# Patient Record
Sex: Female | Born: 1965
Health system: Southern US, Community
[De-identification: ages and names within clinical notes are randomized; demographics above are authoritative.]

## PROBLEM LIST (undated history)

## (undated) DIAGNOSIS — E7800 Pure hypercholesterolemia, unspecified: Secondary | ICD-10-CM

## (undated) DIAGNOSIS — I10 Essential (primary) hypertension: Secondary | ICD-10-CM

## (undated) DIAGNOSIS — M1990 Unspecified osteoarthritis, unspecified site: Secondary | ICD-10-CM

## (undated) DIAGNOSIS — M23305 Other meniscus derangements, unspecified medial meniscus, unspecified knee: Secondary | ICD-10-CM

## (undated) DIAGNOSIS — D510 Vitamin B12 deficiency anemia due to intrinsic factor deficiency: Secondary | ICD-10-CM

## (undated) DIAGNOSIS — M069 Rheumatoid arthritis, unspecified: Secondary | ICD-10-CM

## (undated) DIAGNOSIS — G039 Meningitis, unspecified: Secondary | ICD-10-CM

## (undated) DIAGNOSIS — E669 Obesity, unspecified: Secondary | ICD-10-CM

## (undated) DIAGNOSIS — K760 Fatty (change of) liver, not elsewhere classified: Secondary | ICD-10-CM

## (undated) DIAGNOSIS — J45909 Unspecified asthma, uncomplicated: Secondary | ICD-10-CM

## (undated) DIAGNOSIS — D649 Anemia, unspecified: Secondary | ICD-10-CM

## (undated) DIAGNOSIS — N182 Chronic kidney disease, stage 2 (mild): Secondary | ICD-10-CM

## (undated) DIAGNOSIS — M2550 Pain in unspecified joint: Secondary | ICD-10-CM

## (undated) DIAGNOSIS — E538 Deficiency of other specified B group vitamins: Secondary | ICD-10-CM

## (undated) DIAGNOSIS — E559 Vitamin D deficiency, unspecified: Secondary | ICD-10-CM

## (undated) HISTORY — DX: Unspecified osteoarthritis, unspecified site: M19.90

## (undated) HISTORY — DX: Other meniscus derangements, unspecified medial meniscus, unspecified knee: M23.305

## (undated) HISTORY — DX: Unspecified asthma, uncomplicated: J45.909

## (undated) HISTORY — DX: Chronic kidney disease, stage 2 (mild): N18.2

## (undated) HISTORY — DX: Vitamin B12 deficiency anemia due to intrinsic factor deficiency: D51.0

## (undated) HISTORY — DX: Essential (primary) hypertension: I10

## (undated) HISTORY — DX: Pain in unspecified joint: M25.50

## (undated) HISTORY — DX: Obesity, unspecified: E66.9

## (undated) HISTORY — PX: TONSILLECTOMY: SHX5217

## (undated) HISTORY — DX: Pure hypercholesterolemia, unspecified: E78.00

## (undated) HISTORY — DX: Rheumatoid arthritis, unspecified: M06.9

## (undated) HISTORY — DX: Deficiency of other specified B group vitamins: E53.8

## (undated) HISTORY — DX: Anemia, unspecified: D64.9

## (undated) HISTORY — PX: FOOT SURGERY: SHX648

## (undated) HISTORY — DX: Fatty (change of) liver, not elsewhere classified: K76.0

## (undated) HISTORY — DX: Meningitis, unspecified: G03.9

## (undated) HISTORY — PX: BREAST EXCISIONAL BIOPSY: SUR124

---

## 1992-08-14 HISTORY — PX: ABDOMINAL HYSTERECTOMY: SHX81

## 1992-08-14 HISTORY — PX: PARTIAL HYSTERECTOMY: SHX80

## 2000-09-04 ENCOUNTER — Other Ambulatory Visit: Admission: RE | Admit: 2000-09-04 | Discharge: 2000-09-04 | Payer: Self-pay | Admitting: *Deleted

## 2001-08-14 HISTORY — PX: BREAST EXCISIONAL BIOPSY: SUR124

## 2001-08-14 HISTORY — PX: BREAST SURGERY: SHX581

## 2004-05-11 ENCOUNTER — Encounter: Payer: Self-pay | Admitting: Family

## 2004-05-11 LAB — CONVERTED CEMR LAB: Pap Smear: NORMAL

## 2008-08-14 DIAGNOSIS — G039 Meningitis, unspecified: Secondary | ICD-10-CM

## 2008-08-14 HISTORY — DX: Meningitis, unspecified: G03.9

## 2010-07-01 ENCOUNTER — Ambulatory Visit: Payer: Self-pay | Admitting: Family

## 2010-07-01 DIAGNOSIS — E669 Obesity, unspecified: Secondary | ICD-10-CM

## 2010-07-01 DIAGNOSIS — E66813 Obesity, class 3: Secondary | ICD-10-CM | POA: Insufficient documentation

## 2010-07-04 ENCOUNTER — Encounter: Payer: Self-pay | Admitting: Family

## 2010-07-04 LAB — CONVERTED CEMR LAB
Folate: 6.1 ng/mL
Iron: 51 ug/dL (ref 42–145)
Saturation Ratios: 19 % — ABNORMAL LOW (ref 20–55)
Vitamin B-12: 129 pg/mL — ABNORMAL LOW (ref 211–911)

## 2010-07-05 ENCOUNTER — Telehealth: Payer: Self-pay | Admitting: Family

## 2010-07-05 DIAGNOSIS — D510 Vitamin B12 deficiency anemia due to intrinsic factor deficiency: Secondary | ICD-10-CM

## 2010-07-12 ENCOUNTER — Ambulatory Visit: Payer: Self-pay | Admitting: Family

## 2010-07-12 ENCOUNTER — Ambulatory Visit: Payer: Self-pay | Admitting: Diagnostic Radiology

## 2010-07-12 ENCOUNTER — Ambulatory Visit (HOSPITAL_BASED_OUTPATIENT_CLINIC_OR_DEPARTMENT_OTHER)
Admission: RE | Admit: 2010-07-12 | Discharge: 2010-07-12 | Payer: Self-pay | Source: Home / Self Care | Admitting: Internal Medicine

## 2010-07-12 LAB — CONVERTED CEMR LAB
Basophils Relative: 1 % (ref 0–1)
Eosinophils Absolute: 0.1 10*3/uL (ref 0.0–0.7)
Lymphs Abs: 2.5 10*3/uL (ref 0.7–4.0)
MCHC: 32.2 g/dL (ref 30.0–36.0)
MCV: 92.7 fL (ref 78.0–100.0)
Neutro Abs: 4.4 10*3/uL (ref 1.7–7.7)
Neutrophils Relative %: 59 % (ref 43–77)
Platelets: 295 10*3/uL (ref 150–400)
RBC: 3.82 M/uL — ABNORMAL LOW (ref 3.87–5.11)
Vitamin B-12: 152 pg/mL — ABNORMAL LOW (ref 211–911)
WBC: 7.5 10*3/uL (ref 4.0–10.5)

## 2010-07-19 ENCOUNTER — Telehealth: Payer: Self-pay | Admitting: Family

## 2010-07-19 ENCOUNTER — Encounter: Payer: Self-pay | Admitting: Family

## 2010-07-20 ENCOUNTER — Encounter
Admission: RE | Admit: 2010-07-20 | Discharge: 2010-07-20 | Payer: Self-pay | Source: Home / Self Care | Admitting: Internal Medicine

## 2010-07-20 ENCOUNTER — Encounter: Payer: Self-pay | Admitting: Family

## 2010-07-21 ENCOUNTER — Ambulatory Visit: Payer: Self-pay | Admitting: Family

## 2010-07-21 LAB — CONVERTED CEMR LAB: Fecal Occult Bld: NEGATIVE

## 2010-07-22 ENCOUNTER — Encounter: Payer: Self-pay | Admitting: Family

## 2010-08-11 ENCOUNTER — Ambulatory Visit: Payer: Self-pay | Admitting: Family

## 2010-09-09 ENCOUNTER — Ambulatory Visit: Admit: 2010-09-09 | Payer: Self-pay | Admitting: Family

## 2010-09-09 ENCOUNTER — Encounter: Payer: Self-pay | Admitting: Family

## 2010-09-11 LAB — CONVERTED CEMR LAB
ALT: 9 units/L (ref 0–35)
Alkaline Phosphatase: 88 units/L (ref 39–117)
BUN: 11 mg/dL (ref 6–23)
Basophils Relative: 1 % (ref 0–1)
Bilirubin, Direct: 0.1 mg/dL (ref 0.0–0.3)
Calcium: 9.4 mg/dL (ref 8.4–10.5)
Cholesterol: 173 mg/dL (ref 0–200)
Creatinine, Ser: 0.9 mg/dL (ref 0.40–1.20)
Eosinophils Absolute: 0.1 10*3/uL (ref 0.0–0.7)
Eosinophils Relative: 2 % (ref 0–5)
Glucose, Bld: 94 mg/dL (ref 70–99)
HCT: 36.2 % (ref 36.0–46.0)
Indirect Bilirubin: 0.3 mg/dL (ref 0.0–0.9)
Lymphs Abs: 2.9 10*3/uL (ref 0.7–4.0)
MCHC: 31.8 g/dL (ref 30.0–36.0)
MCV: 95 fL (ref 78.0–100.0)
Monocytes Absolute: 0.4 10*3/uL (ref 0.1–1.0)
Monocytes Relative: 6 % (ref 3–12)
Neutrophils Relative %: 52 % (ref 43–77)
RBC: 3.81 M/uL — ABNORMAL LOW (ref 3.87–5.11)
Total Protein: 6.8 g/dL (ref 6.0–8.3)
Triglycerides: 53 mg/dL (ref <150)
VLDL: 11 mg/dL (ref 0–40)
WBC: 7.3 10*3/uL (ref 4.0–10.5)

## 2010-09-13 NOTE — Miscellaneous (Signed)
Summary: pap smear  Clinical Lists Changes  Observations: Added new observation of PAP SMEAR: normal (05/11/2004 11:51)      Preventive Care Screening  Pap Smear:    Date:  05/11/2004    Results:  normal

## 2010-09-13 NOTE — Letter (Signed)
   [place] at [facility] [address] [place], [place]  [address]  [place] Phone: [phone]      July 22, 2010   [facility] [address] [place], [place] [address]  RE:  LAB RESULTS  Dear  Ms. GILMER,  The following is an interpretation of your most recent lab tests.  Please take note of any instructions provided or changes to medications that have resulted from your lab work.  Hemoccult Test for blood in stool:  negative   There was no hidden blood in the stool sample that you provided.   Sincerely Yours,    Lemont Fillers FNP  Appended Document:  Mailed.

## 2010-09-13 NOTE — Miscellaneous (Signed)
Summary: tetanus vaccine  Clinical Lists Changes  Orders: Added new Service order of Tdap => 6yrs IM (52841) - Signed Added new Service order of Admin 1st Vaccine (32440) - Signed Observations: Added new observation of TD BOOST VIS: 07/01/08 version given July 01, 2010. (07/01/2010 13:46) Added new observation of TD BOOSTERLO: [id number] (07/01/2010 13:46) Added new observation of TD BOOST EXP: 06/02/2012 (07/01/2010 13:46) Added new observation of TD BOOSTERBY: Tricia Fergerson CMA (AAMA) (07/01/2010 13:46) Added new observation of TD BOOSTERRT: IM (07/01/2010 13:46) Added new observation of TDBOOSTERDSE: 0.5 ml (07/01/2010 13:46) Added new observation of TD BOOSTERMF: Gl[employer]oSmith[employer] (07/01/2010 13:46) Added new observation of TD BOOST SIT: right deltoid (07/01/2010 13:46) Added new observation of TD BOOSTER: Tdap (07/01/2010 13:46)      Orders Added: 1)  Tdap => 80yrs IM [40347] 2)  Admin 1st Vaccine [42595]    Immunizations Administered:  Tetanus Vaccine:    Vaccine Type: Tdap    Site: right deltoid    Mfr: GlaxoSmithKline    Dose: 0.5 ml    Route: IM    Given by: Mervin Kung CMA (AAMA)    Exp. Date: 06/02/2012    Lot #: [id number]    VIS given: 07/01/08 version given July 01, 2010.

## 2010-09-13 NOTE — Miscellaneous (Signed)
  Clinical Lists Changes  Problems: Added new problem of UNSPECIFIED ANEMIA (ICD-285.9)

## 2010-09-13 NOTE — Progress Notes (Signed)
Summary: lab results, b12 inj.  Phone Note Outgoing Call   Summary of Call: Please call patient and let her know that she is mildly anemic and has a b12 deficiency.  I would like for her to add a MVI with iron and start monthly B12 injections please. Needs f/u B12 level and CBC in 3 months please. (281.0).  Keep f/u apt in May. Initial call taken by: Lemont Fillers FNP,  July 05, 2010 8:33 AM  Follow-up for Phone Call        Pt notified. Lab appt set for the week of 10/03/09. Order faxed to lab.  Monthly B12 injs have been scheduled for the next 3 months and reminders have been mailed to pt. Nicki Guadalajara Fergerson CMA Duncan Dull)  July 05, 2010 9:14 AM   New Problems: ANEMIA, PERNICIOUS (ICD-281.0)   New Problems: ANEMIA, PERNICIOUS (ICD-281.0) New/Updated Medications: WOMENS MULTIVITAMIN PLUS  TABS (MULTIPLE VITAMINS-MINERALS) one tablet by mouth daily CYANOCOBALAMIN 1000 MCG/ML SOLN (CYANOCOBALAMIN) IM once monthly

## 2010-09-13 NOTE — Assessment & Plan Note (Signed)
Summary: Give pt IFOB and b 12 inj / tf,cma  Nurse Visit  CC: Pt here for B12 injection per order of 07/05/10. Has B12 injections until 09/2010. IFOB kit explained and given to pt.   Allergies: No Known Drug Allergies  Medication Administration  Injection # 1:    Medication: Vit B12 1000 mcg    Diagnosis: ANEMIA, PERNICIOUS (ICD-281.0)    Route: IM    Site: L deltoid    Exp Date: 10/12/2011    Lot #: [id number]    Mfr: [employer]    Patient tolerated injection without complications    Given by: Mervin Kung CMA Duncan Dull) (July 12, 2010 9:51 AM)  Orders Added: 1)  Vit B12 1000 mcg [J3420] 2)  Admin of Therapeutic Inj  intramuscular or subcutaneous [57846]

## 2010-09-13 NOTE — Progress Notes (Signed)
Summary: Rec'd records from [facility]  Phone Note Other Incoming   Summary of Call: Records received from [facility]. Nicki Guadalajara Fergerson CMA Duncan Dull)  July 19, 2010 11:51 AM

## 2010-09-13 NOTE — Letter (Signed)
Summary: [facility]: Immunoassay Fecal Occult Blood (iFOB) Order Psychologist, counselling at [facility]  [address]   [place], [place] [address]   Phone: [phone]  Fax: [phone]      [facility]: Immunoassay Fecal Occult Blood (iFOB) Order Form   July 12, 2010 MRN: [id number]   SHAVONTE ZHAO 22-Aug-1965   Phys[place]ican Name:____Melissa Peggyann Juba, NP__  Diagnosis Code:______281.0____________________      Mervin Kung CMA (AAMA)  Appended Document: [facility]: Immunoassay Fecal Occult Blood (iFOB) Order Form Faxed at 9:52.

## 2010-09-13 NOTE — Assessment & Plan Note (Signed)
Summary: new pt est care wants complete cpx/dt--Rm 5   Vital Signs:  Patient profile:   45 year old female Menstrual status:  partial hysterectomy LMP:     08/14/1992 Height:      65.5 inches Weight:      246.50 pounds BMI:     40.54 Temp:     98.4 degrees F oral Pulse rate:   66 / minute Pulse rhythm:   regular Resp:     18 per minute BP sitting:   116 / 82  (right arm) Cuff size:   large  Vitals Entered By: Joy [name] CMA Joy [name]) (July 01, 2010 8:43 AM) CC: New pt to establish care.  Fasting, would like physical today. Is Patient Diabetic? No Pain Assessment Patient in pain? no      LMP (date): 08/14/1992     Menstrual Status partial hysterectomy Enter LMP: 08/14/1992   CC:  New pt to establish care.  Fasting and would like physical today.Marland Kitchen  History of Present Illness: Ms Joy [name] is a 45 year old female who presents today to establish care.  She reports that she has not had a GYN or PCP in 3-4 years.    Preventative-  Declines flu shot.  Tetanus was greater than 10 years ago.  S/p partial hysterectomy.  Last mammogram was 2003.  Does not exercise regularly.  Diet- eats once a day.  Rare snacks.  Lots of regular soda.  3 cans or bottles a day.    Preventive Screening-Counseling & Management  Alcohol-Tobacco     Alcohol drinks/day: 0     Smoking Status: never  Caffeine-Diet-Exercise     Caffeine use/day: 3 sodas daily     Does Patient Exercise: no  Comments: Pt states she walks a lot on her job.  Allergies (verified): No Known Drug Allergies  Past History:  Past Medical History: Meningitis 2010 (hospitalized)  Past Surgical History: Partial Hysterectomy-- 1994 Tonsillectomy Biopsy left breast, benign-- 2003  Family History: Mother--76  living, lung mass?,  blind (stroke related in L eye, Glaucoma caused blindness in the right eye) Father-- deceased, history unknown MGM-- colon cancer in 66's. Maternal Aunt-- ovarian cancer mid 42's 2  Maternal Aunt-- Diabetes 2 children-- A & W Joy [name]- 1987 Joy [name]- 1989  Social History: Occupation: Human resources officer for [employer] Joy [name] Married to [employer]  Never Smoked Alcohol use-no Regular exercise-no Denies drug useSmoking Status:  never Caffeine use/day:  3 sodas daily Does Patient Exercise:  no  Review of Systems       Constitutional: Denies Fever ENT:  Denies nasal congestion or sore throat. Resp: Denies cough CV:  Denies Chest Pain GI:  Denies nausea or vomitting GU: Denies dysuria Lymphatic: Denies lymphadenopathy Musculoskeletal:  occasional stiffness in knees and hands Skin:  Denies Rashes Psychiatric: Denies depression or anxiety Neuro: Denies numbness     Physical Exam  General:  Well-developed,well-nourished,in no acute distress; alert,appropriate and cooperative throughout examination Head:  Normocephalic and atraumatic without obvious abnormalities. No apparent alopecia or balding. Eyes:  PERRLA Ears:  External ear exam shows no significant lesions or deformities.  Otoscopic examination reveals clear canals, tympanic membranes are intact bilaterally without bulging, retraction, inflammation or discharge. Hearing is grossly normal bilaterally. Mouth:  Oral mucosa and oropharynx without lesions or exudates.  Teeth in good repair. Neck:  No deformities, masses, or tenderness noted. Breasts:  + scar noted on left breast a 11 oclock and 5 oclock.  healed hyperpigmented area of former abscess closer to  nipple at 4 oclock.  Otherwise normal breast exam bilaterally Lungs:  Normal respiratory effort, chest expands symmetrically. Lungs are clear to auscultation, no crackles or wheezes. Heart:  Normal rate and regular rhythm. S1 and S2 normal without gallop, murmur, click, rub or other extra sounds. Abdomen:  Bowel sounds positive,abdomen soft and non-tender without masses, organomegaly or hernias noted. Genitalia:  Pelvic Exam:        External: normal  female genitalia without lesions or masses        Vagina: normal without lesions or masses        Cervix: surgically absent        Adnexa: normal bimanual exam without masses or fullness        Uterus: surgically absent        Pap smear: not performed   Impression & Recommendations:  Problem # 1:  PHYSICAL EXAMINATION (ICD-V70.0) Assessment New Patient was counseled on diet, exercise, weight loss.  Tetanus today.  Ordered fasting labs and screening mammogram.  Declines flu shot.  Orders: TLB-BMP (Basic Metabolic Panel-BMET) (80048-METABOL) TLB-CBC Platelet - w/Differential (85025-CBCD) TLB-Hepatic/Liver Function Pnl (80076-HEPATIC) TLB-TSH (Thyroid Stimulating Hormone) (84443-TSH) TLB-Lipid Panel (80061-LIPID) Mammogram (Screening) (Mammo)  Patient Instructions: 1)  It is important that you exercise regularly at least 20 minutes 5 times a week. If you develop chest pain, have severe difficulty breathing, or feel very tired , stop exercising immediately and seek medical attention. 2)  Please follow up in 6 months. 3)  Welcome to [facility]!   Orders Added: 1)  TLB-BMP (Basic Metabolic Panel-BMET) [80048-METABOL] 2)  TLB-CBC Platelet - w/Differential [85025-CBCD] 3)  TLB-Hepatic/Liver Function Pnl [80076-HEPATIC] 4)  TLB-TSH (Thyroid Stimulating Hormone) [84443-TSH] 5)  TLB-Lipid Panel [80061-LIPID] 6)  Mammogram (Screening) [Mammo] 7)  New Patient 40-64 years [99386]    Current Allergies (reviewed today): No known allergies    Preventive Care Screening  Mammogram:    Date:  06/14/2002    Results:  normal      Last tetanus > 10 yrs, Last pap smear in the ?2000; normal.  Joy [name] CMA Joy [name])  July 01, 2010 8:56 AM    Contraindications/Deferment of Procedures/Staging:    Test/Procedure: FLU VAX    Reason for deferment: patient declined       July 01, 2010   Joy [name] [address] [place], [place] [address]  RE:  LAB RESULTS  Dear   [name],  The following is an interpretation of your most recent lab tests.  Please take note of any instructions provided or changes to medications that have resulted from your lab work.

## 2010-09-15 NOTE — Letter (Signed)
Summary: Records Dated 05-11-04 thru 08-12-05/[facility]  Records Dated 05-11-04 thru 08-12-05/[facility]   Imported By: Lanelle Bal 08/01/2010 08:43:19  _____________________________________________________________________  External Attachment:    Type:   Image     Comment:   External Document

## 2010-09-15 NOTE — Assessment & Plan Note (Signed)
Summary: nurse visit for b12 inj  Nurse Visit   Allergies: No Known Drug Allergies  Medication Administration  Injection # 1:    Medication: Vit B12 1000 mcg    Diagnosis: ANEMIA, PERNICIOUS (ICD-281.0)    Route: IM    Site: R deltoid    Exp Date: 12/12/2011    Lot #: [id number]    Mfr: [employer]    Patient tolerated injection without complications    Given by: Mervin Kung CMA (AAMA) (September 09, 2010 11:01 AM)  Orders Added: 1)  Admin of Therapeutic Inj  intramuscular or subcutaneous [96372] 2)  Vit B12 1000 mcg [J3420]

## 2010-10-06 ENCOUNTER — Ambulatory Visit: Payer: Self-pay

## 2010-10-07 ENCOUNTER — Ambulatory Visit: Payer: Self-pay

## 2010-10-24 ENCOUNTER — Emergency Department (HOSPITAL_BASED_OUTPATIENT_CLINIC_OR_DEPARTMENT_OTHER)
Admission: EM | Admit: 2010-10-24 | Discharge: 2010-10-24 | Disposition: A | Discharge status: Home / Self Care | Payer: 59 | Attending: Emergency Medicine | Admitting: Emergency Medicine

## 2010-10-24 DIAGNOSIS — X58XXXA Exposure to other specified factors, initial encounter: Secondary | ICD-10-CM | POA: Insufficient documentation

## 2010-10-24 DIAGNOSIS — IMO0002 Reserved for concepts with insufficient information to code with codable children: Secondary | ICD-10-CM | POA: Insufficient documentation

## 2010-12-22 ENCOUNTER — Encounter: Payer: Self-pay | Admitting: Family

## 2010-12-30 ENCOUNTER — Ambulatory Visit: Payer: Self-pay | Admitting: Family

## 2010-12-30 DIAGNOSIS — Z0289 Encounter for other administrative examinations: Secondary | ICD-10-CM

## 2013-01-13 ENCOUNTER — Encounter: Payer: Self-pay | Admitting: Family

## 2013-04-18 ENCOUNTER — Encounter: Payer: Self-pay | Admitting: Physician Assistant

## 2013-04-18 ENCOUNTER — Ambulatory Visit (INDEPENDENT_AMBULATORY_CARE_PROVIDER_SITE_OTHER): Payer: 59 | Admitting: Physician Assistant

## 2013-04-18 VITALS — BP 122/80 | HR 90 | Temp 98.4°F | Resp 16 | Ht 65.0 in | Wt 254.8 lb

## 2013-04-18 DIAGNOSIS — N390 Urinary tract infection, site not specified: Secondary | ICD-10-CM

## 2013-04-18 DIAGNOSIS — R350 Frequency of micturition: Secondary | ICD-10-CM

## 2013-04-18 DIAGNOSIS — R319 Hematuria, unspecified: Secondary | ICD-10-CM

## 2013-04-18 LAB — POCT URINALYSIS DIPSTICK
Glucose, UA: NEGATIVE
Ketones, UA: NEGATIVE
Spec Grav, UA: >=1.03

## 2013-04-18 MED ORDER — NITROFURANTOIN MONOHYD MACRO 100 MG PO CAPS: 100.0000 mg | ORAL_CAPSULE | Freq: Two times a day (BID) | ORAL | Status: DC

## 2013-04-18 NOTE — Progress Notes (Signed)
Patient ID: Joy [name], female   DOB: 09/18/1965, 47 y.o.   MRN: [id number]  Patient presents to clinic today c/o urinary urgency, frequency, dysuria and suprapubic pressure x 4 days.  Patient denies fever, chills, flank pain, hematuria.  Denies N/V, abdominal pain, C/D.  No hx of kidney stones.  No hx of recurrent UTIs.  Past Medical History  Diagnosis Date  . Meningitis 2010    hospitalized    No current outpatient prescriptions on file prior to visit.   No current facility-administered medications on file prior to visit.    No Known Allergies  Family History  Problem Relation Age of Onset  . Stroke Mother   . Blindness Mother     in left eye due to stroke  . Glaucoma Mother     caused blindness of right eye  . Cancer Maternal Aunt 60    ovarian  . Cancer Maternal Grandmother 60    colon  . Diabetes Maternal Aunt   . Diabetes Maternal Aunt     History   Social History  . Marital Status: Married    Spouse Name: Yolanda Manges    Number of Children: 2  . Years of Education: N/A   Occupational History  . ORDER PROCESSOR    Social History Main Topics  . Smoking status: Never Smoker   . Smokeless tobacco: Never Used  . Alcohol Use: No  . Drug Use: No  . Sexual Activity: None   Other Topics Concern  . None   Social History Narrative   Regular exercise:  No          Review of Systems  Constitutional: Negative for fever, chills and malaise/fatigue.  Gastrointestinal: Negative for nausea, vomiting, abdominal pain, diarrhea, constipation, blood in stool and melena.  Genitourinary: Positive for dysuria, urgency and frequency. Negative for hematuria and flank pain.  Musculoskeletal: Negative for myalgias and back pain.   Filed Vitals:   04/18/13 1349  BP: 122/80  Pulse: 90  Temp: 98.4 F (36.9 C)  Resp: 16    Physical Exam  Constitutional: She is oriented to person, place, and time and well-developed, well-nourished, and in no distress.  HENT:   Head: Normocephalic and atraumatic.  Eyes: Pupils are equal, round, and reactive to light.  Abdominal: Soft. Bowel sounds are normal. She exhibits no distension and no mass. There is no tenderness. There is no rebound and no guarding.  Musculoskeletal: Normal range of motion.  Neurological: She is alert and oriented to person, place, and time.  Skin: Skin is warm and dry. No rash noted.   Diagnostics: Urine Dipstick -- large blood, proteinuria, and moderate leukocytes.   Assessment/Plan: UTI (urinary tract infection) Will send urine for micro and culture.  Rx macrobid BID x 5 days.  Increase fluid intake.  AZO. Cranberry supplement.

## 2013-04-18 NOTE — Assessment & Plan Note (Signed)
Will send urine for micro and culture.  Rx macrobid BID x 5 days.  Increase fluid intake.  AZO. Cranberry supplement.

## 2013-04-18 NOTE — Patient Instructions (Signed)
Take antibiotic as prescribed until all pills are gone.  Please drink plenty of fluids.  I encourage cranberry tablet daily and an AZO tablet to help with urgency and burning with urination.  Please call or return to clinic if symptoms are not better when antibiotic regimen is complete.  Urinary Tract Infection Urinary tract infections (UTIs) can develop anywhere along your urinary tract. Your urinary tract is your body's drainage system for removing wastes and extra water. Your urinary tract includes two kidneys, two ureters, a bladder, and a urethra. Your kidneys are a pair of bean-shaped organs. Each kidney is about the size of your fist. They are located below your ribs, one on each side of your spine. CAUSES Infections are caused by microbes, which are microscopic organisms, including fungi, viruses, and bacteria. These organisms are so small that they can only be seen through a microscope. Bacteria are the microbes that most commonly cause UTIs. SYMPTOMS  Symptoms of UTIs may vary by age and gender of the patient and by the location of the infection. Symptoms in young women typically include a frequent and intense urge to urinate and a painful, burning feeling in the bladder or urethra during urination. Older women and men are more likely to be tired, shaky, and weak and have muscle aches and abdominal pain. A fever may mean the infection is in your kidneys. Other symptoms of a kidney infection include pain in your back or sides below the ribs, nausea, and vomiting. DIAGNOSIS To diagnose a UTI, your caregiver will ask you about your symptoms. Your caregiver also will ask to provide a urine sample. The urine sample will be tested for bacteria and white blood cells. White blood cells are made by your body to help fight infection. TREATMENT  Typically, UTIs can be treated with medication. Because most UTIs are caused by a bacterial infection, they usually can be treated with the use of antibiotics. The  choice of antibiotic and length of treatment depend on your symptoms and the type of bacteria causing your infection. HOME CARE INSTRUCTIONS  If you were prescribed antibiotics, take them exactly as your caregiver instructs you. Finish the medication even if you feel better after you have only taken some of the medication.  Drink enough water and fluids to keep your urine clear or pale yellow.  Avoid caffeine, tea, and carbonated beverages. They tend to irritate your bladder.  Empty your bladder often. Avoid holding urine for long periods of time.  Empty your bladder before and after sexual intercourse.  After a bowel movement, women should cleanse from front to back. Use each tissue only once. SEEK MEDICAL CARE IF:   You have back pain.  You develop a fever.  Your symptoms do not begin to resolve within 3 days. SEEK IMMEDIATE MEDICAL CARE IF:   You have severe back pain or lower abdominal pain.  You develop chills.  You have nausea or vomiting.  You have continued burning or discomfort with urination. MAKE SURE YOU:   Understand these instructions.  Will watch your condition.  Will get help right away if you are not doing well or get worse. Document Released: 05/10/2005 Document Revised: 01/30/2012 Document Reviewed: 09/08/2011 [facility] Patient Information 2014 [place], [place].

## 2013-05-02 ENCOUNTER — Encounter: Payer: 59 | Admitting: Family

## 2013-08-14 DIAGNOSIS — M1990 Unspecified osteoarthritis, unspecified site: Secondary | ICD-10-CM

## 2013-08-14 DIAGNOSIS — M069 Rheumatoid arthritis, unspecified: Secondary | ICD-10-CM

## 2013-08-14 HISTORY — DX: Unspecified osteoarthritis, unspecified site: M19.90

## 2013-08-14 HISTORY — DX: Rheumatoid arthritis, unspecified: M06.9

## 2013-08-29 ENCOUNTER — Emergency Department (HOSPITAL_BASED_OUTPATIENT_CLINIC_OR_DEPARTMENT_OTHER)
Admission: EM | Admit: 2013-08-29 | Discharge: 2013-08-29 | Disposition: A | Discharge status: Home / Self Care | Payer: 59 | Attending: Emergency Medicine | Admitting: Emergency Medicine

## 2013-08-29 ENCOUNTER — Encounter (HOSPITAL_BASED_OUTPATIENT_CLINIC_OR_DEPARTMENT_OTHER): Payer: Self-pay | Admitting: Emergency Medicine

## 2013-08-29 DIAGNOSIS — L03319 Cellulitis of trunk, unspecified: Principal | ICD-10-CM

## 2013-08-29 DIAGNOSIS — L0390 Cellulitis, unspecified: Secondary | ICD-10-CM

## 2013-08-29 DIAGNOSIS — L02219 Cutaneous abscess of trunk, unspecified: Secondary | ICD-10-CM | POA: Insufficient documentation

## 2013-08-29 DIAGNOSIS — Z8669 Personal history of other diseases of the nervous system and sense organs: Secondary | ICD-10-CM | POA: Insufficient documentation

## 2013-08-29 MED ORDER — DOXYCYCLINE HYCLATE 100 MG PO CAPS: 100.0000 mg | ORAL_CAPSULE | Freq: Two times a day (BID) | ORAL | Status: DC

## 2013-08-29 MED ORDER — HYDROCODONE-ACETAMINOPHEN 5-325 MG PO TABS: 1.0000 | ORAL_TABLET | Freq: Four times a day (QID) | ORAL | Status: DC | PRN

## 2013-08-29 MED ORDER — OXYCODONE-ACETAMINOPHEN 5-325 MG PO TABS
1.0000 | ORAL_TABLET | Freq: Once | ORAL | Status: AC
  Administered 2013-08-29: 1 via ORAL
  Filled 2013-08-29: qty 1

## 2013-08-29 MED ORDER — DOXYCYCLINE HYCLATE 100 MG PO TABS
100.0000 mg | ORAL_TABLET | Freq: Once | ORAL | Status: AC
  Administered 2013-08-29: 100 mg via ORAL
  Filled 2013-08-29: qty 1

## 2013-08-29 NOTE — Discharge Instructions (Signed)
Cellulitis °Cellulitis is an infection of the skin and the tissue under the skin. The infected area is usually red and tender. This happens most often in the arms and lower legs. °HOME CARE  °· Take your antibiotic medicine as told. Finish the medicine even if you start to feel better. °· Keep the infected arm or leg raised (elevated). °· Put a warm cloth on the area up to 4 times per day. °· Only take medicines as told by your doctor. °· Keep all doctor visits as told. °GET HELP RIGHT AWAY IF:  °· You have a fever. °· You feel very sleepy. °· You throw up (vomit) or have watery poop (diarrhea). °· You feel sick and have muscle aches and pains. °· You see red streaks on the skin coming from the infected area. °· Your red area gets bigger or turns a dark color. °· Your bone or joint under the infected area is painful after the skin heals. °· Your infection comes back in the same area or different area. °· You have a puffy (swollen) bump in the infected area. °· You have new symptoms. °MAKE SURE YOU:  °· Understand these instructions. °· Will watch your condition. °· Will get help right away if you are not doing well or get worse. °Document Released: 01/17/2008 Document Revised: 01/30/2012 Document Reviewed: 10/16/2011 °ExitCare® Patient Information ©2014 [employer]. ° °

## 2013-08-29 NOTE — ED Provider Notes (Signed)
CSN: [id number]     Arrival date & time 08/29/13  0231 History   First MD Initiated Contact with Patient 08/29/13 0305     Chief Complaint  Patient presents with  . Abscess   (Consider location/radiation/quality/duration/timing/severity/associated sxs/prior Treatment) Patient is a 48 y.o. female presenting with abscess. The history is provided by the patient.  Abscess Location:  Torso Torso abscess location: lower abdomen midline. Abscess quality: draining and redness   Red streaking: no   Duration:  2 days Progression:  Worsening Chronicity:  New Context: not diabetes   Relieved by:  Nothing Worsened by:  Nothing tried Ineffective treatments:  Draining/squeezing Associated symptoms: no anorexia, no fever, no nausea and no vomiting   Risk factors: no family hx of MRSA     Past Medical History  Diagnosis Date  . Meningitis 2010    hospitalized   Past Surgical History  Procedure Laterality Date  . Abdominal hysterectomy  1994    partial  . Tonsillectomy    . Breast surgery  2003    BIOPSY LEFT BREAST--BENIGN   Family History  Problem Relation Age of Onset  . Stroke Mother   . Blindness Mother     in left eye due to stroke  . Glaucoma Mother     caused blindness of right eye  . Cancer Maternal Aunt 60    ovarian  . Cancer Maternal Grandmother 60    colon  . Diabetes Maternal Aunt   . Diabetes Maternal Aunt    History  Substance Use Topics  . Smoking status: Never Smoker   . Smokeless tobacco: Never Used  . Alcohol Use: No   OB History   Grav Para Term Preterm Abortions TAB SAB Ect Mult Living                 Review of Systems  Constitutional: Negative for fever.  Gastrointestinal: Negative for nausea, vomiting and anorexia.  All other systems reviewed and are negative.    Allergies  Review of patient's allergies indicates no known allergies.  Home Medications  No current outpatient prescriptions on file. BP 124/84  Pulse 84  Temp(Src) 98.6 F  (37 C) (Oral)  Resp 16  Ht 5\' 5"  (1.651 m)  Wt 248 lb 3 oz (112.577 kg)  BMI 41.30 kg/m2  SpO2 99% Physical Exam  Constitutional: She is oriented to person, place, and time. She appears well-developed and well-nourished.  HENT:  Head: Normocephalic and atraumatic.  Mouth/Throat: Oropharynx is clear and moist.  Eyes: Conjunctivae are normal. Pupils are equal, round, and reactive to light.  Neck: Normal range of motion. Neck supple.  Cardiovascular: Normal rate, regular rhythm and intact distal pulses.   Pulmonary/Chest: Effort normal and breath sounds normal. She has no wheezes. She has no rales. She exhibits no tenderness.  Abdominal: Soft. Bowel sounds are normal. There is no tenderness. There is no rebound and no guarding.  Musculoskeletal: Normal range of motion.  Neurological: She is alert and oriented to person, place, and time.  Skin: Skin is warm and dry. There is erythema.     Psychiatric: She has a normal mood and affect.    ED Course  Procedures (including critical care time) Labs Review Labs Reviewed - No data to display Imaging Review No results found.  EKG Interpretation   None       MDM  No diagnosis found. Already decompressed, will treat as cellulitis   Tyrene Nader K Kwamaine Cuppett-Rasch, MD 08/29/13 [phone]

## 2013-08-29 NOTE — ED Notes (Signed)
States had a white bump on abd x 2 days ago ,mashed it  And now red and inflammed

## 2013-09-10 ENCOUNTER — Ambulatory Visit: Payer: 59 | Admitting: Family

## 2013-09-10 DIAGNOSIS — Z0289 Encounter for other administrative examinations: Secondary | ICD-10-CM

## 2013-09-26 ENCOUNTER — Other Ambulatory Visit: Payer: Self-pay | Admitting: Family

## 2013-09-26 ENCOUNTER — Ambulatory Visit (INDEPENDENT_AMBULATORY_CARE_PROVIDER_SITE_OTHER): Payer: 59 | Admitting: Family

## 2013-09-26 ENCOUNTER — Encounter: Payer: Self-pay | Admitting: Family

## 2013-09-26 VITALS — BP 130/80 | HR 76 | Temp 97.9°F | Resp 16 | Ht 65.0 in | Wt 247.1 lb

## 2013-09-26 DIAGNOSIS — M2550 Pain in unspecified joint: Secondary | ICD-10-CM

## 2013-09-26 DIAGNOSIS — Z0000 Encounter for general adult medical examination without abnormal findings: Secondary | ICD-10-CM | POA: Insufficient documentation

## 2013-09-26 LAB — BASIC METABOLIC PANEL WITH GFR
BUN: 12 mg/dL (ref 6–23)
CALCIUM: 9 mg/dL (ref 8.4–10.5)
CO2: 28 mEq/L (ref 19–32)
CREATININE: 0.76 mg/dL (ref 0.50–1.10)
Chloride: 106 mEq/L (ref 96–112)
Glucose, Bld: 80 mg/dL (ref 70–99)
Potassium: 4.3 mEq/L (ref 3.5–5.3)
Sodium: 140 mEq/L (ref 135–145)

## 2013-09-26 LAB — HEPATIC FUNCTION PANEL
ALBUMIN: 3.7 g/dL (ref 3.5–5.2)
ALT: 10 U/L (ref 0–35)
AST: 16 U/L (ref 0–37)
Alkaline Phosphatase: 87 U/L (ref 39–117)
BILIRUBIN TOTAL: 0.4 mg/dL (ref 0.2–1.2)
Bilirubin, Direct: 0.1 mg/dL (ref 0.0–0.3)
Indirect Bilirubin: 0.3 mg/dL (ref 0.2–1.2)
TOTAL PROTEIN: 6.8 g/dL (ref 6.0–8.3)

## 2013-09-26 LAB — HIV ANTIBODY (ROUTINE TESTING W REFLEX): HIV: NONREACTIVE

## 2013-09-26 LAB — CBC WITH DIFFERENTIAL/PLATELET
BASOS ABS: 0.1 10*3/uL (ref 0.0–0.1)
Basophils Relative: 1 % (ref 0–1)
EOS PCT: 3 % (ref 0–5)
Eosinophils Absolute: 0.2 10*3/uL (ref 0.0–0.7)
HEMATOCRIT: 32.1 % — AB (ref 36.0–46.0)
HEMOGLOBIN: 11.1 g/dL — AB (ref 12.0–15.0)
LYMPHS PCT: 29 % (ref 12–46)
Lymphs Abs: 1.8 10*3/uL (ref 0.7–4.0)
MCH: 30.3 pg (ref 26.0–34.0)
MCHC: 34.6 g/dL (ref 30.0–36.0)
MCV: 87.7 fL (ref 78.0–100.0)
MONO ABS: 0.4 10*3/uL (ref 0.1–1.0)
MONOS PCT: 6 % (ref 3–12)
NEUTROS ABS: 3.8 10*3/uL (ref 1.7–7.7)
Neutrophils Relative %: 61 % (ref 43–77)
Platelets: 320 10*3/uL (ref 150–400)
RBC: 3.66 MIL/uL — ABNORMAL LOW (ref 3.87–5.11)
RDW: 12.7 % (ref 11.5–15.5)
WBC: 6.3 10*3/uL (ref 4.0–10.5)

## 2013-09-26 LAB — LIPID PANEL
CHOL/HDL RATIO: 4.1 ratio
CHOLESTEROL: 181 mg/dL (ref 0–200)
HDL: 44 mg/dL (ref >39)
LDL Cholesterol: 114 mg/dL — ABNORMAL HIGH (ref 0–99)
Triglycerides: 114 mg/dL (ref <150)
VLDL: 23 mg/dL (ref 0–40)

## 2013-09-26 LAB — SEDIMENTATION RATE: SED RATE: 61 mm/h — AB (ref 0–22)

## 2013-09-26 LAB — TSH: TSH: 1.903 u[IU]/mL (ref 0.350–4.500)

## 2013-09-26 LAB — RHEUMATOID FACTOR

## 2013-09-26 NOTE — Progress Notes (Signed)
Subjective:    Patient ID: Joy [name], female    DOB: [birth year], 48 y.o.   MRN: [id number]  HPI  Joy [name] is a 48 yr old female who presents today for cpx.  Immunizations: tetanus up to date, declines flu shot Diet: Reports that she only eats 1 meal a day.  Exercise: active at work [employer] from Last 3 Encounters:  09/26/13 247 lb 1.3 oz (112.075 kg)  08/29/13 248 lb 3 oz (112.577 kg)  04/18/13 254 lb 12 oz (115.554 kg)  Pap Smear: s/p partial hysterectomy Mammogram: up to date   Review of Systems  Musculoskeletal:       Pain in fingers/knees.  Sometimes fingers swell, sometimes knee swelling       Past Medical History  Diagnosis Date  . Meningitis 2010    hospitalized    History   Social History  . Marital Status: Married    Spouse Name: Joy [name]    Number of Children: 2  . Years of Education: N/A   Occupational History  . ORDER PROCESSOR    Social History Main Topics  . Smoking status: Never Smoker   . Smokeless tobacco: Never Used  . Alcohol Use: Yes     Comment: sometimes  . Drug Use: No  . Sexual Activity: Not on file   Other Topics Concern  . Not on file   Social History Narrative   Regular exercise:  No          Past Surgical History  Procedure Laterality Date  . Abdominal hysterectomy  1994    partial  . Tonsillectomy    . Breast surgery  2003    BIOPSY LEFT BREAST--BENIGN    Family History  Problem Relation Age of Onset  . Stroke Mother   . Blindness Mother     in left eye due to stroke  . Glaucoma Mother     caused blindness of right eye  . Cancer Maternal Aunt 60    ovarian  . Cancer Maternal Grandmother 60    colon  . Diabetes Maternal Aunt   . Diabetes Maternal Aunt     No Known Allergies  No current outpatient prescriptions on file prior to visit.   No current facility-administered medications on file prior to visit.    BP 130/80  Pulse 76  Temp(Src) 97.9 F (36.6 C) (Oral)   Resp 16  Ht 5\' 5"  (1.651 m)  Wt 247 lb 1.3 oz (112.075 kg)  BMI 41.12 kg/m2  SpO2 99%    Objective:   Physical Exam  Physical Exam  Constitutional: She is oriented to person, place, and time. She appears well-developed and well-nourished. No distress.  HENT:  Head: Normocephalic and atraumatic.  Right Ear: Tympanic membrane and ear canal normal.  Left Ear: Tympanic membrane and ear canal normal.  Mouth/Throat: Oropharynx is clear and moist.  Eyes: Pupils are equal, round, and reactive to light. No scleral icterus.  Neck: Normal range of motion. No thyromegaly present.  Cardiovascular: Normal rate and regular rhythm.   No murmur heard. Pulmonary/Chest: Effort normal and breath sounds normal. No respiratory distress. He has no wheezes. She has no rales. She exhibits no tenderness.  Abdominal: Soft. Bowel sounds are normal. He exhibits no distension and no mass. There is no tenderness. There is no rebound and no guarding.  Musculoskeletal: She exhibits no edema.  Lymphadenopathy:    She has no cervical adenopathy.  Neurological: She is alert and  oriented to person, place, and time. She exhibits normal muscle tone. Coordination normal.  Skin: Skin is warm and dry.  Psychiatric: She has a normal mood and affect. Her behavior is normal. Judgment and thought content normal.  Breasts: Examined lying Right: Without masses, retractions, discharge or axillary adenopathy.  Left: Without masses, retractions, discharge or axillary adenopathy.  Inguinal/mons: Normal without inguinal adenopathy  External genitalia: Normal  BUS/Urethra/Skene's glands: Normal  Bladder: Normal  Vagina: Normal  Cervix: surgically absent Uterus: surgically absent Adnexa/parametria:  Rt: Without masses or tenderness.  Lt: Without masses or tenderness.  Anus and perineum: Normal           Assessment & Plan:         Assessment & Plan:

## 2013-09-26 NOTE — Patient Instructions (Addendum)
Please complete lab work prior to leaving. Schedule follow up mammogram in June.  Work hard on [employer], exercise, weight loss. You can keep track of calories and exercise using Myfitnesspal app on your phone or computer. Follow up in 1 year, sooner if problems or concerns.

## 2013-09-26 NOTE — Assessment & Plan Note (Signed)
Family hx of RA.  Will check rheumatoid factor, ANA, ESR.

## 2013-09-26 NOTE — Assessment & Plan Note (Signed)
Discussed healthy diet, exercise, weight loss.  

## 2013-09-27 LAB — URINALYSIS, ROUTINE W REFLEX MICROSCOPIC
BILIRUBIN URINE: NEGATIVE
GLUCOSE, UA: NEGATIVE mg/dL
KETONES UR: NEGATIVE mg/dL
Leukocytes, UA: NEGATIVE
Nitrite: NEGATIVE
Protein, ur: NEGATIVE mg/dL
SPECIFIC GRAVITY, URINE: 1.021 (ref 1.005–1.030)
UROBILINOGEN UA: 1 mg/dL (ref 0.0–1.0)
pH: 7 (ref 5.0–8.0)

## 2013-09-27 LAB — URINALYSIS, MICROSCOPIC ONLY
Bacteria, UA: NONE SEEN
CRYSTALS: NONE SEEN
Casts: NONE SEEN
SQUAMOUS EPITHELIAL / LPF: NONE SEEN

## 2013-09-29 ENCOUNTER — Telehealth: Payer: Self-pay | Admitting: Family

## 2013-09-29 DIAGNOSIS — M2550 Pain in unspecified joint: Secondary | ICD-10-CM

## 2013-09-29 LAB — ANA: ANA: NEGATIVE

## 2013-09-29 NOTE — Telephone Encounter (Signed)
See mychart.  

## 2013-10-02 ENCOUNTER — Encounter: Payer: Self-pay | Admitting: Family

## 2013-10-02 ENCOUNTER — Other Ambulatory Visit: Payer: Self-pay | Admitting: Family

## 2013-10-02 LAB — VITAMIN B12: VITAMIN B 12: 230 pg/mL (ref 211–911)

## 2013-10-02 LAB — FERRITIN: FERRITIN: 388 ng/mL — AB (ref 10–291)

## 2013-10-02 LAB — IRON AND TIBC
%SAT: 33 % (ref 20–55)
Iron: 83 ug/dL (ref 42–145)
TIBC: 252 ug/dL (ref 250–470)
UIBC: 169 ug/dL (ref 125–400)

## 2013-10-02 LAB — FOLATE: FOLATE: 6.9 ng/mL

## 2013-10-02 MED ORDER — VITAMIN B-12 100 MCG PO TABS: 100.0000 ug | ORAL_TABLET | Freq: Every day | ORAL | Status: DC

## 2013-10-06 ENCOUNTER — Telehealth: Payer: Self-pay | Admitting: Family

## 2013-10-12 ENCOUNTER — Emergency Department (HOSPITAL_BASED_OUTPATIENT_CLINIC_OR_DEPARTMENT_OTHER): Payer: 59

## 2013-10-12 ENCOUNTER — Encounter (HOSPITAL_BASED_OUTPATIENT_CLINIC_OR_DEPARTMENT_OTHER): Payer: Self-pay | Admitting: Emergency Medicine

## 2013-10-12 ENCOUNTER — Emergency Department (HOSPITAL_BASED_OUTPATIENT_CLINIC_OR_DEPARTMENT_OTHER)
Admission: EM | Admit: 2013-10-12 | Discharge: 2013-10-12 | Disposition: A | Discharge status: Home / Self Care | Payer: 59 | Attending: Emergency Medicine | Admitting: Emergency Medicine

## 2013-10-12 DIAGNOSIS — Z8661 Personal history of infections of the central nervous system: Secondary | ICD-10-CM | POA: Insufficient documentation

## 2013-10-12 DIAGNOSIS — J189 Pneumonia, unspecified organism: Secondary | ICD-10-CM

## 2013-10-12 DIAGNOSIS — H9209 Otalgia, unspecified ear: Secondary | ICD-10-CM | POA: Insufficient documentation

## 2013-10-12 DIAGNOSIS — R000 Tachycardia, unspecified: Secondary | ICD-10-CM | POA: Insufficient documentation

## 2013-10-12 DIAGNOSIS — J181 Lobar pneumonia, unspecified organism: Secondary | ICD-10-CM

## 2013-10-12 MED ORDER — GUAIFENESIN-CODEINE 100-10 MG/5ML PO SYRP: 5.0000 mL | ORAL_SOLUTION | Freq: Three times a day (TID) | ORAL | Status: DC | PRN

## 2013-10-12 MED ORDER — ALBUTEROL SULFATE HFA 108 (90 BASE) MCG/ACT IN AERS
2.0000 | INHALATION_SPRAY | RESPIRATORY_TRACT | Status: DC | PRN
  Administered 2013-10-12: 2 via RESPIRATORY_TRACT
  Filled 2013-10-12: qty 6.7

## 2013-10-12 MED ORDER — LEVOFLOXACIN IN D5W 750 MG/150ML IV SOLN: 750.0000 mg | Freq: Once | INTRAVENOUS | Status: DC

## 2013-10-12 MED ORDER — LEVOFLOXACIN 750 MG PO TABS
ORAL_TABLET | ORAL | Status: AC
  Filled 2013-10-12: qty 1

## 2013-10-12 MED ORDER — IPRATROPIUM-ALBUTEROL 0.5-2.5 (3) MG/3ML IN SOLN
3.0000 mL | RESPIRATORY_TRACT | Status: DC
Start: 2013-10-12 — End: 2013-10-12

## 2013-10-12 MED ORDER — LEVOFLOXACIN 750 MG PO TABS
750.0000 mg | ORAL_TABLET | Freq: Once | ORAL | Status: AC
  Administered 2013-10-12: 750 mg via ORAL

## 2013-10-12 MED ORDER — LEVOFLOXACIN 750 MG PO TABS: 750.0000 mg | ORAL_TABLET | Freq: Every day | ORAL | Status: DC

## 2013-10-12 NOTE — Discharge Instructions (Signed)
Take the medications as directed. Do not take the cough medication if you are driving as it will make you sleepy. Follow up with your doctor. If your symptoms worsen, return here.

## 2013-10-12 NOTE — ED Notes (Signed)
Patient has strong congested cough, aches, chills, SOB

## 2013-10-12 NOTE — ED Provider Notes (Signed)
CSN: [id number]     Arrival date & time 10/12/13  1514 History   First MD Initiated Contact with Patient 10/12/13 1642     Chief Complaint  Patient presents with  . Cough     (Consider location/radiation/quality/duration/timing/severity/associated sxs/prior Treatment) Patient is a 48 y.o. female presenting with cough. The history is provided by the patient.  Cough Cough characteristics:  Productive Sputum characteristics:  Yellow Onset quality:  Gradual Duration:  5 days Timing:  Constant Progression:  Worsening Chronicity:  New Smoker: no   Relieved by:  Nothing Worsened by:  Lying down and activity Ineffective treatments:  Decongestant and cough suppressants Associated symptoms: chills, ear pain, myalgias, shortness of breath, sore throat and wheezing   Associated symptoms: no eye discharge, no headaches and no rash    LACREASHA HINDS is a 48 y.o. female who presents to the ED with cough and congestion, chills, wheezing and just feeling bad for the past 5 days. She has been taking OTC medications without relief. She denies nausea, vomiting or diarrhea.   Past Medical History  Diagnosis Date  . Meningitis 2010    hospitalized   Past Surgical History  Procedure Laterality Date  . Abdominal hysterectomy  1994    partial  . Tonsillectomy    . Breast surgery  2003    BIOPSY LEFT BREAST--BENIGN   Family History  Problem Relation Age of Onset  . Stroke Mother   . Blindness Mother     in left eye due to stroke  . Glaucoma Mother     caused blindness of right eye  . Cancer Maternal Aunt 60    ovarian  . Cancer Maternal Grandmother 60    colon  . Diabetes Maternal Aunt   . Diabetes Maternal Aunt    History  Substance Use Topics  . Smoking status: Never Smoker   . Smokeless tobacco: Never Used  . Alcohol Use: Yes     Comment: sometimes   OB History   Grav Para Term Preterm Abortions TAB SAB Ect Mult Living                 Review of Systems  Constitutional:  Positive for chills.  HENT: Positive for ear pain and sore throat.   Eyes: Negative for discharge, redness and visual disturbance.  Respiratory: Positive for cough, shortness of breath and wheezing.   Gastrointestinal: Negative for nausea, vomiting, abdominal pain, diarrhea and constipation.  Genitourinary: Negative for dysuria, urgency, frequency, vaginal bleeding and vaginal discharge.  Musculoskeletal: Positive for myalgias.  Skin: Negative for rash.  Neurological: Negative for light-headedness and headaches.  Psychiatric/Behavioral: Negative for confusion. The patient is not nervous/anxious.       Allergies  Review of patient's allergies indicates no known allergies.  Home Medications   Current Outpatient Rx  Name  Route  Sig  Dispense  Refill  . vitamin B-12 (CYANOCOBALAMIN) 100 MCG tablet   Oral   Take 1 tablet (100 mcg total) by mouth daily.          BP 108/70  Pulse 106  Temp(Src) 98.8 F (37.1 C) (Oral)  Resp 18  SpO2 96% Physical Exam  Nursing note and vitals reviewed. Constitutional: She is oriented to person, place, and time. She appears well-developed and well-nourished. No distress.  HENT:  Head: Normocephalic and atraumatic.  Right Ear: Tympanic membrane normal.  Left Ear: Tympanic membrane normal.  Nose: Mucosal edema and rhinorrhea present.  Mouth/Throat: Uvula is midline, oropharynx is clear and  moist and mucous membranes are normal.  Eyes: EOM are normal.  Neck: Neck supple.  Cardiovascular: Tachycardia present.   Pulmonary/Chest: Effort normal. She has decreased breath sounds. She has wheezes in the right lower field and the left lower field. She has rales in the right middle field.  Abdominal: Soft. Bowel sounds are normal. There is no tenderness.  Musculoskeletal: Normal range of motion.  Neurological: She is alert and oriented to person, place, and time. No cranial nerve deficit.  Skin: Skin is warm and dry.  Psychiatric: She has a normal mood  and affect. Her behavior is normal.    ED Course: I discussed this case with Dr. Darl Householder  Procedures (including critical care time) Labs Review Labs Reviewed - No data to display Imaging Review Dg Chest 2 View  10/12/2013   CLINICAL DATA:  Cough, congestion  EXAM: CHEST  2 VIEW  COMPARISON:  None.  FINDINGS: Cardiomediastinal silhouette is unremarkable. There is right middle lobe streaky atelectasis or infiltrate. No pulmonary edema.  IMPRESSION: Right middle lobe streaky atelectasis or infiltrate. No pulmonary edema.   Electronically Signed   By: Lahoma Crocker M.D.   On: 10/12/2013 16:11    MDM  48 y.o. female with cough, congestion, chills, shortness of breath and body aches x one week. Stable for discharge with no difficulty breathing. Will treat pneumonia with Levaquin and give her cough medication. She will follow up with her PCP or return here for worsening symptoms. BP 108/70  Pulse 106  Temp(Src) 98.8 F (37.1 C) (Oral)  Resp 18  SpO2 98%    Medication List    TAKE these medications       guaiFENesin-codeine 100-10 MG/5ML syrup  Commonly known as:  GUAIFENESIN AC  Take 5 mLs by mouth 3 (three) times daily as needed for cough.     levofloxacin 750 MG tablet  Commonly known as:  LEVAQUIN  Take 1 tablet (750 mg total) by mouth daily.      ASK your doctor about these medications       vitamin B-12 100 MCG tablet  Commonly known as:  CYANOCOBALAMIN  Take 1 tablet (100 mcg total) by mouth daily.           [name] [place], [place] 10/12/13 2352

## 2013-10-13 ENCOUNTER — Telehealth: Payer: Self-pay | Admitting: Family

## 2013-10-13 NOTE — Telephone Encounter (Signed)
No answer, no voicemail.  Will try again later.

## 2013-10-13 NOTE — Telephone Encounter (Signed)
Please contact pt and arrange a 1 week ED follow up. 

## 2013-10-14 ENCOUNTER — Telehealth: Payer: Self-pay | Admitting: Family

## 2013-10-14 NOTE — Telephone Encounter (Signed)
Needs work not to be extended until she is seen on Friday, er put her out till tomorrow. She does have appt with Elyn Aquas on Friday.

## 2013-10-14 NOTE — Telephone Encounter (Signed)
Appointment scheduled for 10/17/13 °

## 2013-10-14 NOTE — Telephone Encounter (Signed)
Spoke with pt.  She states she will pick up work note at appt on Friday.  I reminded pt that Einar Pheasant will be at the [facility] office and she can get letter at that visit and pt voices understanding.

## 2013-10-14 NOTE — Telephone Encounter (Signed)
OK to extend work note until Friday.

## 2013-10-15 NOTE — ED Provider Notes (Signed)
Medical screening examination/treatment/procedure(s) were performed by non-physician practitioner and as supervising physician I was immediately available for consultation/collaboration.   EKG Interpretation None        Wandra Arthurs, MD 10/15/13 [phone]

## 2013-10-17 ENCOUNTER — Telehealth: Payer: Self-pay | Admitting: Family

## 2013-10-17 ENCOUNTER — Encounter: Payer: Self-pay | Admitting: Physician Assistant

## 2013-10-17 ENCOUNTER — Ambulatory Visit (INDEPENDENT_AMBULATORY_CARE_PROVIDER_SITE_OTHER): Payer: 59 | Admitting: Physician Assistant

## 2013-10-17 VITALS — BP 126/76 | HR 77 | Temp 98.3°F | Wt 249.0 lb

## 2013-10-17 DIAGNOSIS — E538 Deficiency of other specified B group vitamins: Secondary | ICD-10-CM

## 2013-10-17 DIAGNOSIS — J189 Pneumonia, unspecified organism: Secondary | ICD-10-CM

## 2013-10-17 MED ORDER — VITAMIN B-12 100 MCG PO TABS: 100.0000 ug | ORAL_TABLET | Freq: Every day | ORAL | Status: DC

## 2013-10-17 MED ORDER — BENZONATATE 200 MG PO CAPS: 200.0000 mg | ORAL_CAPSULE | Freq: Two times a day (BID) | ORAL | Status: DC | PRN

## 2013-10-17 NOTE — Telephone Encounter (Signed)
Wants to verify some information on her disability claim

## 2013-10-17 NOTE — Progress Notes (Signed)
Patient presents to clinic today for ED follow-up.  Patient was seen on 10/13/13 and diagnosed with CAP.  Patient was given RX for Guaifenesin-AC and 20-day course of Levaquin.  Since ER discharge, patient states her symptoms have been improving.  Still having a cough but is becoming less productive.  Denies shortness of breath except with exertion.  Denies pleuritic chest pain, fever, chills or aches.  Patient still feeling tired but states it is getting better daily.  Has taken her Levaquin as directed.  Is taking cough syrup but states she can only take it at night because it is making her excessively sleepy.  Needs a note to excuse her from work due to her illness.  Past Medical History  Diagnosis Date  . Meningitis 2010    hospitalized    Current Outpatient Prescriptions on File Prior to Visit  Medication Sig Dispense Refill  . guaiFENesin-codeine (GUAIFENESIN AC) 100-10 MG/5ML syrup Take 5 mLs by mouth 3 (three) times daily as needed for cough.  120 mL  0  . levofloxacin (LEVAQUIN) 750 MG tablet Take 1 tablet (750 mg total) by mouth daily.  20 tablet  0   No current facility-administered medications on file prior to visit.    No Known Allergies  Family History  Problem Relation Age of Onset  . Stroke Mother   . Blindness Mother     in left eye due to stroke  . Glaucoma Mother     caused blindness of right eye  . Cancer Maternal Aunt 60    ovarian  . Cancer Maternal Grandmother 60    colon  . Diabetes Maternal Aunt   . Diabetes Maternal Aunt     History   Social History  . Marital Status: Married    Spouse Name: [name]    Number of Children: 2  . Years of Education: N/A   Occupational History  . ORDER PROCESSOR    Social History Main Topics  . Smoking status: Never Smoker   . Smokeless tobacco: Never Used  . Alcohol Use: Yes     Comment: sometimes  . Drug Use: No  . Sexual Activity: Not on file   Other Topics Concern  . Not on file   Social History  Narrative   Regular exercise:  No          Review of Systems - See HPI.  All other ROS are negative.  BP 126/76  Pulse 77  Temp(Src) 98.3 F (36.8 C) (Oral)  Wt 249 lb (112.946 kg)  SpO2 99%  Physical Exam  Vitals reviewed. Constitutional: She is oriented to person, place, and time and well-developed, well-nourished, and in no distress.  HENT:  Head: Normocephalic and atraumatic.  Right Ear: Tympanic membrane, external ear and ear canal normal.  Left Ear: Tympanic membrane, external ear and ear canal normal.  Nose: Nose normal.  Mouth/Throat: Uvula is midline, oropharynx is clear and moist and mucous membranes are normal. No oropharyngeal exudate, posterior oropharyngeal edema, posterior oropharyngeal erythema or tonsillar abscesses.  Eyes: Conjunctivae are normal. Pupils are equal, round, and reactive to light.  Neck: Neck supple.  Cardiovascular: Normal rate, regular rhythm, normal heart sounds and intact distal pulses.   Pulmonary/Chest: Effort normal and breath sounds normal. No accessory muscle usage. Not tachypneic. No respiratory distress. She has no decreased breath sounds. She has no wheezes. She has no rales. She exhibits no tenderness.  Faint crackling present in RML/RLL, that clears with coughing.  Lymphadenopathy:  She has no cervical adenopathy.  Neurological: She is alert and oriented to person, place, and time.  Skin: Skin is warm and dry. No rash noted.  Psychiatric: Affect normal.    Recent Results (from the past 2160 hour(s))  BASIC METABOLIC PANEL WITH GFR     Status: None   Collection Time    09/26/13 12:10 PM      Result Value Ref Range   Sodium 140  135 - 145 mEq/L   Potassium 4.3  3.5 - 5.3 mEq/L   Chloride 106  96 - 112 mEq/L   CO2 28  19 - 32 mEq/L   Glucose, Bld 80  70 - 99 mg/dL   BUN 12  6 - 23 mg/dL   Creat 0.76  0.50 - 1.10 mg/dL   Calcium 9.0  8.4 - 10.5 mg/dL   GFR, Est African American >89     GFR, Est Non African American >89      Comment:       The estimated GFR is a calculation valid for adults (>=23 years old)     that uses the CKD-EPI algorithm to adjust for age and sex. It is       not to be used for children, pregnant women, hospitalized patients,        patients on dialysis, or with rapidly changing kidney function.     According to the NKDEP, eGFR >89 is normal, 60-89 shows mild     impairment, 30-59 shows moderate impairment, 15-29 shows severe     impairment and <15 is ESRD.        HEPATIC FUNCTION PANEL     Status: None   Collection Time    09/26/13 12:10 PM      Result Value Ref Range   Total Bilirubin 0.4  0.2 - 1.2 mg/dL   Comment: ** Please note change in reference range(s). **   Bilirubin, Direct 0.1  0.0 - 0.3 mg/dL   Indirect Bilirubin 0.3  0.2 - 1.2 mg/dL   Comment: ** Please note change in reference range(s). **   Alkaline Phosphatase 87  39 - 117 U/L   AST 16  0 - 37 U/L   ALT 10  0 - 35 U/L   Total Protein 6.8  6.0 - 8.3 g/dL   Albumin 3.7  3.5 - 5.2 g/dL  LIPID PANEL     Status: Abnormal   Collection Time    09/26/13 12:10 PM      Result Value Ref Range   Cholesterol 181  0 - 200 mg/dL   Comment: ATP III Classification:           < 200        mg/dL        Desirable          200 - 239     mg/dL        Borderline High          >= 240        mg/dL        High         Triglycerides 114  <150 mg/dL   HDL 44  >39 mg/dL   Total CHOL/HDL Ratio 4.1     VLDL 23  0 - 40 mg/dL   LDL Cholesterol 114 (*) 0 - 99 mg/dL   Comment:       Total Cholesterol/HDL Ratio:CHD Risk  Coronary Heart Disease Risk Table                                            Men       Women              1/2 Average Risk              3.4        3.3                  Average Risk              5.0        4.4               2X Average Risk              9.6        7.1               3X Average Risk             23.4       11.0     Use the calculated Patient Ratio above and the CHD Risk table      to  determine the patient's CHD Risk.     ATP III Classification (LDL):           < 100        mg/dL         Optimal          100 - 129     mg/dL         Near or Above Optimal          130 - 159     mg/dL         Borderline High          160 - 189     mg/dL         High           > 190        mg/dL         Very High        TSH     Status: None   Collection Time    09/26/13 12:10 PM      Result Value Ref Range   TSH 1.903  0.350 - 4.500 uIU/mL  CBC WITH DIFFERENTIAL     Status: Abnormal   Collection Time    09/26/13 12:10 PM      Result Value Ref Range   WBC 6.3  4.0 - 10.5 K/uL   RBC 3.66 (*) 3.87 - 5.11 MIL/uL   Hemoglobin 11.1 (*) 12.0 - 15.0 g/dL   HCT 32.1 (*) 36.0 - 46.0 %   MCV 87.7  78.0 - 100.0 fL   MCH 30.3  26.0 - 34.0 pg   MCHC 34.6  30.0 - 36.0 g/dL   RDW 12.7  11.5 - 15.5 %   Platelets 320  150 - 400 K/uL   Neutrophils Relative % 61  43 - 77 %   Neutro Abs 3.8  1.7 - 7.7 K/uL   Lymphocytes Relative 29  12 - 46 %   Lymphs Abs 1.8  0.7 - 4.0 K/uL   Monocytes Relative 6  3 - 12 %   Monocytes Absolute 0.4  0.1 - 1.0 K/uL  Eosinophils Relative 3  0 - 5 %   Eosinophils Absolute 0.2  0.0 - 0.7 K/uL   Basophils Relative 1  0 - 1 %   Basophils Absolute 0.1  0.0 - 0.1 K/uL   Smear Review Criteria for review not met    URINALYSIS, ROUTINE W REFLEX MICROSCOPIC     Status: Abnormal   Collection Time    09/26/13 12:10 PM      Result Value Ref Range   Color, Urine YELLOW  YELLOW   APPearance CLEAR  CLEAR   Specific Gravity, Urine 1.021  1.005 - 1.030   pH 7.0  5.0 - 8.0   Glucose, UA NEG  NEG mg/dL   Bilirubin Urine NEG  NEG   Ketones, ur NEG  NEG mg/dL   Hgb urine dipstick MOD (*) NEG   Protein, ur NEG  NEG mg/dL   Urobilinogen, UA 1  0.0 - 1.0 mg/dL   Nitrite NEG  NEG   Leukocytes, UA NEG  NEG  HIV ANTIBODY (ROUTINE TESTING)     Status: None   Collection Time    09/26/13 12:10 PM      Result Value Ref Range   HIV NON REACTIVE  NON REACTIVE  RHEUMATOID FACTOR      Status: None   Collection Time    09/26/13 12:10 PM      Result Value Ref Range   Rheumatoid Factor <10  <=14 IU/mL   Comment:                                Interpretive Table                         Low Positive: 15 - 41 IU/mL                         High Positive:  >= 42 IU/mL            In addition to the RF result, and clinical symptoms including joint      involvement, the 2010 ACR Classification Criteria for      scoring/diagnosing Rheumatoid Arthritis include the results of the      following tests:  CRP (12458), ESR (15010), and CCP (APCA) (09983).      www.rheumatology.org/practice/clinical/classification/ra/ra_2010.asp  ANA     Status: None   Collection Time    09/26/13 12:10 PM      Result Value Ref Range   ANA NEG  NEGATIVE  SEDIMENTATION RATE     Status: Abnormal   Collection Time    09/26/13 12:10 PM      Result Value Ref Range   Sed Rate 61 (*) 0 - 22 mm/hr  URINALYSIS, MICROSCOPIC ONLY     Status: Abnormal   Collection Time    09/26/13 12:10 PM      Result Value Ref Range   Squamous Epithelial / LPF NONE SEEN  RARE   Crystals NONE SEEN  NONE SEEN   Casts NONE SEEN  NONE SEEN   WBC, UA 0-2  <3 WBC/hpf   RBC / HPF 3-6 (*) <3 RBC/hpf   Bacteria, UA NONE SEEN  RARE  IRON AND TIBC     Status: None   Collection Time    09/26/13 12:10 PM      Result Value Ref Range   Iron 83  42 -  145 ug/dL   UIBC 169  125 - 400 ug/dL   TIBC 252  250 - 470 ug/dL   %SAT 33  20 - 55 %  VITAMIN B12     Status: None   Collection Time    09/26/13 12:10 PM      Result Value Ref Range   Vitamin B-12 230  211 - 911 pg/mL  FOLATE     Status: None   Collection Time    09/26/13 12:10 PM      Result Value Ref Range   Folate 6.9     Comment:       Reference Ranges             Deficient:       0.4 - 3.3 ng/mL             Indeterminate:   3.4 - 5.4 ng/mL             Normal:              > 5.4 ng/mL        FERRITIN     Status: Abnormal   Collection Time    09/26/13 12:10 PM       Result Value Ref Range   Ferritin 388 (*) 10 - 291 ng/mL    Assessment/Plan: CAP (community acquired pneumonia) Vitals signs look good. Improving.  Continue Levaquin, although I feel 14-day course is sufficient.  Continue Guaifenesin AC at bedtime.  Rx Tessalon Perles to help with daytime cough.  Increase fluids.  Rest.  Saline nasal spray.  Mucinex. Humidifier in bedroom.  Note given for work.  Follow-up in 1 week to ensure resolution of symptoms.

## 2013-10-17 NOTE — Assessment & Plan Note (Signed)
Vitals signs look good. Improving.  Continue Levaquin, although I feel 14-day course is sufficient.  Continue Guaifenesin AC at bedtime.  Rx Tessalon Perles to help with daytime cough.  Increase fluids.  Rest.  Saline nasal spray.  Mucinex. Humidifier in bedroom.  Note given for work.  Follow-up in 1 week to ensure resolution of symptoms.

## 2013-10-17 NOTE — Patient Instructions (Signed)
Please increase fluid intake.  Rest.  Use saline nasal spray.  Continue Levaquin.  I do feel that a 14 day course is sufficient.  Use inhaler as needed.  Take Tessalon Perles for cough during they.  Use prescription cough syrup at bedtime.  Place a humidifier in the bedroom.  Use plain Mucinex as needed for chest congestion.  Follow-up with Melissa in 1 week.  Pneumonia, Adult Pneumonia is an infection of the lungs.  CAUSES Pneumonia may be caused by bacteria or a virus. Usually, these infections are caused by breathing infectious particles into the lungs (respiratory tract). SYMPTOMS   Cough.  Fever.  Chest pain.  Increased rate of breathing.  Wheezing.  Mucus production. DIAGNOSIS  If you have the common symptoms of pneumonia, your caregiver will typically confirm the diagnosis with a chest X-ray. The X-ray will show an abnormality in the lung (pulmonary infiltrate) if you have pneumonia. Other tests of your blood, urine, or sputum may be done to find the specific cause of your pneumonia. Your caregiver may also do tests (blood gases or pulse oximetry) to see how well your lungs are working. TREATMENT  Some forms of pneumonia may be spread to other people when you cough or sneeze. You may be asked to wear a mask before and during your exam. Pneumonia that is caused by bacteria is treated with antibiotic medicine. Pneumonia that is caused by the influenza virus may be treated with an antiviral medicine. Most other viral infections must run their course. These infections will not respond to antibiotics.  PREVENTION A pneumococcal shot (vaccine) is available to prevent a common bacterial cause of pneumonia. This is usually suggested for:  People over 55 years old.  Patients on chemotherapy.  People with chronic lung problems, such as bronchitis or emphysema.  People with immune system problems. If you are over 65 or have a high risk condition, you may receive the pneumococcal vaccine  if you have not received it before. In some countries, a routine influenza vaccine is also recommended. This vaccine can help prevent some cases of pneumonia.You may be offered the influenza vaccine as part of your care. If you smoke, it is time to quit. You may receive instructions on how to stop smoking. Your caregiver can provide medicines and counseling to help you quit. HOME CARE INSTRUCTIONS   Cough suppressants may be used if you are losing too much rest. However, coughing protects you by clearing your lungs. You should avoid using cough suppressants if you can.  Your caregiver may have prescribed medicine if he or she thinks your pneumonia is caused by a bacteria or influenza. Finish your medicine even if you start to feel better.  Your caregiver may also prescribe an expectorant. This loosens the mucus to be coughed up.  Only take over-the-counter or prescription medicines for pain, discomfort, or fever as directed by your caregiver.  Do not smoke. Smoking is a common cause of bronchitis and can contribute to pneumonia. If you are a smoker and continue to smoke, your cough may last several weeks after your pneumonia has cleared.  A cold steam vaporizer or humidifier in your room or home may help loosen mucus.  Coughing is often worse at night. Sleeping in a semi-upright position in a recliner or using a couple pillows under your head will help with this.  Get rest as you feel it is needed. Your body will usually let you know when you need to rest. [place]  IF:   Your illness becomes worse. This is especially true if you are elderly or weakened from any other disease.  You cannot control your cough with suppressants and are losing sleep.  You begin coughing up blood.  You develop pain which is getting worse or is uncontrolled with medicines.  You have a fever.  Any of the symptoms which initially brought you in for treatment are getting worse rather than  better.  You develop shortness of breath or chest pain. MAKE SURE YOU:   Understand these instructions.  Will watch your condition.  Will get help right away if you are not doing well or get worse. Document Released: 07/31/2005 Document Revised: 10/23/2011 Document Reviewed: 10/20/2010 [facility] Patient Information 2014 [place], [place].

## 2013-10-21 NOTE — Telephone Encounter (Signed)
This is an [place] pt

## 2013-10-21 NOTE — Telephone Encounter (Signed)
Left a message for Joy [name] to return my call.

## 2013-10-24 ENCOUNTER — Telehealth: Payer: Self-pay | Admitting: Family

## 2013-10-24 ENCOUNTER — Encounter: Payer: Self-pay | Admitting: Family

## 2013-10-24 ENCOUNTER — Ambulatory Visit (HOSPITAL_BASED_OUTPATIENT_CLINIC_OR_DEPARTMENT_OTHER)
Admission: RE | Admit: 2013-10-24 | Discharge: 2013-10-24 | Disposition: A | Discharge status: Home / Self Care | Payer: 59 | Source: Ambulatory Visit | Attending: Family | Admitting: Family

## 2013-10-24 ENCOUNTER — Ambulatory Visit (INDEPENDENT_AMBULATORY_CARE_PROVIDER_SITE_OTHER): Payer: 59 | Admitting: Family

## 2013-10-24 VITALS — BP 140/90 | HR 72 | Temp 97.8°F | Resp 16 | Ht 65.0 in | Wt 253.0 lb

## 2013-10-24 DIAGNOSIS — J189 Pneumonia, unspecified organism: Secondary | ICD-10-CM | POA: Insufficient documentation

## 2013-10-24 NOTE — Patient Instructions (Signed)
Please complete your chest x ray on the first floor.  Call if cough does not continue to improve.

## 2013-10-24 NOTE — Assessment & Plan Note (Signed)
Clinically improving.  CXR resolved.  She continues to cough and feel some sob with exertion since her pneumonia.  Will give her a few more days to recuperate. Recommend that she return to work on Wednesday of next weel.

## 2013-10-24 NOTE — Telephone Encounter (Signed)
Attempted to reach pt, unable to leave message. Letter has been placed at the front desk for pick up.

## 2013-10-24 NOTE — Telephone Encounter (Signed)
Spoke with Maudie Mercury and gave onset of illness and office visit dates and expected return to work date per 10/24/13 phone note.

## 2013-10-24 NOTE — Telephone Encounter (Signed)
[employer] calling again, must receive call today [phone] ext [phone]) or her case will be closed today

## 2013-10-24 NOTE — Telephone Encounter (Signed)
Please let pt know that her chest x ray shows resolution of pneumonia. I recommend that she return to work on Wednesday.  See letter.

## 2013-10-24 NOTE — Progress Notes (Signed)
Pre visit review using our clinic review tool, if applicable. No additional management support is needed unless otherwise documented below in the visit note. 

## 2013-10-24 NOTE — Progress Notes (Signed)
Subjective:    Patient ID: Joy [name], female    DOB: [birth year], 48 y.o.   MRN: [id number]  HPI  Joy [name] is a 48 yr old female who presents today for follow up of pneumonia. Initially seen on 10/13/13 in the ED and diagnosed with CAP. Was treated with levaquin. Saw Elyn Aquas PA on 3/6 in follow up.  Symptoms at that time seemed to be improving.   She is using tessalon during the day.  Tessalon helps but still has some coughing. Using codeine cough syrup at night. Reports Wednesday she needed to use her inhaler.  Feels some shortness of breath with walking.  She is on day 14/14 of antibiotics.    She is requesting that we fill short term disability form to cover her absence. Her employer has told her "not to come back until you are 100%." She has a very active job. Reports that she walks 7 hours a day with an RF scanner, climbs stairs "constantly" at her job.  Review of Systems See HPI  Past Medical History  Diagnosis Date  . Meningitis 2010    hospitalized    History   Social History  . Marital Status: Married    Spouse Name: [name]    Number of Children: 2  . Years of Education: N/A   Occupational History  . ORDER PROCESSOR    Social History Main Topics  . Smoking status: Never Smoker   . Smokeless tobacco: Never Used  . Alcohol Use: Yes     Comment: sometimes  . Drug Use: No  . Sexual Activity: Not on file   Other Topics Concern  . Not on file   Social History Narrative   Regular exercise:  No          Past Surgical History  Procedure Laterality Date  . Abdominal hysterectomy  1994    partial  . Tonsillectomy    . Breast surgery  2003    BIOPSY LEFT BREAST--BENIGN    Family History  Problem Relation Age of Onset  . Stroke Mother   . Blindness Mother     in left eye due to stroke  . Glaucoma Mother     caused blindness of right eye  . Cancer Maternal Aunt 60    ovarian  . Cancer Maternal Grandmother 60    colon  . Diabetes  Maternal Aunt   . Diabetes Maternal Aunt     No Known Allergies  Current Outpatient Prescriptions on File Prior to Visit  Medication Sig Dispense Refill  . benzonatate (TESSALON) 200 MG capsule Take 1 capsule (200 mg total) by mouth 2 (two) times daily as needed for cough.  20 capsule  0  . guaiFENesin-codeine (GUAIFENESIN AC) 100-10 MG/5ML syrup Take 5 mLs by mouth 3 (three) times daily as needed for cough.  120 mL  0  . levofloxacin (LEVAQUIN) 750 MG tablet Take 1 tablet (750 mg total) by mouth daily.  20 tablet  0  . vitamin B-12 (CYANOCOBALAMIN) 100 MCG tablet Take 1 tablet (100 mcg total) by mouth daily.  30 tablet  3   No current facility-administered medications on file prior to visit.    BP 140/90  Pulse 72  Temp(Src) 97.8 F (36.6 C) (Oral)  Resp 16  Ht 5\' 5"  (1.651 m)  Wt 253 lb (114.76 kg)  BMI 42.10 kg/m2  SpO2 98%       Objective:   Physical Exam  Constitutional: She is  oriented to person, place, and time. She appears well-developed and well-nourished. No distress.  HENT:  Head: Normocephalic and atraumatic.  Right Ear: Tympanic membrane and ear canal normal.  Left Ear: Tympanic membrane and ear canal normal.  Mouth/Throat: No oropharyngeal exudate, posterior oropharyngeal edema or posterior oropharyngeal erythema.  Cardiovascular: Normal rate and regular rhythm.   No murmur heard. Pulmonary/Chest: Effort normal and breath sounds normal. No respiratory distress. She has no wheezes. She has no rales. She exhibits no tenderness.  Musculoskeletal: She exhibits no edema.  Neurological: She is alert and oriented to person, place, and time.  Psychiatric: She has a normal mood and affect. Her behavior is normal. Judgment and thought content normal.          Assessment & Plan:

## 2013-10-24 NOTE — Telephone Encounter (Signed)
Notified pt and she voices understanding. 

## 2013-10-29 ENCOUNTER — Telehealth: Payer: Self-pay | Admitting: *Deleted

## 2013-10-29 NOTE — Telephone Encounter (Signed)
FMLA and short term disability forms completed. Both forms and most recent office visit faxed to [employer] at [phone].  Forms forwarded to front office to attach billing sheet to FMLA. Notified pt that forms were completed and faxed, she does not want to get a copy. Advised pt that there would be a charge for completing FMLA form that she may be billed for and she voices understanding.

## 2013-11-26 NOTE — Telephone Encounter (Signed)
Opened in error

## 2013-12-02 ENCOUNTER — Telehealth: Payer: Self-pay | Admitting: Family

## 2013-12-02 NOTE — Telephone Encounter (Signed)
Message copied by Debbrah Alar on Tue Dec 02, 2013 10:17 AM ------      Message from: Irven [place]      Created: Tue Dec 02, 2013  9:18 AM       Dr. Kekoa Fyock Noon office called saying that patient "no showed" for her appointment this morning ------

## 2014-01-12 ENCOUNTER — Telehealth: Payer: Self-pay | Admitting: Family

## 2014-01-12 DIAGNOSIS — M2550 Pain in unspecified joint: Secondary | ICD-10-CM

## 2014-01-12 NOTE — Telephone Encounter (Signed)
Patient states that Melissa had referred her to Dr. Amil Amen. She says that she had to cancel the appointment with him and now needs to reschedule but that office will not reschedule with her. She would like to be referred to another Rheumatologist in [facility].

## 2014-05-29 ENCOUNTER — Other Ambulatory Visit: Payer: Self-pay

## 2014-06-05 ENCOUNTER — Encounter: Payer: Self-pay | Admitting: Physician Assistant

## 2014-06-05 ENCOUNTER — Ambulatory Visit (INDEPENDENT_AMBULATORY_CARE_PROVIDER_SITE_OTHER): Payer: 59 | Admitting: Physician Assistant

## 2014-06-05 VITALS — BP 111/65 | HR 77 | Temp 98.4°F | Resp 18 | Ht 65.0 in | Wt 253.1 lb

## 2014-06-05 DIAGNOSIS — J069 Acute upper respiratory infection, unspecified: Secondary | ICD-10-CM

## 2014-06-05 DIAGNOSIS — B9789 Other viral agents as the cause of diseases classified elsewhere: Principal | ICD-10-CM

## 2014-06-05 MED ORDER — HYDROCOD POLST-CHLORPHEN POLST 10-8 MG/5ML PO LQCR: 5.0000 mL | Freq: Two times a day (BID) | ORAL | Status: DC | PRN

## 2014-06-05 NOTE — Assessment & Plan Note (Signed)
Increase fluids.  Rest. Mucinex.  Saline nasal spray.  Humidifier in bedroom. Rx Tussionex for cough.  Return precautions discussed with patient.

## 2014-06-05 NOTE — Patient Instructions (Signed)
Your symptoms seem viral.  Increase your fluid intake.  Rest.  Use Saline nasal spray to clear out sinuses.  Take a daily claritin.  Mucinex for congestion.  Place a humidifier in the bedroom.  Use Tussionex as directed for cough.  Call or return to clinic if symptoms are not improving.

## 2014-06-05 NOTE — Progress Notes (Signed)
Pre visit review using our clinic review tool, if applicable. No additional management support is needed unless otherwise documented below in the visit note/SLS  

## 2014-06-05 NOTE — Progress Notes (Signed)
   History of Present Illness: Joy [name] is a 48 y.o. female who present to the clinic today complaining of sinus pressure and nasal congestion x 2 days. Patient endorses mild cough that is occasionally productive of clear-yellow sputum.  Patient denies fever, chills, recent travel of sick contact.  Has taken Robitussin with little relief in cough.  History: Past Medical History  Diagnosis Date  . Meningitis 2010    hospitalized   Current outpatient prescriptions:vitamin B-12 (CYANOCOBALAMIN) 100 MCG tablet, Take 1 tablet (100 mcg total) by mouth daily., Disp: 30 tablet, Rfl: 3;  chlorpheniramine-HYDROcodone (TUSSIONEX PENNKINETIC ER) 10-8 MG/5ML LQCR, Take 5 mLs by mouth every 12 (twelve) hours as needed., Disp: 115 mL, Rfl: 0 No Known Allergies Family History  Problem Relation Age of Onset  . Stroke Mother   . Blindness Mother     in left eye due to stroke  . Glaucoma Mother     caused blindness of right eye  . Cancer Maternal Aunt 60    ovarian  . Cancer Maternal Grandmother 60    colon  . Diabetes Maternal Aunt   . Diabetes Maternal Aunt    History   Social History  . Marital Status: Married    Spouse Name: [name]    Number of Children: 2  . Years of Education: N/A   Occupational History  . ORDER PROCESSOR    Social History Main Topics  . Smoking status: Never Smoker   . Smokeless tobacco: Never Used  . Alcohol Use: Yes     Comment: sometimes  . Drug Use: No  . Sexual Activity: None   Other Topics Concern  . None   Social History Narrative   Regular exercise:  No         Review of Systems: See HPI.  All other ROS are negative.  Physical Examination: BP 111/65  Pulse 77  Temp(Src) 98.4 F (36.9 C) (Oral)  Resp 18  Ht 5\' 5"  (1.651 m)  Wt 253 lb 2 oz (114.817 kg)  BMI 42.12 kg/m2  SpO2 99%  General appearance: alert, cooperative and appears stated age Head: Normocephalic, without obvious abnormality, atraumatic, Eyes:  conjunctivae/corneas clear. PERRL, EOM's intact. Fundi benign. Ears: normal TM's and external ear canals both ears Nose: mild congestion without significant rhinorrhea.  Sinuses non-tender to percussion. Throat: lips, mucosa, and tongue normal; teeth and gums normal Neck: no adenopathy, no carotid bruit, no JVD, supple, symmetrical, trachea midline and thyroid not enlarged, symmetric, no tenderness/mass/nodules Lungs: clear to auscultation bilaterally Chest wall: no tenderness  Assessment/Plan: Viral URI with cough Increase fluids.  Rest. Mucinex.  Saline nasal spray.  Humidifier in bedroom. Rx Tussionex for cough.  Return precautions discussed with patient.

## 2014-06-12 ENCOUNTER — Telehealth: Payer: Self-pay | Admitting: Family

## 2014-06-12 DIAGNOSIS — J069 Acute upper respiratory infection, unspecified: Secondary | ICD-10-CM

## 2014-06-12 DIAGNOSIS — B9789 Other viral agents as the cause of diseases classified elsewhere: Principal | ICD-10-CM

## 2014-06-12 NOTE — Telephone Encounter (Signed)
Caller name: Tressa  Relation to pt: self  Call back number: [phone] [facility]: [facility] [phone]  Reason for call:   pt states she is feeling a little better but requesting a refill of chlorpheniramine-HYDROcodone (TUSSIONEX PENNKINETIC ER) 10-8 MG/5ML

## 2014-06-15 MED ORDER — HYDROCOD POLST-CHLORPHEN POLST 10-8 MG/5ML PO LQCR: 5.0000 mL | Freq: Two times a day (BID) | ORAL | Status: DC | PRN

## 2014-06-15 NOTE — Telephone Encounter (Signed)
Please inform patient of provider instructions/sls Thanks.

## 2014-06-15 NOTE — Telephone Encounter (Signed)
Pt advised and states understanding

## 2014-06-15 NOTE — Telephone Encounter (Signed)
Will give one refill.  Rx printed.  Will be at front desk.  Patient to schedule follow-up if symptoms persist after this week or if new symptoms develop.

## 2014-06-15 NOTE — Telephone Encounter (Signed)
Medication Detail      Disp Refills Start End     chlorpheniramine-HYDROcodone (TUSSIONEX PENNKINETIC ER) 10-8 MG/5ML LQCR 115 mL 0 06/05/2014     Sig - Route: Take 5 mLs by mouth every 12 (twelve) hours as needed. - Oral    Class: Print     Associated Diagnoses    Viral URI with cough - Primary        PLEASE ADVISE ON PT REQUEST/SLS

## 2014-10-29 ENCOUNTER — Telehealth: Payer: Self-pay | Admitting: Family

## 2014-10-29 NOTE — Telephone Encounter (Signed)
Pre visit CPE letter sent

## 2014-11-17 ENCOUNTER — Telehealth: Payer: Self-pay | Admitting: Family

## 2014-11-17 ENCOUNTER — Other Ambulatory Visit: Payer: Self-pay | Admitting: Family

## 2014-11-17 ENCOUNTER — Ambulatory Visit (INDEPENDENT_AMBULATORY_CARE_PROVIDER_SITE_OTHER): Payer: 59 | Admitting: Family

## 2014-11-17 ENCOUNTER — Encounter: Payer: Self-pay | Admitting: Family

## 2014-11-17 VITALS — BP 128/84 | HR 84 | Temp 98.0°F | Resp 18 | Ht 65.0 in | Wt 266.4 lb

## 2014-11-17 DIAGNOSIS — Z0000 Encounter for general adult medical examination without abnormal findings: Secondary | ICD-10-CM | POA: Diagnosis not present

## 2014-11-17 DIAGNOSIS — R7989 Other specified abnormal findings of blood chemistry: Secondary | ICD-10-CM

## 2014-11-17 DIAGNOSIS — R945 Abnormal results of liver function studies: Secondary | ICD-10-CM

## 2014-11-17 DIAGNOSIS — R319 Hematuria, unspecified: Secondary | ICD-10-CM

## 2014-11-17 LAB — HEPATIC FUNCTION PANEL
ALT: 76 U/L — AB (ref 0–35)
AST: 62 U/L — AB (ref 0–37)
Albumin: 3.9 g/dL (ref 3.5–5.2)
Alkaline Phosphatase: 106 U/L (ref 39–117)
BILIRUBIN TOTAL: 0.4 mg/dL (ref 0.2–1.2)
Bilirubin, Direct: 0 mg/dL (ref 0.0–0.3)
Total Protein: 7.1 g/dL (ref 6.0–8.3)

## 2014-11-17 LAB — CBC WITH DIFFERENTIAL/PLATELET
BASOS PCT: 0.4 % (ref 0.0–3.0)
Basophils Absolute: 0 10*3/uL (ref 0.0–0.1)
EOS PCT: 3.1 % (ref 0.0–5.0)
Eosinophils Absolute: 0.2 10*3/uL (ref 0.0–0.7)
HEMATOCRIT: 35.1 % — AB (ref 36.0–46.0)
HEMOGLOBIN: 11.8 g/dL — AB (ref 12.0–15.0)
LYMPHS PCT: 31.8 % (ref 12.0–46.0)
Lymphs Abs: 2 10*3/uL (ref 0.7–4.0)
MCHC: 33.6 g/dL (ref 30.0–36.0)
MCV: 90.7 fl (ref 78.0–100.0)
Monocytes Absolute: 0.4 10*3/uL (ref 0.1–1.0)
Monocytes Relative: 6.6 % (ref 3.0–12.0)
Neutro Abs: 3.6 10*3/uL (ref 1.4–7.7)
Neutrophils Relative %: 58.1 % (ref 43.0–77.0)
Platelets: 311 10*3/uL (ref 150.0–400.0)
RBC: 3.87 Mil/uL (ref 3.87–5.11)
RDW: 13 % (ref 11.5–15.5)
WBC: 6.3 10*3/uL (ref 4.0–10.5)

## 2014-11-17 LAB — URINALYSIS, ROUTINE W REFLEX MICROSCOPIC
BILIRUBIN URINE: NEGATIVE
Ketones, ur: NEGATIVE
Leukocytes, UA: NEGATIVE
Nitrite: NEGATIVE
PH: 6.5 (ref 5.0–8.0)
Specific Gravity, Urine: 1.02 (ref 1.000–1.030)
Total Protein, Urine: NEGATIVE
URINE GLUCOSE: NEGATIVE
Urobilinogen, UA: 0.2 (ref 0.0–1.0)

## 2014-11-17 LAB — LIPID PANEL
Cholesterol: 240 mg/dL — ABNORMAL HIGH (ref 0–200)
HDL: 47.5 mg/dL (ref >39.00)
LDL CALC: 167 mg/dL — AB (ref 0–99)
NONHDL: 192.5
Total CHOL/HDL Ratio: 5
Triglycerides: 129 mg/dL (ref 0.0–149.0)
VLDL: 25.8 mg/dL (ref 0.0–40.0)

## 2014-11-17 LAB — BASIC METABOLIC PANEL
BUN: 9 mg/dL (ref 6–23)
CALCIUM: 9.5 mg/dL (ref 8.4–10.5)
CHLORIDE: 104 meq/L (ref 96–112)
CO2: 29 mEq/L (ref 19–32)
Creatinine, Ser: 0.95 mg/dL (ref 0.40–1.20)
GFR: 80.48 mL/min (ref >60.00)
Glucose, Bld: 82 mg/dL (ref 70–99)
POTASSIUM: 3.9 meq/L (ref 3.5–5.1)
Sodium: 139 mEq/L (ref 135–145)

## 2014-11-17 LAB — TSH: TSH: 2.61 u[IU]/mL (ref 0.35–4.50)

## 2014-11-17 NOTE — Assessment & Plan Note (Signed)
Noted on today's labs, will check hepatitis panel and obtain abdominal US.

## 2014-11-17 NOTE — Telephone Encounter (Signed)
Urinalysis shows blood in urine.  I would recommend that she be evaluated by urology.  She has had this in the pat.

## 2014-11-17 NOTE — Assessment & Plan Note (Signed)
Immunizations reviewed and up to date.  Advised on healthy diet, exercise, weight loss.  Obtain routine labs. Refer for mammogram.

## 2014-11-17 NOTE — Patient Instructions (Addendum)
Please complete lab work prior to leaving. You will be contacted about your mammogram Try setting up weight loss plan using myfitness pal app on your phone with goal weight loss of 1-2 pounds per week. Try to exercise 30 min 5 days a week- bike/pool etc.   Follow up in 1 year, sooner if problems/concerns.

## 2014-11-17 NOTE — Progress Notes (Signed)
Pre visit review using our clinic review tool, if applicable. No additional management support is needed unless otherwise documented below in the visit note. 

## 2014-11-17 NOTE — Telephone Encounter (Signed)
See mychart.   Shanon Brow could you also ask lab to add on acute hepatitis panel. Dx elevated lft?

## 2014-11-17 NOTE — Progress Notes (Signed)
Subjective:    Patient ID: Joy [name], female    DOB: September 16, 1965, 49 y.o.   MRN: [id number]  HPI  [name] is a 49 yr old female who presents today for cpx.  Immunizations: up to date Diet:  Not working on diet Exercise: can't walk due to pain, ortho rec biking, water walking Colonoscopy: due at 86 Mammogram: due   Review of Systems  Constitutional: Positive for unexpected weight change.  HENT: Positive for rhinorrhea. Negative for hearing loss.   Eyes: Negative for visual disturbance.       Has eye appointment to discuss vision/bured vision in right eye  Respiratory: Negative for cough.   Cardiovascular: Negative for leg swelling.  Gastrointestinal: Negative for nausea, diarrhea and constipation.  Genitourinary: Negative for dysuria and frequency.  Musculoskeletal: Positive for arthralgias. Negative for myalgias.       Occasional knee swelling  Skin: Negative for rash.  Neurological: Negative for headaches.  Hematological: Negative for adenopathy.  Psychiatric/Behavioral: Negative for dysphoric mood and agitation.       Past Medical History  Diagnosis Date  . Meningitis 2010    hospitalized  . Rheumatoid arthritis 2015  . Osteoarthritis 2015    History   Social History  . Marital Status: Married    Spouse Name: Joy [name]  . Number of Children: 2  . Years of Education: N/A   Occupational History  . ORDER PROCESSOR    Social History Main Topics  . Smoking status: Never Smoker   . Smokeless tobacco: Never Used  . Alcohol Use: Yes     Comment: sometimes  . Drug Use: No  . Sexual Activity: Not on file   Other Topics Concern  . Not on file   Social History Narrative   Regular exercise:  No          Past Surgical History  Procedure Laterality Date  . Abdominal hysterectomy  1994    partial  . Tonsillectomy    . Breast surgery  2003    BIOPSY LEFT BREAST--BENIGN    Family History  Problem Relation Age of Onset  . Stroke  Mother   . Blindness Mother     in left eye due to stroke  . Glaucoma Mother     caused blindness of right eye  . Cancer Maternal Aunt 60    ovarian  . Cancer Maternal Grandmother 60    colon  . Diabetes Maternal Aunt   . Diabetes Maternal Aunt     No Known Allergies  Current Outpatient Prescriptions on File Prior to Visit  Medication Sig Dispense Refill  . vitamin B-12 (CYANOCOBALAMIN) 100 MCG tablet Take 1 tablet (100 mcg total) by mouth daily. 30 tablet 3   No current facility-administered medications on file prior to visit.    BP 128/84 mmHg  Pulse 84  Temp(Src) 98 F (36.7 C) (Oral)  Resp 18  Ht 5\' 5"  (1.651 m)  Wt 266 lb 6.4 oz (120.838 kg)  BMI 44.33 kg/m2  SpO2 99%    Objective:   Physical Exam  Physical Exam  Constitutional: She is oriented to person, place, and time. She appears well-developed and well-nourished. No distress.  HENT:  Head: Normocephalic and atraumatic.  Right Ear: Tympanic membrane and ear canal normal.  Left Ear: Tympanic membrane and ear canal normal.  Mouth/Throat: Oropharynx is clear and moist.  Eyes: Pupils are equal, round, and reactive to light. No scleral icterus.  Neck: Normal range of motion. No  thyromegaly present.  Cardiovascular: Normal rate and regular rhythm.   No murmur heard. Pulmonary/Chest: Effort normal and breath sounds normal. No respiratory distress. He has no wheezes. She has no rales. She exhibits no tenderness.  Abdominal: Soft. Bowel sounds are normal. He exhibits no distension and no mass. There is no tenderness. There is no rebound and no guarding.  Musculoskeletal: She exhibits no edema.  Lymphadenopathy:    She has no cervical adenopathy.  Neurological: She is alert and oriented to person, place, and time. She has normal patellar reflexes. She exhibits normal muscle tone. Coordination normal.  Skin: Skin is warm and dry.  Psychiatric: She has a normal mood and affect. Her behavior is normal. Judgment and  thought content normal.  Breasts: Examined lying Right: Without masses, retractions, discharge or axillary adenopathy.  Left: Without masses, retractions, discharge or axillary adenopathy.           Assessment & Plan:          Assessment & Plan:

## 2014-11-18 ENCOUNTER — Telehealth: Payer: Self-pay | Admitting: *Deleted

## 2014-11-18 ENCOUNTER — Encounter: Payer: Self-pay | Admitting: Family

## 2014-11-18 ENCOUNTER — Other Ambulatory Visit (INDEPENDENT_AMBULATORY_CARE_PROVIDER_SITE_OTHER): Payer: 59

## 2014-11-18 DIAGNOSIS — D649 Anemia, unspecified: Secondary | ICD-10-CM | POA: Diagnosis not present

## 2014-11-18 DIAGNOSIS — Z0000 Encounter for general adult medical examination without abnormal findings: Secondary | ICD-10-CM

## 2014-11-18 LAB — HEPATITIS PANEL, ACUTE
HCV Ab: NEGATIVE
HEP A IGM: NONREACTIVE
HEP B C IGM: NONREACTIVE
Hepatitis B Surface Ag: NEGATIVE

## 2014-11-18 LAB — HIV ANTIBODY (ROUTINE TESTING W REFLEX): HIV 1&2 Ab, 4th Generation: NONREACTIVE

## 2014-11-18 LAB — IRON: IRON: 87 ug/dL (ref 42–145)

## 2014-11-18 NOTE — Telephone Encounter (Signed)
Called and added acute hepatitis panel.//AB/CMA

## 2014-11-18 NOTE — Telephone Encounter (Signed)
Received call from [facility] wanting to verify mammogram order. Currently ordered as 3D diagnostic but reason says screening mammogram. Advised her pt 11/17/14 office note should be for screening mammogram. Was pt requesting 3D? If so I can change order, please advise?

## 2014-11-19 ENCOUNTER — Encounter: Payer: Self-pay | Admitting: Family

## 2014-11-19 LAB — VITAMIN B12: Vitamin B-12: 140 pg/mL — ABNORMAL LOW (ref 211–911)

## 2014-11-19 NOTE — Telephone Encounter (Signed)
See attached my chart message.  Please also let pt know that I check b12 level and it is low.  I would recommend that she start b12 injection 1053mcg once weekly x 4 weeks then once monthly.

## 2014-11-20 ENCOUNTER — Telehealth: Payer: Self-pay | Admitting: Family

## 2014-11-20 ENCOUNTER — Encounter: Payer: Self-pay | Admitting: Family

## 2014-11-20 ENCOUNTER — Ambulatory Visit (HOSPITAL_BASED_OUTPATIENT_CLINIC_OR_DEPARTMENT_OTHER)
Admission: RE | Admit: 2014-11-20 | Discharge: 2014-11-20 | Disposition: A | Discharge status: Home / Self Care | Payer: 59 | Source: Ambulatory Visit | Attending: Family | Admitting: Family

## 2014-11-20 ENCOUNTER — Other Ambulatory Visit (INDEPENDENT_AMBULATORY_CARE_PROVIDER_SITE_OTHER): Payer: 59

## 2014-11-20 DIAGNOSIS — R7989 Other specified abnormal findings of blood chemistry: Secondary | ICD-10-CM | POA: Insufficient documentation

## 2014-11-20 DIAGNOSIS — R945 Abnormal results of liver function studies: Secondary | ICD-10-CM

## 2014-11-20 DIAGNOSIS — D649 Anemia, unspecified: Secondary | ICD-10-CM | POA: Diagnosis not present

## 2014-11-20 DIAGNOSIS — E538 Deficiency of other specified B group vitamins: Secondary | ICD-10-CM | POA: Insufficient documentation

## 2014-11-20 DIAGNOSIS — K760 Fatty (change of) liver, not elsewhere classified: Secondary | ICD-10-CM | POA: Insufficient documentation

## 2014-11-20 LAB — FOLATE: FOLATE: 5.2 ng/mL — AB (ref >5.9)

## 2014-11-20 NOTE — Telephone Encounter (Signed)
Notified pt and she voices understanding. Scheduled pt weekly b12 injections x 4 weeks. Advised pt she will need to schedule monthly b12 injection when she completes 4th injection on 12/15/14.

## 2014-11-20 NOTE — Telephone Encounter (Signed)
Folic acid is low.  Has she been taking folic acid supplement?  If not restart please.

## 2014-11-23 NOTE — Telephone Encounter (Signed)
Spoke with pt and she states she had not taken Folic Acid in 30 days prior to having blood work drawn but she has now restarted medication.

## 2014-11-23 NOTE — Telephone Encounter (Signed)
Left message for pt to return my call or check mychart message.

## 2014-11-23 NOTE — Telephone Encounter (Addendum)
Pt returning your call please call [phone].

## 2014-11-24 ENCOUNTER — Ambulatory Visit (INDEPENDENT_AMBULATORY_CARE_PROVIDER_SITE_OTHER): Payer: 59 | Admitting: *Deleted

## 2014-11-24 DIAGNOSIS — E538 Deficiency of other specified B group vitamins: Secondary | ICD-10-CM

## 2014-11-24 MED ORDER — CYANOCOBALAMIN 1000 MCG/ML IJ SOLN
1000.0000 ug | Freq: Once | INTRAMUSCULAR | Status: AC
  Administered 2014-11-24: 1000 ug via INTRAMUSCULAR

## 2014-11-24 NOTE — Progress Notes (Signed)
Pre visit review using our clinic review tool, if applicable. No additional management support is needed unless otherwise documented below in the visit note.  Patient tolerated injection well.  Next appointment previously scheduled.

## 2014-11-25 NOTE — Telephone Encounter (Signed)
Notified [facility] and they are able to see corrected order.

## 2014-11-25 NOTE — Addendum Note (Signed)
Addended by: Debbrah Alar on: 11/25/2014 12:17 PM   Modules accepted: Orders, SmartSet

## 2014-11-25 NOTE — Telephone Encounter (Signed)
Melissa-- please advise? 

## 2014-11-25 NOTE — Telephone Encounter (Signed)
Should be screening, order has been changed.

## 2014-12-01 ENCOUNTER — Other Ambulatory Visit: Payer: 59 | Admitting: Family

## 2014-12-01 ENCOUNTER — Other Ambulatory Visit (INDEPENDENT_AMBULATORY_CARE_PROVIDER_SITE_OTHER): Payer: 59 | Admitting: *Deleted

## 2014-12-01 DIAGNOSIS — E538 Deficiency of other specified B group vitamins: Secondary | ICD-10-CM | POA: Diagnosis not present

## 2014-12-01 MED ORDER — CYANOCOBALAMIN 1000 MCG/ML IJ SOLN
1000.0000 ug | Freq: Once | INTRAMUSCULAR | Status: AC
  Administered 2014-12-01: 1000 ug via INTRAMUSCULAR

## 2014-12-01 MED ORDER — CYANOCOBALAMIN 1000 MCG/ML IJ SOLN: 1000.0000 ug | Freq: Once | INTRAMUSCULAR | Status: DC

## 2014-12-01 NOTE — Progress Notes (Signed)
Pre visit review using our clinic review tool, if applicable. No additional management support is needed unless otherwise documented below in the visit note.  Patient tolerated injection well.  Next injection previously scheduled.

## 2014-12-08 ENCOUNTER — Ambulatory Visit: Payer: 59

## 2014-12-09 ENCOUNTER — Ambulatory Visit (INDEPENDENT_AMBULATORY_CARE_PROVIDER_SITE_OTHER): Payer: 59 | Admitting: *Deleted

## 2014-12-09 DIAGNOSIS — E538 Deficiency of other specified B group vitamins: Secondary | ICD-10-CM

## 2014-12-09 MED ORDER — CYANOCOBALAMIN 1000 MCG/ML IJ SOLN
1000.0000 ug | Freq: Once | INTRAMUSCULAR | Status: AC
  Administered 2014-12-09: 1000 ug via INTRAMUSCULAR

## 2014-12-09 NOTE — Progress Notes (Signed)
Pre visit review using our clinic review tool, if applicable. No additional management support is needed unless otherwise documented below in the visit note.  Patient tolerated injection well.  Next injection previously scheduled.

## 2014-12-15 ENCOUNTER — Ambulatory Visit (INDEPENDENT_AMBULATORY_CARE_PROVIDER_SITE_OTHER): Payer: 59 | Admitting: *Deleted

## 2014-12-15 DIAGNOSIS — E538 Deficiency of other specified B group vitamins: Secondary | ICD-10-CM

## 2014-12-15 MED ORDER — CYANOCOBALAMIN 1000 MCG/ML IJ SOLN
1000.0000 ug | Freq: Once | INTRAMUSCULAR | Status: AC
  Administered 2014-12-15: 1000 ug via INTRAMUSCULAR

## 2014-12-15 NOTE — Progress Notes (Signed)
Pre visit review using our clinic review tool, if applicable. No additional management support is needed unless otherwise documented below in the visit note.  Patient tolerated injection well.  Next injection scheduled for 01/15/15.

## 2014-12-16 ENCOUNTER — Encounter: Payer: Self-pay | Admitting: Family

## 2015-01-15 ENCOUNTER — Ambulatory Visit: Payer: 59

## 2015-01-21 ENCOUNTER — Encounter (HOSPITAL_BASED_OUTPATIENT_CLINIC_OR_DEPARTMENT_OTHER): Payer: Self-pay

## 2015-01-21 ENCOUNTER — Emergency Department (HOSPITAL_BASED_OUTPATIENT_CLINIC_OR_DEPARTMENT_OTHER)
Admission: EM | Admit: 2015-01-21 | Discharge: 2015-01-21 | Disposition: A | Discharge status: Home Health | Payer: 59 | Attending: Emergency Medicine | Admitting: Emergency Medicine

## 2015-01-21 DIAGNOSIS — M069 Rheumatoid arthritis, unspecified: Secondary | ICD-10-CM | POA: Insufficient documentation

## 2015-01-21 DIAGNOSIS — Z79899 Other long term (current) drug therapy: Secondary | ICD-10-CM | POA: Diagnosis not present

## 2015-01-21 DIAGNOSIS — M1990 Unspecified osteoarthritis, unspecified site: Secondary | ICD-10-CM | POA: Insufficient documentation

## 2015-01-21 DIAGNOSIS — Z8661 Personal history of infections of the central nervous system: Secondary | ICD-10-CM | POA: Diagnosis not present

## 2015-01-21 DIAGNOSIS — Z791 Long term (current) use of non-steroidal anti-inflammatories (NSAID): Secondary | ICD-10-CM | POA: Insufficient documentation

## 2015-01-21 DIAGNOSIS — L293 Anogenital pruritus, unspecified: Secondary | ICD-10-CM | POA: Diagnosis present

## 2015-01-21 DIAGNOSIS — N898 Other specified noninflammatory disorders of vagina: Secondary | ICD-10-CM | POA: Diagnosis not present

## 2015-01-21 LAB — URINALYSIS, ROUTINE W REFLEX MICROSCOPIC
Bilirubin Urine: NEGATIVE
Glucose, UA: NEGATIVE mg/dL
KETONES UR: NEGATIVE mg/dL
Leukocytes, UA: NEGATIVE
Nitrite: NEGATIVE
PH: 6 (ref 5.0–8.0)
Protein, ur: NEGATIVE mg/dL
Specific Gravity, Urine: 1.03 (ref 1.005–1.030)
UROBILINOGEN UA: 0.2 mg/dL (ref 0.0–1.0)

## 2015-01-21 LAB — URINE MICROSCOPIC-ADD ON

## 2015-01-21 MED ORDER — HYDROXYZINE HCL 25 MG PO TABS: 25.0000 mg | ORAL_TABLET | Freq: Four times a day (QID) | ORAL | Status: DC

## 2015-01-21 MED ORDER — HYDROCORTISONE 1 % EX CREA: TOPICAL_CREAM | CUTANEOUS | Status: DC

## 2015-01-21 MED ORDER — DIPHENHYDRAMINE HCL 25 MG PO CAPS
25.0000 mg | ORAL_CAPSULE | Freq: Once | ORAL | Status: AC
  Administered 2015-01-21: 25 mg via ORAL
  Filled 2015-01-21: qty 1

## 2015-01-21 NOTE — ED Provider Notes (Signed)
CSN: [id number]     Arrival date & time 01/21/15  0111 History   First MD Initiated Contact with Patient 01/21/15 0128     Chief Complaint  Patient presents with  . Vaginal Itching     (Consider location/radiation/quality/duration/timing/severity/associated sxs/prior Treatment) HPI  Patient presents with vaginal irritation following a urologic procedure one day ago. Patient reports that she had a cystoscopy in the office. She thinks she may have reacted to the cleaning solution that they use prior to instrumentation. She reports itching and swelling of the labia. Denies any vaginal discharge. Patient was unsure if it was procedure related and did start taking Monistat in case it was an yeast infection. Denies any burning or pain with urination. Patient is urinating without difficulty. Denies any fevers.  Past Medical History  Diagnosis Date  . Meningitis 2010    hospitalized  . Rheumatoid arthritis 2015  . Osteoarthritis 2015   Past Surgical History  Procedure Laterality Date  . Abdominal hysterectomy  1994    partial  . Tonsillectomy    . Breast surgery  2003    BIOPSY LEFT BREAST--BENIGN   Family History  Problem Relation Age of Onset  . Stroke Mother   . Blindness Mother     in left eye due to stroke  . Glaucoma Mother     caused blindness of right eye  . Cancer Maternal Aunt 60    ovarian  . Cancer Maternal Grandmother 60    colon  . Diabetes Maternal Aunt   . Diabetes Maternal Aunt    History  Substance Use Topics  . Smoking status: Never Smoker   . Smokeless tobacco: Never Used  . Alcohol Use: Yes     Comment: sometimes   OB History    No data available     Review of Systems  Constitutional: Negative for fever.  Cardiovascular: Negative for chest pain.  Gastrointestinal: Negative for abdominal pain.  Genitourinary: Negative for dysuria, vaginal bleeding, vaginal discharge and difficulty urinating.       Vaginal irritation and itching  Musculoskeletal:  Negative for back pain.  Skin: Positive for rash.  Psychiatric/Behavioral: Negative for confusion.  All other systems reviewed and are negative.     Allergies  Review of patient's allergies indicates no known allergies.  Home Medications   Prior to Admission medications   Medication Sig Start Date End Date Taking? Authorizing Provider  FOLIC ACID PO Take 1 mg by mouth daily.     Historical Provider, MD  hydrocortisone cream 1 % Apply to affected area 2 times daily 01/21/15   Merryl Hacker, MD  hydrOXYzine (ATARAX/VISTARIL) 25 MG tablet Take 1 tablet (25 mg total) by mouth every 6 (six) hours. 01/21/15   Merryl Hacker, MD  meloxicam (MOBIC) 7.5 MG tablet Take 7.5 mg by mouth daily.    Historical Provider, MD  methotrexate (RHEUMATREX) 2.5 MG tablet Take 15 mg by mouth once a week. Caution:Chemotherapy. Protect from light.    Historical Provider, MD  vitamin B-12 (CYANOCOBALAMIN) 100 MCG tablet Take 1 tablet (100 mcg total) by mouth daily. 10/17/13   Debbrah Alar, NP   BP 110/79 mmHg  Pulse 88  Temp(Src) 98.5 F (36.9 C) (Oral)  Resp 22  Ht 5\' 5"  (1.651 m)  Wt 264 lb 8 oz (119.976 kg)  BMI 44.01 kg/m2  SpO2 98% Physical Exam  Constitutional: She is oriented to person, place, and time. She appears well-developed and well-nourished. No distress.  HENT:  Head:  Normocephalic and atraumatic.  Cardiovascular: Normal rate and regular rhythm.   Pulmonary/Chest: Effort normal. No respiratory distress.  Genitourinary:  External exam performed, patient has mild hypertrophy and lichenification over the labia majora, extending into the mons pubis, no significant erythema, scattered fine papules noted, Monistat cream noted at the vaginal introitus  Neurological: She is alert and oriented to person, place, and time.  Skin: Skin is warm and dry.  Psychiatric: She has a normal mood and affect.  Nursing note and vitals reviewed.   ED Course  Procedures (including critical care  time) Labs Review Labs Reviewed  URINALYSIS, ROUTINE W REFLEX MICROSCOPIC (NOT AT [facility]) - Abnormal; Notable for the following:    APPearance TURBID (*)    Hgb urine dipstick MODERATE (*)    All other components within normal limits  URINE MICROSCOPIC-ADD ON - Abnormal; Notable for the following:    Squamous Epithelial / LPF FEW (*)    Bacteria, UA FEW (*)    All other components within normal limits    Imaging Review No results found.   EKG Interpretation None      MDM   Final diagnoses:  Vaginal irritation    Patient presents with vaginal irritation which she relates to recent surgical instrumentation. Nontoxic on exam. No vaginal discharge. Most of her symptoms are external and related to the labia majora and mons pubis. She has some mild skin changes there suggestive of a possible contact dermatitis. Urinalysis without evidence of infection. Patient given Benadryl. She will be discharged with Atarax for itching and can apply topical hydrocortisone cream externally. Patient was encouraged to follow-up with her urologist later today.  After history, exam, and medical workup I feel the patient has been appropriately medically screened and is safe for discharge home. Pertinent diagnoses were discussed with the patient. Patient was given return precautions.     Merryl Hacker, MD 01/21/15 [phone]

## 2015-01-21 NOTE — ED Notes (Signed)
Pt states had a procedure yesterday where they check her bladder, and thinks she is having an allergic reaction to what she was cleaned with; c/o vaginal swelling and itching

## 2015-01-21 NOTE — Discharge Instructions (Signed)
You were seen today for irritation, itching, and swelling in your vaginal area after a procedure. This may be related to dermatitis. You will be given Atarax for itching.  You can also use hydrocortisone cream externally. You should call urologist for further instructions.  Contact Dermatitis Contact dermatitis is a reaction to certain substances that touch the skin. Contact dermatitis can be either irritant contact dermatitis or allergic contact dermatitis. Irritant contact dermatitis does not require previous exposure to the substance for a reaction to occur.Allergic contact dermatitis only occurs if you have been exposed to the substance before. Upon a repeat exposure, your body reacts to the substance.  CAUSES  Many substances can cause contact dermatitis. Irritant dermatitis is most commonly caused by repeated exposure to mildly irritating substances, such as:  Makeup.  Soaps.  Detergents.  Bleaches.  Acids.  Metal salts, such as nickel. Allergic contact dermatitis is most commonly caused by exposure to:  Poisonous plants.  Chemicals (deodorants, shampoos).  Jewelry.  Latex.  Neomycin in triple antibiotic cream.  Preservatives in products, including clothing. SYMPTOMS  The area of skin that is exposed may develop:  Dryness or flaking.  Redness.  Cracks.  Itching.  Pain or a burning sensation.  Blisters. With allergic contact dermatitis, there may also be swelling in areas such as the eyelids, mouth, or genitals.  DIAGNOSIS  Your caregiver can usually tell what the problem is by doing a physical exam. In cases where the cause is uncertain and an allergic contact dermatitis is suspected, a patch skin test may be performed to help determine the cause of your dermatitis. TREATMENT Treatment includes protecting the skin from further contact with the irritating substance by avoiding that substance if possible. Barrier creams, powders, and gloves may be helpful. Your  caregiver may also recommend:  Steroid creams or ointments applied 2 times daily. For best results, soak the rash area in cool water for 20 minutes. Then apply the medicine. Cover the area with a plastic wrap. You can store the steroid cream in the refrigerator for a "chilly" effect on your rash. That may decrease itching. Oral steroid medicines may be needed in more severe cases.  Antibiotics or antibacterial ointments if a skin infection is present.  Antihistamine lotion or an antihistamine taken by mouth to ease itching.  Lubricants to keep moisture in your skin.  Burow's solution to reduce redness and soreness or to dry a weeping rash. Mix one packet or tablet of solution in 2 cups cool water. Dip a clean washcloth in the mixture, wring it out a bit, and put it on the affected area. Leave the cloth in place for 30 minutes. Do this as often as possible throughout the day.  Taking several cornstarch or baking soda baths daily if the area is too large to cover with a washcloth. Harsh chemicals, such as alkalis or acids, can cause skin damage that is like a burn. You should flush your skin for 15 to 20 minutes with cold water after such an exposure. You should also seek immediate medical care after exposure. Bandages (dressings), antibiotics, and pain medicine may be needed for severely irritated skin.  HOME CARE INSTRUCTIONS  Avoid the substance that caused your reaction.  Keep the area of skin that is affected away from hot water, soap, sunlight, chemicals, acidic substances, or anything else that would irritate your skin.  Do not scratch the rash. Scratching may cause the rash to become infected.  You may take cool baths to help  stop the itching.  Only take over-the-counter or prescription medicines as directed by your caregiver.  See your caregiver for follow-up care as directed to make sure your skin is healing properly. SEEK MEDICAL CARE IF:   Your condition is not better after 3  days of treatment.  You seem to be getting worse.  You see signs of infection such as swelling, tenderness, redness, soreness, or warmth in the affected area.  You have any problems related to your medicines. Document Released: 07/28/2000 Document Revised: 10/23/2011 Document Reviewed: 01/03/2011 [facility] Patient Information 2015 [place], [place]. This information is not intended to replace advice given to you by your health care provider. Make sure you discuss any questions you have with your health care provider.

## 2015-05-14 ENCOUNTER — Encounter: Payer: Self-pay | Admitting: Family

## 2015-05-14 ENCOUNTER — Ambulatory Visit (INDEPENDENT_AMBULATORY_CARE_PROVIDER_SITE_OTHER): Payer: 59 | Admitting: Family

## 2015-05-14 VITALS — BP 126/77 | HR 64 | Temp 98.3°F | Resp 16 | Ht 65.0 in | Wt 270.0 lb

## 2015-05-14 DIAGNOSIS — R350 Frequency of micturition: Secondary | ICD-10-CM | POA: Diagnosis not present

## 2015-05-14 DIAGNOSIS — N390 Urinary tract infection, site not specified: Secondary | ICD-10-CM | POA: Diagnosis not present

## 2015-05-14 DIAGNOSIS — R319 Hematuria, unspecified: Secondary | ICD-10-CM

## 2015-05-14 LAB — POCT URINALYSIS DIPSTICK
Bilirubin, UA: NEGATIVE
Glucose, UA: NEGATIVE
KETONES UA: NEGATIVE
Nitrite, UA: NEGATIVE
PH UA: 6
Spec Grav, UA: 1.025
Urobilinogen, UA: NEGATIVE

## 2015-05-14 MED ORDER — NITROFURANTOIN MONOHYD MACRO 100 MG PO CAPS: 100.0000 mg | ORAL_CAPSULE | Freq: Two times a day (BID) | ORAL | Status: DC

## 2015-05-14 NOTE — Progress Notes (Signed)
Pre visit review using our clinic review tool, if applicable. No additional management support is needed unless otherwise documented below in the visit note. 

## 2015-05-14 NOTE — Progress Notes (Signed)
Subjective:    Patient ID: Joy [name], female    DOB: 1966/04/03, 49 y.o.   MRN: [id number]  HPI  Joy [name] is a 49 yr old female who presents today with chief complaint of urinary frequency.  She reports that symptoms began this AM.  Reports associated lower back pain and abdominal. Reports slight spotting of blood on tissue this AM. Denies associated fever. Energy level is OK.    Review of Systems See HPI  Past Medical History  Diagnosis Date  . Meningitis 2010    hospitalized  . Rheumatoid arthritis 2015  . Osteoarthritis 2015    Social History   Social History  . Marital Status: Divorced    Spouse Name: [name]  . Number of Children: 2  . Years of Education: N/A   Occupational History  . ORDER PROCESSOR    Social History Main Topics  . Smoking status: Never Smoker   . Smokeless tobacco: Never Used  . Alcohol Use: Yes     Comment: sometimes  . Drug Use: No  . Sexual Activity: Not on file   Other Topics Concern  . Not on file   Social History Narrative   Regular exercise:  No          Past Surgical History  Procedure Laterality Date  . Abdominal hysterectomy  1994    partial  . Tonsillectomy    . Breast surgery  2003    BIOPSY LEFT BREAST--BENIGN    Family History  Problem Relation Age of Onset  . Stroke Mother   . Blindness Mother     in left eye due to stroke  . Glaucoma Mother     caused blindness of right eye  . Cancer Maternal Aunt 60    ovarian  . Cancer Maternal Grandmother 60    colon  . Diabetes Maternal Aunt   . Diabetes Maternal Aunt     No Known Allergies  Current Outpatient Prescriptions on File Prior to Visit  Medication Sig Dispense Refill  . FOLIC ACID PO Take 1 mg by mouth daily.     . hydrocortisone cream 1 % Apply to affected area 2 times daily 15 g 0  . hydrOXYzine (ATARAX/VISTARIL) 25 MG tablet Take 1 tablet (25 mg total) by mouth every 6 (six) hours. 12 tablet 0  . meloxicam (MOBIC) 7.5 MG  tablet Take 7.5 mg by mouth daily.    . methotrexate (RHEUMATREX) 2.5 MG tablet Take 15 mg by mouth once a week. Caution:Chemotherapy. Protect from light.    . vitamin B-12 (CYANOCOBALAMIN) 100 MCG tablet Take 1 tablet (100 mcg total) by mouth daily. 30 tablet 3   No current facility-administered medications on file prior to visit.    BP 126/77 mmHg  Pulse 64  Temp(Src) 98.3 F (36.8 C) (Oral)  Resp 16  Ht 5\' 5"  (1.651 m)  Wt 270 lb (122.471 kg)  BMI 44.93 kg/m2  SpO2 100%       Objective:   Physical Exam  Constitutional: She appears well-developed and well-nourished.  Cardiovascular: Normal rate, regular rhythm and normal heart sounds.   No murmur heard. Pulmonary/Chest: Effort normal and breath sounds normal. No respiratory distress. She has no wheezes.  Abdominal:  Neg suprapubic tenderness to palpation  Genitourinary:  Neg CVAT  Psychiatric: She has a normal mood and affect. Her behavior is normal. Judgment and thought content normal.          Assessment & Plan:  UTI- Urinalysis  shows 3+ leuks, 3+ blood 2+ protein.  Will rx with macrodantin. Pt advised to call if symptoms worsen or if not improved in 2-3 days.   Pt declines flu shot.

## 2015-05-14 NOTE — Patient Instructions (Signed)
Start macrodantin (antibiotic) for urinary tract infection. Call if symptoms worsen, if you develop fever >101, or if not improved in 2-3 days.

## 2015-05-17 ENCOUNTER — Encounter: Payer: Self-pay | Admitting: Family

## 2015-05-17 LAB — URINE CULTURE: Colony Count: >=100000

## 2015-05-23 ENCOUNTER — Encounter (HOSPITAL_BASED_OUTPATIENT_CLINIC_OR_DEPARTMENT_OTHER): Payer: Self-pay | Admitting: Emergency Medicine

## 2015-05-23 ENCOUNTER — Emergency Department (HOSPITAL_BASED_OUTPATIENT_CLINIC_OR_DEPARTMENT_OTHER)
Admission: EM | Admit: 2015-05-23 | Discharge: 2015-05-23 | Disposition: A | Discharge status: Home / Self Care | Payer: 59 | Attending: Emergency Medicine | Admitting: Emergency Medicine

## 2015-05-23 DIAGNOSIS — Z791 Long term (current) use of non-steroidal anti-inflammatories (NSAID): Secondary | ICD-10-CM | POA: Insufficient documentation

## 2015-05-23 DIAGNOSIS — Z79899 Other long term (current) drug therapy: Secondary | ICD-10-CM | POA: Insufficient documentation

## 2015-05-23 DIAGNOSIS — Z7952 Long term (current) use of systemic steroids: Secondary | ICD-10-CM | POA: Insufficient documentation

## 2015-05-23 DIAGNOSIS — L02211 Cutaneous abscess of abdominal wall: Secondary | ICD-10-CM | POA: Diagnosis not present

## 2015-05-23 DIAGNOSIS — L0390 Cellulitis, unspecified: Secondary | ICD-10-CM

## 2015-05-23 DIAGNOSIS — M1990 Unspecified osteoarthritis, unspecified site: Secondary | ICD-10-CM | POA: Diagnosis not present

## 2015-05-23 DIAGNOSIS — L03311 Cellulitis of abdominal wall: Secondary | ICD-10-CM | POA: Diagnosis not present

## 2015-05-23 DIAGNOSIS — R21 Rash and other nonspecific skin eruption: Secondary | ICD-10-CM | POA: Diagnosis present

## 2015-05-23 DIAGNOSIS — Z8669 Personal history of other diseases of the nervous system and sense organs: Secondary | ICD-10-CM | POA: Insufficient documentation

## 2015-05-23 DIAGNOSIS — L0291 Cutaneous abscess, unspecified: Secondary | ICD-10-CM

## 2015-05-23 MED ORDER — CLINDAMYCIN HCL 150 MG PO CAPS: 300.0000 mg | ORAL_CAPSULE | Freq: Three times a day (TID) | ORAL | Status: DC

## 2015-05-23 MED ORDER — LIDOCAINE-EPINEPHRINE 2 %-1:100000 IJ SOLN
1.7000 mL | Freq: Once | INTRAMUSCULAR | Status: AC
  Administered 2015-05-23: 1.7 mL via INTRADERMAL
  Filled 2015-05-23: qty 1

## 2015-05-23 NOTE — Discharge Instructions (Signed)
Abscess °An abscess is an infected area that contains a collection of pus and debris. It can occur in almost any part of the body. An abscess is also known as a furuncle or boil. °CAUSES  °An abscess occurs when tissue gets infected. This can occur from blockage of oil or sweat glands, infection of hair follicles, or a minor injury to the skin. As the body tries to fight the infection, pus collects in the area and creates pressure under the skin. This pressure causes pain. People with weakened immune systems have difficulty fighting infections and get certain abscesses more often.  °SYMPTOMS °Usually an abscess develops on the skin and becomes a painful mass that is red, warm, and tender. If the abscess forms under the skin, you may feel a moveable soft area under the skin. Some abscesses break open (rupture) on their own, but most will continue to get worse without care. The infection can spread deeper into the body and eventually into the bloodstream, causing you to feel ill.  °DIAGNOSIS  °Your caregiver will take your medical history and perform a physical exam. A sample of fluid may also be taken from the abscess to determine what is causing your infection. °TREATMENT  °Your caregiver may prescribe antibiotic medicines to fight the infection. However, taking antibiotics alone usually does not cure an abscess. Your caregiver may need to make a small cut (incision) in the abscess to drain the pus. In some cases, gauze is packed into the abscess to reduce pain and to continue draining the area. °HOME CARE INSTRUCTIONS  °· Only take over-the-counter or prescription medicines for pain, discomfort, or fever as directed by your caregiver. °· If you were prescribed antibiotics, take them as directed. Finish them even if you start to feel better. °· If gauze is used, follow your caregiver's directions for changing the gauze. °· To avoid spreading the infection: °· Keep your draining abscess covered with a  bandage. °· Wash your hands well. °· Do not share personal care items, towels, or whirlpools with others. °· Avoid skin contact with others. °· Keep your skin and clothes clean around the abscess. °· Keep all follow-up appointments as directed by your caregiver. °SEEK MEDICAL CARE IF:  °· You have increased pain, swelling, redness, fluid drainage, or bleeding. °· You have muscle aches, chills, or a general ill feeling. °· You have a fever. °MAKE SURE YOU:  °· Understand these instructions. °· Will watch your condition. °· Will get help right away if you are not doing well or get worse. °  °This information is not intended to replace advice given to you by your health care provider. Make sure you discuss any questions you have with your health care provider. °  °Document Released: 05/10/2005 Document Revised: 01/30/2012 Document Reviewed: 10/13/2011 °Elsevier Interactive Patient Education ©2016 Elsevier Inc. ° °Cellulitis °Cellulitis is an infection of the skin and the tissue beneath it. The infected area is usually red and tender. Cellulitis occurs most often in the arms and lower legs.  °CAUSES  °Cellulitis is caused by bacteria that enter the skin through cracks or cuts in the skin. The most common types of bacteria that cause cellulitis are staphylococci and streptococci. °SIGNS AND SYMPTOMS  °· Redness and warmth. °· Swelling. °· Tenderness or pain. °· Fever. °DIAGNOSIS  °Your health care provider can usually determine what is wrong based on a physical exam. Blood tests may also be done. °TREATMENT  °Treatment usually involves taking an antibiotic medicine. °HOME CARE INSTRUCTIONS  °·   Take your antibiotic medicine as directed by your health care provider. Finish the antibiotic even if you start to feel better.  Keep the infected arm or leg elevated to reduce swelling.  Apply a warm cloth to the affected area up to 4 times per day to relieve pain.  Take medicines only as directed by your health care  provider.  Keep all follow-up visits as directed by your health care provider. SEEK MEDICAL CARE IF:   You notice red streaks coming from the infected area.  Your red area gets larger or turns dark in color.  Your bone or joint underneath the infected area becomes painful after the skin has healed.  Your infection returns in the same area or another area.  You notice a swollen bump in the infected area.  You develop new symptoms.  You have a fever. SEEK IMMEDIATE MEDICAL CARE IF:   You feel very sleepy.  You develop vomiting or diarrhea.  You have a general ill feeling (malaise) with muscle aches and pains.   This information is not intended to replace advice given to you by your health care provider. Make sure you discuss any questions you have with your health care provider.   Document Released: 05/10/2005 Document Revised: 04/21/2015 Document Reviewed: 10/16/2011 Elsevier Interactive Patient Education [employer].

## 2015-05-23 NOTE — ED Notes (Signed)
Pt c/o "boil" and reddened rash on lower mid abdomen. Painful to touch, non-itching, no drainage noted.

## 2015-05-23 NOTE — ED Notes (Addendum)
Pt not discharged at 1109, departure condition of 1109 entered in error.

## 2015-05-23 NOTE — ED Notes (Signed)
Dr Laneta Simmers performed I&D at bedside.

## 2015-05-24 ENCOUNTER — Telehealth: Payer: Self-pay | Admitting: Family

## 2015-05-24 NOTE — ED Provider Notes (Addendum)
CSN: [id number]     Arrival date & time 05/23/15  8366 History   First MD Initiated Contact with Patient 05/23/15 1008     Chief Complaint  Patient presents with  . Rash     (Consider location/radiation/quality/duration/timing/severity/associated sxs/prior Treatment) Patient is a 49 y.o. female presenting with abscess. The history is provided by the patient.  Abscess Location:  Torso Torso abscess location:  Abd LLQ Size:  2 cm Abscess quality: induration, itching and redness   Red streaking: no   Progression:  Worsening Chronicity:  New Context: not diabetes and not injected drug use   Relieved by:  Nothing Worsened by:  Nothing tried Ineffective treatments:  None tried Associated symptoms: no fever, no nausea and no vomiting   Risk factors: prior abscess     Past Medical History  Diagnosis Date  . Meningitis 2010    hospitalized  . Rheumatoid arthritis ([place]) 2015  . Osteoarthritis 2015   Past Surgical History  Procedure Laterality Date  . Abdominal hysterectomy  1994    partial  . Tonsillectomy    . Breast surgery  2003    BIOPSY LEFT BREAST--BENIGN  . Foot surgery Right    Family History  Problem Relation Age of Onset  . Stroke Mother   . Blindness Mother     in left eye due to stroke  . Glaucoma Mother     caused blindness of right eye  . Cancer Maternal Aunt 60    ovarian  . Cancer Maternal Grandmother 60    colon  . Diabetes Maternal Aunt   . Diabetes Maternal Aunt    Social History  Substance Use Topics  . Smoking status: Never Smoker   . Smokeless tobacco: Never Used  . Alcohol Use: Yes     Comment: sometimes   OB History    No data available     Review of Systems  Constitutional: Negative for fever.  Gastrointestinal: Negative for nausea and vomiting.  All other systems reviewed and are negative.     Allergies  Review of patient's allergies indicates no known allergies.  Home Medications   Prior to Admission medications    Medication Sig Start Date End Date Taking? Authorizing Provider  clindamycin (CLEOCIN) 150 MG capsule Take 2 capsules (300 mg total) by mouth 3 (three) times daily. 05/23/15   Leo Grosser, MD  FOLIC ACID PO Take 1 mg by mouth daily.     Historical Provider, MD  hydrocortisone cream 1 % Apply to affected area 2 times daily 01/21/15   Merryl Hacker, MD  hydrOXYzine (ATARAX/VISTARIL) 25 MG tablet Take 1 tablet (25 mg total) by mouth every 6 (six) hours. 01/21/15   Merryl Hacker, MD  meloxicam (MOBIC) 7.5 MG tablet Take 7.5 mg by mouth daily.    Historical Provider, MD  methotrexate (RHEUMATREX) 2.5 MG tablet Take 15 mg by mouth once a week. Caution:Chemotherapy. Protect from light.    Historical Provider, MD  vitamin B-12 (CYANOCOBALAMIN) 100 MCG tablet Take 1 tablet (100 mcg total) by mouth daily. 10/17/13   Debbrah Alar, NP   BP 104/64 mmHg  Pulse 84  Temp(Src) 98.2 F (36.8 C) (Oral)  Resp 18  Ht 5\' 5"  (1.651 m)  Wt 258 lb (117.028 kg)  BMI 42.93 kg/m2  SpO2 97% Physical Exam  Constitutional: She is oriented to person, place, and time. She appears well-developed and well-nourished. No distress.  HENT:  Head: Normocephalic.  Eyes: Conjunctivae are normal.  Neck: Neck  supple. No tracheal deviation present.  Cardiovascular: Normal rate and regular rhythm.   Pulmonary/Chest: Effort normal. No respiratory distress.  Abdominal: Soft. She exhibits no distension.  Neurological: She is alert and oriented to person, place, and time.  Skin: Skin is warm and dry. Rash (abscess over lower midabdomen with induration and extending erythema) noted.     Psychiatric: She has a normal mood and affect.    ED Course  Procedures (including critical care time) INCISION AND DRAINAGE Performed by: Leo Grosser Consent: Verbal consent obtained. Risks and benefits: risks, benefits and alternatives were discussed Type: abscess  Body area: anterior abdomen  Anesthesia: local  infiltration  Incision was made with a scalpel.  Local anesthetic: lidocaine 2% w epinephrine  Anesthetic total: 5 ml  Complexity: complex Blunt dissection to break up loculations  Drainage: purulent  Drainage amount: minimal  Packing material: none  Patient tolerance: Patient tolerated the procedure well with no immediate complications.   Labs Review Labs Reviewed - No data to display  Imaging Review No results found. I have personally reviewed and evaluated these images and lab results as part of my medical decision-making.   EKG Interpretation None      MDM   Final diagnoses:  Abscess and cellulitis    Patient presents with abscess. Located over lower central abdomen. Has history of same. Drained as above with good relief of pressure after local anesthesia. Overlying cellulitis appreciated on exam and antibiotics were prescribed. Wound left open to drain and return precautions discussed for wound recheck.    Leo Grosser, MD 05/24/15 2135

## 2015-05-24 NOTE — Telephone Encounter (Signed)
Please contact pt to arrange ED follow up with me. OK to put in a 15 min slot.  

## 2015-05-25 NOTE — Telephone Encounter (Signed)
Scheduled for 05/26/15

## 2015-05-26 ENCOUNTER — Encounter: Payer: Self-pay | Admitting: Family

## 2015-05-26 ENCOUNTER — Ambulatory Visit (INDEPENDENT_AMBULATORY_CARE_PROVIDER_SITE_OTHER): Payer: 59 | Admitting: Family

## 2015-05-26 VITALS — BP 101/61 | HR 83 | Temp 98.1°F | Ht 65.0 in | Wt 271.6 lb

## 2015-05-26 DIAGNOSIS — L03311 Cellulitis of abdominal wall: Secondary | ICD-10-CM

## 2015-05-26 NOTE — Patient Instructions (Signed)
Continue clindamycin. Watch the area carefully- call me if you develop increased redness/pain swelling, or fever >101. Follow up on Friday.

## 2015-05-26 NOTE — Progress Notes (Signed)
Pre visit review using our clinic review tool, if applicable. No additional management support is needed unless otherwise documented below in the visit note. 

## 2015-05-26 NOTE — Progress Notes (Signed)
Subjective:    Patient ID: Joy [name], female    DOB: 1966/05/16, 49 y.o.   MRN: [id number]  HPI   Joy [name] is a 49 yr old female who presents today for ED follow up.  The patient was evaluated in the ED on 10/9 for abscess on her lower abdomen. ED record is reviewed.  She underwent I and D and was sent home with clindamycin.  She just started antibiotics yesterday due to finances.  She reports that the area initially seemed to worsen but is now a bit better.      Review of Systems Reports + lower abdominal tenderness which is worse with certain positions while sleeping.   Past Medical History  Diagnosis Date  . Meningitis 2010    hospitalized  . Rheumatoid arthritis ([place]) 2015  . Osteoarthritis 2015    Social History   Social History  . Marital Status: Divorced    Spouse Name: Joy [name]  . Number of Children: 2  . Years of Education: N/A   Occupational History  . ORDER PROCESSOR    Social History Main Topics  . Smoking status: Never Smoker   . Smokeless tobacco: Never Used  . Alcohol Use: Yes     Comment: sometimes  . Drug Use: No  . Sexual Activity: Not on file   Other Topics Concern  . Not on file   Social History Narrative   Regular exercise:  No          Past Surgical History  Procedure Laterality Date  . Abdominal hysterectomy  1994    partial  . Tonsillectomy    . Breast surgery  2003    BIOPSY LEFT BREAST--BENIGN  . Foot surgery Right     Family History  Problem Relation Age of Onset  . Stroke Mother   . Blindness Mother     in left eye due to stroke  . Glaucoma Mother     caused blindness of right eye  . Cancer Maternal Aunt 60    ovarian  . Cancer Maternal Grandmother 60    colon  . Diabetes Maternal Aunt   . Diabetes Maternal Aunt     No Known Allergies  Current Outpatient Prescriptions on File Prior to Visit  Medication Sig Dispense Refill  . clindamycin (CLEOCIN) 150 MG capsule Take 2 capsules (300 mg total)  by mouth 3 (three) times daily. 42 capsule 0  . FOLIC ACID PO Take 1 mg by mouth daily.     . hydrocortisone cream 1 % Apply to affected area 2 times daily 15 g 0  . meloxicam (MOBIC) 7.5 MG tablet Take 7.5 mg by mouth daily.    . methotrexate (RHEUMATREX) 2.5 MG tablet Take 15 mg by mouth once a week. Caution:Chemotherapy. Protect from light.    . vitamin B-12 (CYANOCOBALAMIN) 100 MCG tablet Take 1 tablet (100 mcg total) by mouth daily. 30 tablet 3   No current facility-administered medications on file prior to visit.    BP 101/61 mmHg  Pulse 83  Temp(Src) 98.1 F (36.7 C) (Oral)  Ht 5\' 5"  (1.651 m)  Wt 271 lb 9.6 oz (123.197 kg)  BMI 45.20 kg/m2  SpO2 99%       Objective:   Physical Exam  Constitutional: She is oriented to person, place, and time. She appears well-developed and well-nourished. No distress.  Neurological: She is alert and oriented to person, place, and time.  Skin: Skin is warm and dry.  +  induration below umbilicus in abdominal skin fold. Small opening with scant serous drainage at I and D site. No fluctuance  Psychiatric: She has a normal mood and affect. Her behavior is normal. Judgment and thought content normal.          Assessment & Plan:  Cellulitis- she is s/p I and D and has only been on abx x 24 hours.  I think at this point she needs more time on the abx.  Pt will return on Friday for follow up. She verbalizes understanding.

## 2015-05-28 ENCOUNTER — Ambulatory Visit (INDEPENDENT_AMBULATORY_CARE_PROVIDER_SITE_OTHER): Payer: 59 | Admitting: Family

## 2015-05-28 ENCOUNTER — Encounter: Payer: Self-pay | Admitting: Family

## 2015-05-28 ENCOUNTER — Telehealth: Payer: Self-pay | Admitting: Family

## 2015-05-28 VITALS — BP 106/65 | HR 85 | Temp 98.4°F | Resp 16 | Ht 65.0 in | Wt 270.2 lb

## 2015-05-28 DIAGNOSIS — L03311 Cellulitis of abdominal wall: Secondary | ICD-10-CM | POA: Diagnosis not present

## 2015-05-28 NOTE — Progress Notes (Signed)
Pre visit review using our clinic review tool, if applicable. No additional management support is needed unless otherwise documented below in the visit note. 

## 2015-05-28 NOTE — Telephone Encounter (Signed)
Spoke to pt. She tells me that she saw surgeon and that surgeon did not feel that she needed any I and D at this time but to continue abx and follow up with her on Monday.  Advised pt to keep surgical apt on Monday. She verbalizes understanding.

## 2015-05-28 NOTE — Progress Notes (Signed)
Subjective:    Patient ID: Joy [name], female    DOB: September 10, 1965, 49 y.o.   MRN: [id number]  HPI  Joy [name] is a 49 yr old female who presents today for follow up of her abdominal wall cellulitis. She was seen on 10/12 in ED follow up but had only been on abx for 24 hours at that time.     Review of Systems    see HPI  Past Medical History  Diagnosis Date  . Meningitis 2010    hospitalized  . Rheumatoid arthritis ([place]) 2015  . Osteoarthritis 2015    Social History   Social History  . Marital Status: Divorced    Spouse Name: [name]  . Number of Children: 2  . Years of Education: N/A   Occupational History  . ORDER PROCESSOR    Social History Main Topics  . Smoking status: Never Smoker   . Smokeless tobacco: Never Used  . Alcohol Use: Yes     Comment: sometimes  . Drug Use: No  . Sexual Activity: Not on file   Other Topics Concern  . Not on file   Social History Narrative   Regular exercise:  No          Past Surgical History  Procedure Laterality Date  . Abdominal hysterectomy  1994    partial  . Tonsillectomy    . Breast surgery  2003    BIOPSY LEFT BREAST--BENIGN  . Foot surgery Right     Family History  Problem Relation Age of Onset  . Stroke Mother   . Blindness Mother     in left eye due to stroke  . Glaucoma Mother     caused blindness of right eye  . Cancer Maternal Aunt 60    ovarian  . Cancer Maternal Grandmother 60    colon  . Diabetes Maternal Aunt   . Diabetes Maternal Aunt     No Known Allergies  Current Outpatient Prescriptions on File Prior to Visit  Medication Sig Dispense Refill  . clindamycin (CLEOCIN) 150 MG capsule Take 2 capsules (300 mg total) by mouth 3 (three) times daily. 42 capsule 0  . FOLIC ACID PO Take 1 mg by mouth daily.     . hydrocortisone cream 1 % Apply to affected area 2 times daily 15 g 0  . meloxicam (MOBIC) 7.5 MG tablet Take 7.5 mg by mouth daily.    . methotrexate  (RHEUMATREX) 2.5 MG tablet Take 15 mg by mouth once a week. Caution:Chemotherapy. Protect from light.    . vitamin B-12 (CYANOCOBALAMIN) 100 MCG tablet Take 1 tablet (100 mcg total) by mouth daily. 30 tablet 3   No current facility-administered medications on file prior to visit.    BP 106/65 mmHg  Pulse 85  Temp(Src) 98.4 F (36.9 C) (Oral)  Resp 16  Ht 5\' 5"  (1.651 m)  Wt 270 lb 3.2 oz (122.562 kg)  BMI 44.96 kg/m2  SpO2 99%    Objective:   Physical Exam  Constitutional: She appears well-developed and well-nourished.  Abdominal: Soft.  Musculoskeletal: She exhibits no edema.  Skin:  Lower abdominal cellulitis- less erythema today, very indurated and tender, no fluctuance.  Psychiatric: She has a normal mood and affect. Her behavior is normal. Judgment and thought content normal.          Assessment & Plan:  Cellullitis- minimal improvement, refer to surgeon today for further evaluation and possible repeat I and D.  Continue clindamycin.

## 2015-08-15 HISTORY — PX: KNEE ARTHROSCOPY: SUR90

## 2015-08-15 HISTORY — PX: KNEE SURGERY: SHX244

## 2015-09-29 ENCOUNTER — Other Ambulatory Visit (HOSPITAL_COMMUNITY)
Admission: RE | Admit: 2015-09-29 | Discharge: 2015-09-29 | Disposition: A | Discharge status: Home / Self Care | Payer: 59 | Source: Ambulatory Visit | Attending: Physician Assistant | Admitting: Physician Assistant

## 2015-09-29 ENCOUNTER — Encounter: Payer: Self-pay | Admitting: Physician Assistant

## 2015-09-29 ENCOUNTER — Ambulatory Visit (INDEPENDENT_AMBULATORY_CARE_PROVIDER_SITE_OTHER): Payer: 59 | Admitting: Physician Assistant

## 2015-09-29 VITALS — BP 124/76 | HR 87 | Temp 98.3°F | Ht 65.0 in | Wt 270.8 lb

## 2015-09-29 DIAGNOSIS — N898 Other specified noninflammatory disorders of vagina: Secondary | ICD-10-CM | POA: Insufficient documentation

## 2015-09-29 DIAGNOSIS — N760 Acute vaginitis: Secondary | ICD-10-CM | POA: Insufficient documentation

## 2015-09-29 LAB — POC URINALSYSI DIPSTICK (AUTOMATED)
Bilirubin, UA: NEGATIVE
GLUCOSE UA: NEGATIVE
Ketones, UA: NEGATIVE
Nitrite, UA: NEGATIVE
Spec Grav, UA: >=1.03
UROBILINOGEN UA: 0.2
pH, UA: 6

## 2015-09-29 LAB — URINALYSIS, MICROSCOPIC ONLY

## 2015-09-29 MED ORDER — FLUCONAZOLE 150 MG PO TABS: 150.0000 mg | ORAL_TABLET | Freq: Once | ORAL | Status: DC

## 2015-09-29 NOTE — Progress Notes (Signed)
Pre visit review using our clinic review tool, if applicable. No additional management support is needed unless otherwise documented below in the visit note. 

## 2015-09-29 NOTE — Assessment & Plan Note (Signed)
Thick white discharge on exam. Giving history of yeast, will treat with Diflucan. Urine sent for micro and culture. Wet prep sent. Start a probiotic. Will alter regimen based on results.

## 2015-09-29 NOTE — Progress Notes (Signed)
Patient presents to clinic today c/o brown discharge from the vagina x 1 week. Denies pain, pruritus or odor. Is s/p hysterectomy. Endorses some urinary frequency but denies urgency, hematuria. Denies fever, chills, fatigue. Patient is not currently sexually active and states her last interaction was in December.   Past Medical History  Diagnosis Date  . Meningitis 2010    hospitalized  . Rheumatoid arthritis ([place]) 2015  . Osteoarthritis 2015    Current Outpatient Prescriptions on File Prior to Visit  Medication Sig Dispense Refill  . hydrocortisone cream 1 % Apply to affected area 2 times daily 15 g 0  . meloxicam (MOBIC) 7.5 MG tablet Take 7.5 mg by mouth daily.    . methotrexate (RHEUMATREX) 2.5 MG tablet Take 15 mg by mouth once a week. Caution:Chemotherapy. Protect from light.    . vitamin B-12 (CYANOCOBALAMIN) 100 MCG tablet Take 1 tablet (100 mcg total) by mouth daily. 30 tablet 3   No current facility-administered medications on file prior to visit.    No Known Allergies  Family History  Problem Relation Age of Onset  . Stroke Mother   . Blindness Mother     in left eye due to stroke  . Glaucoma Mother     caused blindness of right eye  . Cancer Maternal Aunt 60    ovarian  . Cancer Maternal Grandmother 60    colon  . Diabetes Maternal Aunt   . Diabetes Maternal Aunt     Social History   Social History  . Marital Status: Divorced    Spouse Name: [name]  . Number of Children: 2  . Years of Education: N/A   Occupational History  . ORDER PROCESSOR    Social History Main Topics  . Smoking status: Never Smoker   . Smokeless tobacco: Never Used  . Alcohol Use: Yes     Comment: sometimes  . Drug Use: No  . Sexual Activity: Not Asked   Other Topics Concern  . None   Social History Narrative   Regular exercise:  No         Review of Systems - See HPI.  All other ROS are negative.  BP 124/76 mmHg  Pulse 87  Temp(Src) 98.3 F (36.8 C) (Oral)   Ht 5\' 5"  (1.651 m)  Wt 270 lb 12.8 oz (122.834 kg)  BMI 45.06 kg/m2  SpO2 98%  Physical Exam  Constitutional: She is oriented to person, place, and time and well-developed, well-nourished, and in no distress.  HENT:  Head: Normocephalic and atraumatic.  Cardiovascular: Normal rate, regular rhythm, normal heart sounds and intact distal pulses.   Pulmonary/Chest: Effort normal and breath sounds normal. No respiratory distress. She has no wheezes. She has no rales. She exhibits no tenderness.  Abdominal: Soft. Bowel sounds are normal. She exhibits no distension. There is no tenderness.  Genitourinary: Right adnexa normal, left adnexa normal and vulva normal. Thick  odorless  white and vaginal discharge found.  Uterus and cervix surgically absent. No brown discharge noted on examination. Only thick white discharge. No pelvic pain or tenderness on examination.  Neurological: She is alert and oriented to person, place, and time.  Skin: Skin is warm and dry. No rash noted.  Psychiatric: Affect normal.  Vitals reviewed.   No results found for this or any previous visit (from the past 2160 hour(s)).  Assessment/Plan: Vaginal discharge Thick white discharge on exam. Giving history of yeast, will treat with Diflucan. Urine sent for micro and  culture. Wet prep sent. Start a probiotic. Will alter regimen based on results.

## 2015-09-29 NOTE — Patient Instructions (Signed)
Your exam is good. There is some mild thick white discharge, concerning for yeast but no brown discharge is visualized. Increase fluids and start a daily probiotic. Take the Diflucan as directed.  I am sending your urine for culture and the discharge for testing. I will call you with your results and we will change treatment.

## 2015-09-30 ENCOUNTER — Telehealth: Payer: Self-pay | Admitting: Physician Assistant

## 2015-09-30 LAB — CULTURE, URINE COMPREHENSIVE
COLONY COUNT: NO GROWTH
Organism ID, Bacteria: NO GROWTH

## 2015-09-30 LAB — CERVICOVAGINAL ANCILLARY ONLY: Wet Prep (BD Affirm): POSITIVE — AB

## 2015-09-30 MED ORDER — TINIDAZOLE 500 MG PO TABS: 2.0000 g | ORAL_TABLET | Freq: Once | ORAL | Status: DC

## 2015-09-30 NOTE — Telephone Encounter (Signed)
Pt notified of results and verbalized understanding  

## 2015-09-30 NOTE — Telephone Encounter (Signed)
Have gotten preliminary results back from her vaginal wet prep. Still waiting on yeast and BV results but wet prep came back positive for trichomoniasis which is considered an STD. We need to treat this to resolve her discharge and to prevent worsening infection. I have sent in a medication called Tinidazole for her to take as directed to cure infection. Will call once final results are in.

## 2015-10-25 ENCOUNTER — Telehealth: Payer: Self-pay | Admitting: Physician Assistant

## 2015-10-25 DIAGNOSIS — N898 Other specified noninflammatory disorders of vagina: Secondary | ICD-10-CM

## 2015-10-25 NOTE — Telephone Encounter (Signed)
Pt called to f/u on med request. States she has continued symptoms from her visit in February.

## 2015-10-25 NOTE — Telephone Encounter (Signed)
She would need to come in for reassessment either with myself of her PCP if treatment did not resolve symptoms. Do not refill antibiotics.

## 2015-10-25 NOTE — Telephone Encounter (Signed)
Notified pt and scheduled appt for 3/14

## 2015-10-26 ENCOUNTER — Other Ambulatory Visit (HOSPITAL_COMMUNITY)
Admission: RE | Admit: 2015-10-26 | Discharge: 2015-10-26 | Disposition: A | Discharge status: Home / Self Care | Payer: 59 | Source: Ambulatory Visit | Attending: Family | Admitting: Family

## 2015-10-26 ENCOUNTER — Ambulatory Visit (INDEPENDENT_AMBULATORY_CARE_PROVIDER_SITE_OTHER): Payer: 59 | Admitting: Family

## 2015-10-26 ENCOUNTER — Encounter: Payer: Self-pay | Admitting: Family

## 2015-10-26 VITALS — BP 132/80 | HR 71 | Temp 98.3°F | Resp 16 | Ht 65.0 in | Wt 270.8 lb

## 2015-10-26 DIAGNOSIS — N898 Other specified noninflammatory disorders of vagina: Secondary | ICD-10-CM | POA: Diagnosis not present

## 2015-10-26 DIAGNOSIS — N760 Acute vaginitis: Secondary | ICD-10-CM | POA: Insufficient documentation

## 2015-10-26 DIAGNOSIS — Z113 Encounter for screening for infections with a predominantly sexual mode of transmission: Secondary | ICD-10-CM | POA: Diagnosis not present

## 2015-10-26 MED ORDER — FLUCONAZOLE 150 MG PO TABS: ORAL_TABLET | ORAL | Status: DC

## 2015-10-26 NOTE — Patient Instructions (Signed)
Please start diflucan. We will contact you with the results of your testing.

## 2015-10-26 NOTE — Progress Notes (Signed)
Pre visit review using our clinic review tool, if applicable. No additional management support is needed unless otherwise documented below in the visit note. 

## 2015-10-26 NOTE — Progress Notes (Signed)
   Subjective:    Patient ID: Joy [name], female    DOB: [birth year], 50 y.o.   MRN: [id number]  HPI  Joy [name] is a 50 yr old female who presents today with chief complaint of vaginal discharge. No itching, burning, odor, fever or pelvic pain. + clear vaginal discharge.  Was treated last month for trichomonas. Symptoms initially cleared. She has had no further contact with that partner.    Review of Systems See HPi  Past Medical History  Diagnosis Date  . Meningitis 2010    hospitalized  . Rheumatoid arthritis ([place]) 2015  . Osteoarthritis 2015    Social History   Social History  . Marital Status: Divorced    Spouse Name: Joy [name]  . Number of Children: 2  . Years of Education: N/A   Occupational History  . ORDER PROCESSOR    Social History Main Topics  . Smoking status: Never Smoker   . Smokeless tobacco: Never Used  . Alcohol Use: Yes     Comment: sometimes  . Drug Use: No  . Sexual Activity: Not on file   Other Topics Concern  . Not on file   Social History Narrative   Regular exercise:  No          Past Surgical History  Procedure Laterality Date  . Abdominal hysterectomy  1994    partial  . Tonsillectomy    . Breast surgery  2003    BIOPSY LEFT BREAST--BENIGN  . Foot surgery Right     Family History  Problem Relation Age of Onset  . Stroke Mother   . Blindness Mother     in left eye due to stroke  . Glaucoma Mother     caused blindness of right eye  . Cancer Maternal Aunt 60    ovarian  . Cancer Maternal Grandmother 60    colon  . Diabetes Maternal Aunt   . Diabetes Maternal Aunt     No Known Allergies  Current Outpatient Prescriptions on File Prior to Visit  Medication Sig Dispense Refill  . hydrocortisone cream 1 % Apply to affected area 2 times daily 15 g 0  . methotrexate (RHEUMATREX) 2.5 MG tablet Take 15 mg by mouth once a week. Caution:Chemotherapy. Protect from light.     No current facility-administered  medications on file prior to visit.    BP 132/80 mmHg  Pulse 71  Temp(Src) 98.3 F (36.8 C) (Oral)  Resp 16  Ht 5\' 5"  (1.651 m)  Wt 270 lb 12.8 oz (122.834 kg)  BMI 45.06 kg/m2  SpO2 98%       Objective:   Physical Exam  Constitutional: She is oriented to person, place, and time. She appears well-developed and well-nourished.  Genitourinary:  Thick white vaginal discharge.   Neurological: She is alert and oriented to person, place, and time.  Psychiatric: She has a normal mood and affect. Her behavior is normal. Judgment and thought content normal.          Assessment & Plan:  Vaginitis- suspect yeast. Will rx with diflucan. Wet Prep and GC/Chlamydia probe samples obtained.

## 2015-10-27 ENCOUNTER — Encounter: Payer: Self-pay | Admitting: Family

## 2015-10-27 LAB — CERVICOVAGINAL ANCILLARY ONLY
Chlamydia: NEGATIVE
NEISSERIA GONORRHEA: NEGATIVE
Wet Prep (BD Affirm): POSITIVE — AB

## 2015-11-29 LAB — HM MAMMOGRAPHY

## 2015-12-07 ENCOUNTER — Ambulatory Visit (INDEPENDENT_AMBULATORY_CARE_PROVIDER_SITE_OTHER): Payer: 59 | Admitting: Pediatrics

## 2015-12-07 ENCOUNTER — Encounter: Payer: Self-pay | Admitting: Pediatrics

## 2015-12-07 VITALS — BP 110/76 | HR 94 | Temp 98.4°F | Resp 16 | Ht 64.96 in | Wt 269.0 lb

## 2015-12-07 DIAGNOSIS — M069 Rheumatoid arthritis, unspecified: Secondary | ICD-10-CM | POA: Diagnosis not present

## 2015-12-07 DIAGNOSIS — J301 Allergic rhinitis due to pollen: Secondary | ICD-10-CM

## 2015-12-07 DIAGNOSIS — L259 Unspecified contact dermatitis, unspecified cause: Secondary | ICD-10-CM

## 2015-12-07 DIAGNOSIS — J4540 Moderate persistent asthma, uncomplicated: Secondary | ICD-10-CM | POA: Diagnosis not present

## 2015-12-07 MED ORDER — BECLOMETHASONE DIPROPIONATE 40 MCG/ACT IN AERS: INHALATION_SPRAY | RESPIRATORY_TRACT | Status: DC

## 2015-12-07 MED ORDER — ALBUTEROL SULFATE HFA 108 (90 BASE) MCG/ACT IN AERS: 2.0000 | INHALATION_SPRAY | RESPIRATORY_TRACT | Status: DC | PRN

## 2015-12-07 MED ORDER — LEVOCETIRIZINE DIHYDROCHLORIDE 5 MG PO TABS: ORAL_TABLET | ORAL | Status: DC

## 2015-12-07 NOTE — Progress Notes (Signed)
  [place] [id number] Dept: [phone]  FOLLOW UP NOTE  Patient ID: Joy [name], female    DOB: 1966-01-13  Age: 50 y.o. MRN: [id number] Date of Office Visit: 12/07/2015  Assessment Chief Complaint: Other  HPI Joy [name] presents for evaluation of an itchy rash that started yesterday around her mouth. She has had nasal congestion. She is allergic to grass pollens and tree pollens. She is scheduled to have knee surgery on May 15 because of loose cartilage. She has been diagnosed with rheumatoid arthritis  Current medications Qvar 40-2 puffs twice a day, Pro-air 2 puffs every 4 hours if needed, levocetirizine 5 mg once a day, methotrexate, adalimumab   40 mg. Her other medications are outlined in the chart.   Drug Allergies:  No Known Allergies  Physical Exam: BP 110/76 mmHg  Pulse 94  Temp(Src) 98.4 F (36.9 C) (Oral)  Resp 16  Ht 5' 4.96" (1.65 m)  Wt 268 lb 15.4 oz (122 kg)  BMI 44.81 kg/m2   Physical Exam  Constitutional: She is oriented to person, place, and time. She appears well-developed and well-nourished.  HENT:  Eyes normal. Ears normal. Nose moderate swelling of nasal turbinates. Pharynx normal.  Neck: Neck supple.  Cardiovascular:  S1 and S2 normal no murmurs  Pulmonary/Chest:  Clear to percussion auscultation  Lymphadenopathy:    She has no cervical adenopathy.  Neurological: She is alert and oriented to person, place, and time.  Skin:  She had a papular, itchy eruption around her mouth  Psychiatric: She has a normal mood and affect. Her behavior is normal. Thought content normal.  Vitals reviewed.   Diagnostics:  FVC 2.70 L FEV1 2.33 L. Predicted FVC 3.03 L predicted FEV1 2.43 L-the spirometry is in the normal range  Assessment and Plan: 1. Moderate persistent asthma, uncomplicated   2. Allergic rhinitis due to pollen   3. Dermatitis, contact   4. Rheumatoid arthritis, involving unspecified site, unspecified  rheumatoid factor presence ([place])     Meds ordered this encounter  Medications  . albuterol (PROAIR HFA) 108 (90 Base) MCG/ACT inhaler    Sig: Inhale 2 puffs into the lungs every 4 (four) hours as needed for wheezing or shortness of breath.    Dispense:  8 g    Refill:  2  . levocetirizine (XYZAL) 5 MG tablet    Sig: ONE TABLET ONCE A DAY FOR RUNNY NOSE OR ITCHING    Dispense:  30 tablet    Refill:  5  . beclomethasone (QVAR) 40 MCG/ACT inhaler    Sig: TWO PUFFS TWICE A DAY TO PREVENT COUGH OR WHEEZE. RINSE, GARGLE AND SPIT AFTER USE    Dispense:  1 Inhaler    Refill:  5    Patient Instructions  Continue on her current medications Add prednisone 20 mg twice a day for 3 days, 20 mg on the fourth day, 10 mg on the fifth day to bring the dermatitis under control Call me if you do not clear up on this treatment plan    Return in about 3 months (around 03/07/2016).    Thank you for the opportunity to care for this patient.  Please do not hesitate to contact me with questions.  Penne Lash, M.D.  [facility] [address] [place], [place] [address] [phone]

## 2015-12-07 NOTE — Patient Instructions (Signed)
Continue on her current medications Add prednisone 20 mg twice a day for 3 days, 20 mg on the fourth day, 10 mg on the fifth day to bring the dermatitis under control Call me if you do not clear up on this treatment plan

## 2015-12-08 ENCOUNTER — Ambulatory Visit: Payer: Self-pay | Admitting: Internal Medicine

## 2015-12-14 ENCOUNTER — Other Ambulatory Visit: Payer: Self-pay | Admitting: Allergy

## 2015-12-14 MED ORDER — BECLOMETHASONE DIPROPIONATE 40 MCG/ACT IN AERS: INHALATION_SPRAY | RESPIRATORY_TRACT | Status: DC

## 2015-12-16 ENCOUNTER — Other Ambulatory Visit: Payer: Self-pay | Admitting: Allergy

## 2015-12-16 ENCOUNTER — Telehealth: Payer: Self-pay | Admitting: Allergy

## 2015-12-16 MED ORDER — FLUTICASONE PROPIONATE HFA 44 MCG/ACT IN AERO: 2.0000 | INHALATION_SPRAY | Freq: Once | RESPIRATORY_TRACT | Status: DC

## 2015-12-16 NOTE — Telephone Encounter (Signed)
INSURANCE WOULD NOT PAY FOR Q-VAR.CHANGED TO FLOVENT 44MCG PER DR BHATTIL

## 2015-12-16 NOTE — Telephone Encounter (Signed)
informed patient we changed her qvar to flovent 72mcg.

## 2016-01-04 ENCOUNTER — Encounter: Payer: Self-pay | Admitting: Family

## 2016-01-21 ENCOUNTER — Encounter: Payer: Self-pay | Admitting: Family

## 2016-02-08 ENCOUNTER — Ambulatory Visit: Payer: 59 | Admitting: Pediatrics

## 2016-08-14 ENCOUNTER — Emergency Department (HOSPITAL_BASED_OUTPATIENT_CLINIC_OR_DEPARTMENT_OTHER)
Admission: EM | Admit: 2016-08-14 | Discharge: 2016-08-14 | Disposition: A | Discharge status: Home / Self Care | Payer: Self-pay | Attending: Emergency Medicine | Admitting: Emergency Medicine

## 2016-08-14 ENCOUNTER — Encounter (HOSPITAL_BASED_OUTPATIENT_CLINIC_OR_DEPARTMENT_OTHER): Payer: Self-pay | Admitting: *Deleted

## 2016-08-14 ENCOUNTER — Emergency Department (HOSPITAL_BASED_OUTPATIENT_CLINIC_OR_DEPARTMENT_OTHER): Payer: Self-pay

## 2016-08-14 DIAGNOSIS — R05 Cough: Secondary | ICD-10-CM | POA: Insufficient documentation

## 2016-08-14 DIAGNOSIS — R0789 Other chest pain: Secondary | ICD-10-CM | POA: Insufficient documentation

## 2016-08-14 DIAGNOSIS — R0981 Nasal congestion: Secondary | ICD-10-CM | POA: Insufficient documentation

## 2016-08-14 DIAGNOSIS — R059 Cough, unspecified: Secondary | ICD-10-CM

## 2016-08-14 DIAGNOSIS — R0602 Shortness of breath: Secondary | ICD-10-CM | POA: Insufficient documentation

## 2016-08-14 DIAGNOSIS — M25562 Pain in left knee: Secondary | ICD-10-CM | POA: Insufficient documentation

## 2016-08-14 DIAGNOSIS — M25561 Pain in right knee: Secondary | ICD-10-CM | POA: Insufficient documentation

## 2016-08-14 DIAGNOSIS — R062 Wheezing: Secondary | ICD-10-CM | POA: Insufficient documentation

## 2016-08-14 DIAGNOSIS — Z79899 Other long term (current) drug therapy: Secondary | ICD-10-CM | POA: Insufficient documentation

## 2016-08-14 LAB — CBC WITH DIFFERENTIAL/PLATELET
BASOS ABS: 0.1 10*3/uL (ref 0.0–0.1)
BASOS PCT: 1 %
Eosinophils Absolute: 0.2 10*3/uL (ref 0.0–0.7)
Eosinophils Relative: 2 %
HEMATOCRIT: 34.1 % — AB (ref 36.0–46.0)
HEMOGLOBIN: 10.8 g/dL — AB (ref 12.0–15.0)
LYMPHS PCT: 32 %
Lymphs Abs: 2.9 10*3/uL (ref 0.7–4.0)
MCH: 30 pg (ref 26.0–34.0)
MCHC: 31.7 g/dL (ref 30.0–36.0)
MCV: 94.7 fL (ref 78.0–100.0)
MONO ABS: 0.6 10*3/uL (ref 0.1–1.0)
Monocytes Relative: 7 %
NEUTROS ABS: 5.2 10*3/uL (ref 1.7–7.7)
NEUTROS PCT: 58 %
Platelets: 316 10*3/uL (ref 150–400)
RBC: 3.6 MIL/uL — AB (ref 3.87–5.11)
RDW: 12.5 % (ref 11.5–15.5)
WBC: 8.9 10*3/uL (ref 4.0–10.5)

## 2016-08-14 LAB — BASIC METABOLIC PANEL
ANION GAP: 7 (ref 5–15)
BUN: 10 mg/dL (ref 6–20)
CALCIUM: 8.9 mg/dL (ref 8.9–10.3)
CO2: 30 mmol/L (ref 22–32)
Chloride: 102 mmol/L (ref 101–111)
Creatinine, Ser: 1.1 mg/dL — ABNORMAL HIGH (ref 0.44–1.00)
GFR, EST NON AFRICAN AMERICAN: 58 mL/min — AB (ref >60)
GLUCOSE: 68 mg/dL (ref 65–99)
POTASSIUM: 3.4 mmol/L — AB (ref 3.5–5.1)
Sodium: 139 mmol/L (ref 135–145)

## 2016-08-14 LAB — D-DIMER, QUANTITATIVE (NOT AT ARMC)

## 2016-08-14 LAB — TROPONIN I: Troponin I: <0.03 ng/mL (ref <0.03)

## 2016-08-14 MED ORDER — BENZONATATE 100 MG PO CAPS: 100.0000 mg | ORAL_CAPSULE | Freq: Three times a day (TID) | ORAL | 0 refills | Status: DC | PRN

## 2016-08-14 MED ORDER — ALBUTEROL SULFATE (2.5 MG/3ML) 0.083% IN NEBU
5.0000 mg | INHALATION_SOLUTION | Freq: Once | RESPIRATORY_TRACT | Status: AC
  Administered 2016-08-14: 5 mg via RESPIRATORY_TRACT
  Filled 2016-08-14: qty 6

## 2016-08-14 NOTE — ED Triage Notes (Signed)
Pt c/o URi symptoms x 1 week and SOB x 2 days

## 2016-08-14 NOTE — ED Notes (Signed)
Pt not in room for ekg.

## 2016-08-14 NOTE — Discharge Instructions (Signed)
Read the information below.  Your chest x-ray was re-assuring - no signs of pneumonia. You may have bronchitis.  Your potassium was slightly low - eat bananas, squash, and sweet potatoes. Your kidney function was slightly elevated - be sure to drink plenty of fluids.  Continue your inhaler as prescribed and as needed for relief.  I have prescribed tessalon perles for cough relief. You can also try honey, warm liquids, and cool mist humidifier.  Please follow up with your primary provider in 2-3 days for re-evaluation.  Use the prescribed medication as directed.  Please discuss all new medications with your pharmacist.   You may return to the Emergency Department at any time for worsening condition or any new symptoms that concern you. Return to ED if you develop fever, worsening symptoms, or any new/concerning symptoms.

## 2016-08-14 NOTE — ED Provider Notes (Signed)
[facility] DEPT Provider Note   CSN: [id number] Arrival date & time: 08/14/16  1536  By signing my name below, I, Joy [name], attest that this documentation has been prepared under the direction and in the presence of Gay Filler, PA-C. Electronically Signed: Dora [name], Scribe. 08/14/2016. 4:40 PM.  History   Chief Complaint Chief Complaint  Patient presents with  . URI    The history is provided by the patient. No language interpreter was used.     HPI Comments: Joy [name] is a 51 y.o. female who presents to the Emergency Department complaining of a persistent dry cough for about one week. She reports associated nasal congestion, wheezing, chest tightness, and SOB. Pt notes she has had wheezing, chest tightness, and SOB today but is not experiencing any of these symptoms currently. She reports she used her inhaler x2 PTA today with some relief of her chest tightness/wheezing/SOB. She has also tried Mucinex with minimal improvement of her cough and congestion. She denies h/o asthma but the medical record indicates she has moderate, persistent asthma. No h/o COPD, HTN, HLD, or DM. No personal h/o heart problems, cancer, or blood clot. No recent immobilizations, surgeries, or hospitalizations. She does not use hormone therapy or oral contraceptives. No known sick contacts with similar symptoms. She is not a smoker. Pt has bilateral knee pain at baseline due to arthritis but denies acute change in this condition. She denies CP, nausea, vomiting, syncope, lightheadedness, hemoptysis, acute leg swelling/pain, fever, chills, diaphoresis, rhinorrhea, sinus pressure, sinus pain, ear pain, neck pain, sore throat, or any other associated symptoms.  Past Medical History:  Diagnosis Date  . Meningitis 2010   hospitalized  . Osteoarthritis 2015  . Rheumatoid arthritis ([place]) 2015    Patient Active Problem List   Diagnosis Date Noted  . Moderate persistent asthma 12/07/2015  .  Dermatitis, contact 12/07/2015  . Allergic rhinitis due to pollen 12/07/2015  . Rheumatoid arthritis ([place]) 12/07/2015  . Vaginal discharge 09/29/2015  . Folate deficiency 11/20/2014  . Fatty liver 11/20/2014  . Routine general medical examination at a health care facility 09/26/2013  . Joint pain 09/26/2013  . ANEMIA, PERNICIOUS 07/05/2010  . OBESITY 07/01/2010    Past Surgical History:  Procedure Laterality Date  . ABDOMINAL HYSTERECTOMY  1994   partial  . BREAST SURGERY  2003   BIOPSY LEFT BREAST--BENIGN  . FOOT SURGERY Right   . TONSILLECTOMY      OB History    No data available       Home Medications    Prior to Admission medications   Medication Sig Start Date End Date Taking? Authorizing Provider  Adalimumab 40 MG/0.8ML PNKT Inject 40 mg into the skin. 11/23/15   Historical Provider, MD  albuterol (PROAIR HFA) 108 (90 Base) MCG/ACT inhaler Inhale 2 puffs into the lungs every 4 (four) hours as needed for wheezing or shortness of breath. 12/07/15   Charlies Silvers, MD  beclomethasone (QVAR) 40 MCG/ACT inhaler TWO PUFFS TWICE A DAY TO PREVENT COUGH OR WHEEZE. RINSE, GARGLE AND SPIT AFTER USE 12/14/15   Charlies Silvers, MD  benzonatate (TESSALON PERLES) 100 MG capsule Take 1 capsule (100 mg total) by mouth 3 (three) times daily as needed for cough. 08/14/16   Roxanna Mew, PA-C  fluticasone (FLOVENT HFA) 44 MCG/ACT inhaler Inhale 2 puffs into the lungs once. 12/16/15   Leda Roys, MD  folic acid (FOLVITE) 1 MG tablet TK 1 T PO D 11/11/15   Historical  Provider, MD  levocetirizine (XYZAL) 5 MG tablet ONE TABLET ONCE A DAY FOR RUNNY NOSE OR ITCHING 12/07/15   Charlies Silvers, MD  methotrexate (RHEUMATREX) 2.5 MG tablet Take 15 mg by mouth once a week. Caution:Chemotherapy. Protect from light.    Historical Provider, MD  nitrofurantoin (MACRODANTIN) 100 MG capsule Take 100 mg by mouth. Reported on 12/07/2015    Historical Provider, MD  Polyethyl Glycol-Propyl Glycol (SYSTANE OP)  Apply to eye daily. Instills one drop in each eye daily    Historical Provider, MD    Family History Family History  Problem Relation Age of Onset  . Stroke Mother   . Blindness Mother     in left eye due to stroke  . Glaucoma Mother     caused blindness of right eye  . Cancer Maternal Aunt 60    ovarian  . Cancer Maternal Grandmother 60    colon  . Diabetes Maternal Aunt   . Diabetes Maternal Aunt     Social History Social History  Substance Use Topics  . Smoking status: Never Smoker  . Smokeless tobacco: Never Used  . Alcohol use Yes     Comment: sometimes     Allergies   Patient has no known allergies.   Review of Systems Review of Systems  Constitutional: Negative for chills, diaphoresis and fever.  HENT: Positive for congestion. Negative for ear pain, rhinorrhea, sinus pain, sinus pressure and sore throat.   Respiratory: Positive for cough (no hemoptysis), chest tightness, shortness of breath and wheezing.   Cardiovascular: Negative for chest pain and leg swelling.  Gastrointestinal: Negative for nausea and vomiting.  Musculoskeletal: Positive for arthralgias (bilateral knees at baseline, no changes). Negative for myalgias and neck pain.  Neurological: Negative for syncope and light-headedness.  All other systems reviewed and are negative.    Physical Exam Updated Vital Signs BP 127/76   Pulse 86   Temp 98.1 F (36.7 C) (Oral)   Resp 15   Ht 5\' 5"  (1.651 m)   Wt 122.5 kg   SpO2 100%   BMI 44.93 kg/m   Physical Exam  Constitutional: She appears well-developed and well-nourished. No distress.  HENT:  Head: Normocephalic and atraumatic.  Right Ear: Tympanic membrane, external ear and ear canal normal.  Left Ear: Tympanic membrane, external ear and ear canal normal.  Nose: Mucosal edema ( right) present.  Mouth/Throat: Uvula is midline, oropharynx is clear and moist and mucous membranes are normal. No trismus in the jaw. No tonsillar exudate.  Mild  swelling to the right nasal turbinates. No trismus. Uvula midline and rises symmetrically.   Eyes: Conjunctivae and EOM are normal. Pupils are equal, round, and reactive to light. Right eye exhibits no discharge. Left eye exhibits no discharge. No scleral icterus.  Neck: Normal range of motion. No neck rigidity. Normal range of motion present.  No nuchal rigidity.   Cardiovascular: Normal rate, regular rhythm, normal heart sounds and intact distal pulses.   No murmur heard. Pulmonary/Chest: Effort normal and breath sounds normal. No stridor. No respiratory distress. She has no wheezes. She has no rales.  Rhonchi in the lung bases b/l.  Abdominal: Soft. There is no tenderness.  Musculoskeletal: Normal range of motion. She exhibits no edema or tenderness.  No lower extremity swelling. No TTP of posterior calves b/l.   Lymphadenopathy:    She has no cervical adenopathy.  Neurological: She is alert.  Skin: Skin is warm and dry. She is not diaphoretic.  Psychiatric:  She has a normal mood and affect. Her behavior is normal.     ED Treatments / Results  Labs (all labs ordered are listed, but only abnormal results are displayed) Labs Reviewed  BASIC METABOLIC PANEL - Abnormal; Notable for the following:       Result Value   Potassium 3.4 (*)    Creatinine, Ser 1.10 (*)    GFR calc non Af Amer 58 (*)    All other components within normal limits  CBC WITH DIFFERENTIAL/PLATELET - Abnormal; Notable for the following:    RBC 3.60 (*)    Hemoglobin 10.8 (*)    HCT 34.1 (*)    All other components within normal limits  TROPONIN I  D-DIMER, QUANTITATIVE (NOT AT [facility])  TROPONIN I    EKG  EKG Interpretation  Date/Time:  Monday August 14 2016 17:00:39 EST Ventricular Rate:  81 PR Interval:    QRS Duration: 83 QT Interval:  402 QTC Calculation: 467 R Axis:   10 Text Interpretation:  Sinus rhythm Borderline T abnormalities, anterior leads Confirmed by RAY MD, Andee Poles [id number]) on 08/15/2016  5:32:18 PM       Radiology No results found.  Procedures Procedures (including critical care time)  DIAGNOSTIC STUDIES: Oxygen Saturation is 100% on RA, normal by my interpretation.    COORDINATION OF CARE: 4:47 PM Discussed treatment plan with pt at bedside and pt agreed to plan.  Medications Ordered in ED Medications  albuterol (PROVENTIL) (2.5 MG/3ML) 0.083% nebulizer solution 5 mg (5 mg Nebulization Given 08/14/16 1706)     Initial Impression / Assessment and Plan / ED Course  I have reviewed the triage vital signs and the nursing notes.  Pertinent labs & imaging results that were available during my care of the patient were reviewed by me and considered in my medical decision making (see chart for details).  Clinical Course as of Aug 17 698  Mon Aug 14, 2016  1722 DG Chest 2 View [AM]  2044 Patient endorses improvement in breathing following breathing treatment. Respirations unlabored. No hypoxia. Lungs are clear to auscultation bilaterally.  [RT]    Clinical Course User Index [AM] Roxanna Mew, PA-C [RT] Joy [name]    Patient presents to ED with complaint of cough, wheezing, SOB, and chest tightness. Patient is afebrile and non-toxic appearing in NAD. VSS. Heart RRR. Lungs CTABL. Respirations are unlabored. No hypoxia. Given complaint of chest tightness and SOB will check basic las, EKG, and CXR. Will given breathing treatment.   On re-evaluation patient endorses improvement in chest tightness and breathing following treatment. Lungs remains CTABL. Mile elevation in creatinine - ?secondary to recent illness, tolerating PO will encourage fluids. Slightly low potassium - encouraged foods rich in potassium. No leukocytosis. Anemia; however, relatively stable. D-dimer negative - low suspicion for PE at this time. EKG shows sinus rhythm with borderline t-waves. Delta trop negative. Low suspicion for ACS at this time. CXR shows no evidence of acute infiltrate,  pleural effusion, or PTX. Suspect sxs may be ?bronchitis vs. ?mild asthma exacerbation.  Discussed results and plan with patient. Discussed symptomatic treatment. Encouraged use of inhaler as needed. Rx tessalon perles for cough relief. Follow up with PCP within next week for re-evaluation. Return precautions given. Pt voiced understanding and is agreeable.   Final Clinical Impressions(s) / ED Diagnoses   Final diagnoses:  Cough    New Prescriptions Discharge Medication List as of 08/14/2016  8:41 PM    START taking these medications  Details  benzonatate (TESSALON PERLES) 100 MG capsule Take 1 capsule (100 mg total) by mouth 3 (three) times daily as needed for cough., Starting Mon 08/14/2016, Print       I personally performed the services described in this documentation, which was scribed in my presence. The recorded information has been reviewed and is accurate.    Roxanna Mew, PA-C 08/17/16 0700    Forde Dandy, MD 08/17/16 [phone]

## 2016-09-15 DIAGNOSIS — Z79899 Other long term (current) drug therapy: Secondary | ICD-10-CM | POA: Insufficient documentation

## 2016-09-15 DIAGNOSIS — L03115 Cellulitis of right lower limb: Secondary | ICD-10-CM | POA: Insufficient documentation

## 2016-09-16 ENCOUNTER — Encounter (HOSPITAL_BASED_OUTPATIENT_CLINIC_OR_DEPARTMENT_OTHER): Payer: Self-pay | Admitting: *Deleted

## 2016-09-16 ENCOUNTER — Emergency Department (HOSPITAL_BASED_OUTPATIENT_CLINIC_OR_DEPARTMENT_OTHER)
Admission: EM | Admit: 2016-09-16 | Discharge: 2016-09-16 | Disposition: A | Discharge status: Home / Self Care | Payer: 59 | Attending: Emergency Medicine | Admitting: Emergency Medicine

## 2016-09-16 DIAGNOSIS — L03115 Cellulitis of right lower limb: Secondary | ICD-10-CM

## 2016-09-16 MED ORDER — CLINDAMYCIN HCL 150 MG PO CAPS
450.0000 mg | ORAL_CAPSULE | Freq: Once | ORAL | Status: AC
  Administered 2016-09-16: 450 mg via ORAL
  Filled 2016-09-16: qty 3

## 2016-09-16 MED ORDER — CEPHALEXIN 250 MG PO CAPS
500.0000 mg | ORAL_CAPSULE | Freq: Once | ORAL | Status: DC
  Filled 2016-09-16: qty 2

## 2016-09-16 MED ORDER — SULFAMETHOXAZOLE-TRIMETHOPRIM 800-160 MG PO TABS
1.0000 | ORAL_TABLET | Freq: Once | ORAL | Status: DC
  Filled 2016-09-16: qty 1

## 2016-09-16 MED ORDER — CLINDAMYCIN HCL 150 MG PO CAPS: 450.0000 mg | ORAL_CAPSULE | Freq: Three times a day (TID) | ORAL | 0 refills | Status: AC

## 2016-09-16 NOTE — ED Provider Notes (Signed)
[facility] DEPT [facility] Provider Note   CSN: [id number] Arrival date & time: 09/15/16  2359     History   Chief Complaint Chief Complaint  Patient presents with  . Abscess    HPI ARVA Joy [name] is a 51 y.o. female.  HPI  51 year old female presents with right lower leg redness and swelling. She is concern for an abscess. She has had one lanced before. Started 3 days ago, next day she tried to pop it with a needle but no pus came out. Since then the swelling and redness is slightly worse. No drainage. The site is warm but otherwise no fevers. Has pain with any type of movement. Has not taken anything for the pain. Denies any history of diabetes or immunocompromise. No wounds, injuries or bites  Past Medical History:  Diagnosis Date  . Meningitis 2010   hospitalized  . Osteoarthritis 2015  . Rheumatoid arthritis ([place]) 2015    Patient Active Problem List   Diagnosis Date Noted  . Moderate persistent asthma 12/07/2015  . Dermatitis, contact 12/07/2015  . Allergic rhinitis due to pollen 12/07/2015  . Rheumatoid arthritis ([place]) 12/07/2015  . Vaginal discharge 09/29/2015  . Folate deficiency 11/20/2014  . Fatty liver 11/20/2014  . Routine general medical examination at a health care facility 09/26/2013  . Joint pain 09/26/2013  . ANEMIA, PERNICIOUS 07/05/2010  . OBESITY 07/01/2010    Past Surgical History:  Procedure Laterality Date  . ABDOMINAL HYSTERECTOMY  1994   partial  . BREAST SURGERY  2003   BIOPSY LEFT BREAST--BENIGN  . FOOT SURGERY Right   . TONSILLECTOMY      OB History    Gravida Para Term Preterm AB Living   0 0 0 0 0 0   SAB TAB Ectopic Multiple Live Births   0 0 0 0 0       Home Medications    Prior to Admission medications   Medication Sig Start Date End Date Taking? Authorizing Provider  Adalimumab 40 MG/0.8ML PNKT Inject 40 mg into the skin. 11/23/15   Historical Provider, MD  albuterol (PROAIR HFA) 108 (90 Base) MCG/ACT inhaler  Inhale 2 puffs into the lungs every 4 (four) hours as needed for wheezing or shortness of breath. 12/07/15   Charlies Silvers, MD  beclomethasone (QVAR) 40 MCG/ACT inhaler TWO PUFFS TWICE A DAY TO PREVENT COUGH OR WHEEZE. RINSE, GARGLE AND SPIT AFTER USE 12/14/15   Charlies Silvers, MD  benzonatate (TESSALON PERLES) 100 MG capsule Take 1 capsule (100 mg total) by mouth 3 (three) times daily as needed for cough. 08/14/16   Roxanna Mew, PA-C  fluticasone (FLOVENT HFA) 44 MCG/ACT inhaler Inhale 2 puffs into the lungs once. 12/16/15   Leda Roys, MD  folic acid (FOLVITE) 1 MG tablet TK 1 T PO D 11/11/15   Historical Provider, MD  levocetirizine (XYZAL) 5 MG tablet ONE TABLET ONCE A DAY FOR RUNNY NOSE OR ITCHING 12/07/15   Charlies Silvers, MD  methotrexate (RHEUMATREX) 2.5 MG tablet Take 15 mg by mouth once a week. Caution:Chemotherapy. Protect from light.    Historical Provider, MD  nitrofurantoin (MACRODANTIN) 100 MG capsule Take 100 mg by mouth. Reported on 12/07/2015    Historical Provider, MD  Polyethyl Glycol-Propyl Glycol (SYSTANE OP) Apply to eye daily. Instills one drop in each eye daily    Historical Provider, MD    Family History Family History  Problem Relation Age of Onset  . Stroke Mother   .  Blindness Mother     in left eye due to stroke  . Glaucoma Mother     caused blindness of right eye  . Cancer Maternal Aunt 60    ovarian  . Cancer Maternal Grandmother 60    colon  . Diabetes Maternal Aunt   . Diabetes Maternal Aunt     Social History Social History  Substance Use Topics  . Smoking status: Never Smoker  . Smokeless tobacco: Never Used  . Alcohol use Yes     Comment: sometimes     Allergies   Patient has no known allergies.   Review of Systems Review of Systems  Constitutional: Negative for fever.  Skin: Positive for color change.  All other systems reviewed and are negative.    Physical Exam Updated Vital Signs BP 140/77 (BP Location: Right Arm)    Pulse 88   Temp 98.2 F (36.8 C) (Oral)   Resp 19   Ht 5\' 5"  (1.651 m)   Wt 270 lb (122.5 kg)   SpO2 99%   BMI 44.93 kg/m   Physical Exam  Constitutional: She is oriented to person, place, and time. She appears well-developed and well-nourished.  HENT:  Head: Normocephalic and atraumatic.  Right Ear: External ear normal.  Left Ear: External ear normal.  Nose: Nose normal.  Eyes: Right eye exhibits no discharge. Left eye exhibits no discharge.  Pulmonary/Chest: Effort normal.  Abdominal: She exhibits no distension.  Musculoskeletal:       Legs: Neurological: She is alert and oriented to person, place, and time.  Skin: Skin is warm and dry. There is erythema.  Nursing note and vitals reviewed.    ED Treatments / Results  Labs (all labs ordered are listed, but only abnormal results are displayed) Labs Reviewed - No data to display  EKG  EKG Interpretation None       Radiology No results found.  Procedures Procedures (including critical care time)  ULTRASOUND LIMITED SOFT TISSUE/ MUSCULOSKELETAL: RIGHT LOWER LEG Indication: rule out abscess Linear probe used to evaluate area of interest in two planes. Findings:  Small non loculate fluid but mostly cellulitis Performed by: Dr Regenia Skeeter Images saved electronically Medications Ordered in ED Medications  cephALEXin (KEFLEX) capsule 500 mg (not administered)  sulfamethoxazole-trimethoprim (BACTRIM DS,SEPTRA DS) 800-160 MG per tablet 1 tablet (not administered)     Initial Impression / Assessment and Plan / ED Course  I have reviewed the triage vital signs and the nursing notes.  Pertinent labs & imaging results that were available during my care of the patient were reviewed by me and considered in my medical decision making (see chart for details).     Patient's exam shows localized erythema consistent with cellulitis. There is a small scab that raise concern for possible abscess but there is no significant  fluid collection on ultrasound. It is not raised or fluctuant. Patient will be treated with clindamycin (would do keflex/bactrim but could cause reaction with methotrexate). I discussed wound care precautions as well as strict return precautions. Follow-up with PCP for wound check.  Final Clinical Impressions(s) / ED Diagnoses   Final diagnoses:  Cellulitis of right lower leg    New Prescriptions New Prescriptions   CLINDAMYCIN (CLEOCIN) 150 MG CAPSULE    Take 3 capsules (450 mg total) by mouth 3 (three) times daily. X 7 days     Sherwood Gambler, MD 09/16/16 [phone]

## 2016-09-16 NOTE — ED Triage Notes (Signed)
Pt co abscess to right lower leg x 2 days

## 2016-11-15 ENCOUNTER — Encounter (HOSPITAL_BASED_OUTPATIENT_CLINIC_OR_DEPARTMENT_OTHER): Payer: Self-pay

## 2016-11-15 ENCOUNTER — Emergency Department (HOSPITAL_BASED_OUTPATIENT_CLINIC_OR_DEPARTMENT_OTHER)
Admission: EM | Admit: 2016-11-15 | Discharge: 2016-11-15 | Disposition: A | Discharge status: Home / Self Care | Payer: 59 | Attending: Emergency Medicine | Admitting: Emergency Medicine

## 2016-11-15 DIAGNOSIS — J4540 Moderate persistent asthma, uncomplicated: Secondary | ICD-10-CM | POA: Insufficient documentation

## 2016-11-15 DIAGNOSIS — J069 Acute upper respiratory infection, unspecified: Secondary | ICD-10-CM

## 2016-11-15 DIAGNOSIS — Z79899 Other long term (current) drug therapy: Secondary | ICD-10-CM | POA: Insufficient documentation

## 2016-11-15 MED ORDER — ALBUTEROL SULFATE HFA 108 (90 BASE) MCG/ACT IN AERS
1.0000 | INHALATION_SPRAY | RESPIRATORY_TRACT | Status: DC | PRN
  Administered 2016-11-15: 2 via RESPIRATORY_TRACT
  Filled 2016-11-15: qty 6.7

## 2016-11-15 MED ORDER — CETIRIZINE HCL 10 MG PO TABS: 10.0000 mg | ORAL_TABLET | Freq: Every day | ORAL | 0 refills | Status: AC

## 2016-11-15 MED ORDER — OXYMETAZOLINE HCL 0.05 % NA SOLN: 1.0000 | Freq: Two times a day (BID) | NASAL | 0 refills | Status: AC

## 2016-11-15 MED ORDER — BENZONATATE 100 MG PO CAPS: 100.0000 mg | ORAL_CAPSULE | Freq: Three times a day (TID) | ORAL | 0 refills | Status: DC

## 2016-11-15 MED ORDER — FLUTICASONE PROPIONATE 50 MCG/ACT NA SUSP: 2.0000 | Freq: Every day | NASAL | 0 refills | Status: AC

## 2016-11-15 MED FILL — BENZONATATE 100 MG CAP: 100 | 7 days supply | Qty: 21 | Fill #0

## 2016-11-15 MED FILL — NASAL DECONGESTANT 0.05% SP: 0.05 | 30 days supply | Qty: 15 | Fill #0

## 2016-11-15 MED FILL — FLUTICASONE PROP 50 MCG SPR: 50 | 30 days supply | Qty: 16 | Fill #0

## 2016-11-15 MED FILL — ALL DAY ALLERGY 10 MG TAB: 10 | 100 days supply | Qty: 100 | Fill #0

## 2016-11-15 NOTE — ED Triage Notes (Signed)
c/o "losing voice", scratchy throat, difficulty breathing at night x 3 days-pt reports hx of allergies-not currently taking zyrtec-NAD-steady gait

## 2016-11-15 NOTE — ED Provider Notes (Signed)
[facility] DEPT [facility] Provider Note   CSN: [id number] Arrival date & time: 11/15/16  1445     History   Chief Complaint Chief Complaint  Patient presents with  . Allergies    HPI Joy [name] is a 51 y.o. female.  HPI Patient states she's had several days of nasal congestion, sinus pressure, sore throat, loss of voice and nonproductive cough. States she has some wheezing mostly at night. Denies any shortness of breath. She's had some subjective fevers and chills but denies any currently. Denies neck pain or stiffness. Denies chest pain. Past Medical History:  Diagnosis Date  . Meningitis 2010   hospitalized  . Osteoarthritis 2015  . Rheumatoid arthritis ([place]) 2015    Patient Active Problem List   Diagnosis Date Noted  . Moderate persistent asthma 12/07/2015  . Dermatitis, contact 12/07/2015  . Allergic rhinitis due to pollen 12/07/2015  . Rheumatoid arthritis ([place]) 12/07/2015  . Vaginal discharge 09/29/2015  . Folate deficiency 11/20/2014  . Fatty liver 11/20/2014  . Routine general medical examination at a health care facility 09/26/2013  . Joint pain 09/26/2013  . ANEMIA, PERNICIOUS 07/05/2010  . OBESITY 07/01/2010    Past Surgical History:  Procedure Laterality Date  . ABDOMINAL HYSTERECTOMY  1994   partial  . BREAST SURGERY  2003   BIOPSY LEFT BREAST--BENIGN  . FOOT SURGERY Right   . TONSILLECTOMY      OB History    Gravida Para Term Preterm AB Living   0 0 0 0 0 0   SAB TAB Ectopic Multiple Live Births   0 0 0 0 0       Home Medications    Prior to Admission medications   Medication Sig Start Date End Date Taking? Authorizing Provider  albuterol (PROAIR HFA) 108 (90 Base) MCG/ACT inhaler Inhale 2 puffs into the lungs every 4 (four) hours as needed for wheezing or shortness of breath. 12/07/15   Charlies Silvers, MD  beclomethasone (QVAR) 40 MCG/ACT inhaler TWO PUFFS TWICE A DAY TO PREVENT COUGH OR WHEEZE. RINSE, GARGLE AND SPIT AFTER USE  12/14/15   Charlies Silvers, MD  benzonatate (TESSALON) 100 MG capsule Take 1 capsule (100 mg total) by mouth every 8 (eight) hours. 11/15/16   Julianne Rice, MD  cetirizine (ZYRTEC ALLERGY) 10 MG tablet Take 1 tablet (10 mg total) by mouth daily. 11/15/16   Julianne Rice, MD  fluticasone (FLONASE) 50 MCG/ACT nasal spray Place 2 sprays into both nostrils daily. 11/15/16   Julianne Rice, MD  fluticasone (FLOVENT HFA) 44 MCG/ACT inhaler Inhale 2 puffs into the lungs once. 12/16/15   Leda Roys, MD  folic acid (FOLVITE) 1 MG tablet TK 1 T PO D 11/11/15   Historical Provider, MD  methotrexate (RHEUMATREX) 2.5 MG tablet Take 15 mg by mouth once a week. Caution:Chemotherapy. Protect from light.    Historical Provider, MD  oxymetazoline (AFRIN NASAL SPRAY) 0.05 % nasal spray Place 1 spray into both nostrils 2 (two) times daily. 11/15/16 11/18/16  Julianne Rice, MD    Family History Family History  Problem Relation Age of Onset  . Stroke Mother   . Blindness Mother     in left eye due to stroke  . Glaucoma Mother     caused blindness of right eye  . Cancer Maternal Aunt 60    ovarian  . Cancer Maternal Grandmother 60    colon  . Diabetes Maternal Aunt   . Diabetes Maternal Aunt  Social History Social History  Substance Use Topics  . Smoking status: Never Smoker  . Smokeless tobacco: Never Used  . Alcohol use No     Allergies   Patient has no known allergies.   Review of Systems Review of Systems  Constitutional: Positive for chills and fatigue.  HENT: Positive for congestion, postnasal drip, rhinorrhea, sinus pain, sinus pressure, sore throat and voice change. Negative for facial swelling and trouble swallowing.   Respiratory: Positive for cough and wheezing. Negative for shortness of breath.   Cardiovascular: Negative for chest pain, palpitations and leg swelling.  Gastrointestinal: Negative for abdominal pain and nausea.  Musculoskeletal: Negative for back pain and neck pain.    Skin: Negative for rash and wound.  Neurological: Negative for dizziness, weakness, light-headedness, numbness and headaches.  All other systems reviewed and are negative.    Physical Exam Updated Vital Signs BP 127/79 (BP Location: Right Arm)   Pulse 100   Temp 98.7 F (37.1 C) (Oral)   Resp 18   Ht 5\' 5"  (1.651 m)   Wt 270 lb (122.5 kg)   SpO2 97%   BMI 44.93 kg/m   Physical Exam  Constitutional: She is oriented to person, place, and time. She appears well-developed and well-nourished. No distress.  HENT:  Head: Normocephalic and atraumatic.  Mouth/Throat: Oropharynx is clear and moist.  Bilateral nasal mucosal edema. Oropharynx is erythematous but no tonsillar exudates. Uvula is midline. Patient has mild tenderness to percussion over the right maxillary sinus  Eyes: Conjunctivae and EOM are normal. Pupils are equal, round, and reactive to light. Right eye exhibits no discharge. Left eye exhibits no discharge.  Neck: Normal range of motion. Neck supple.  No stridor appreciated. No difficulty breathing.  Cardiovascular: Normal rate and regular rhythm.   Pulmonary/Chest: Effort normal and breath sounds normal. No stridor. No respiratory distress. She has no wheezes. She has no rales. She exhibits no tenderness.  Abdominal: Soft. Bowel sounds are normal. There is no tenderness. There is no rebound and no guarding.  Musculoskeletal: Normal range of motion. She exhibits no edema or tenderness.  The lower extremity swelling or asymmetry.  Lymphadenopathy:    She has no cervical adenopathy.  Neurological: She is alert and oriented to person, place, and time.  Skin: Skin is warm and dry. No rash noted. No erythema.  Psychiatric: She has a normal mood and affect. Her behavior is normal.  Nursing note and vitals reviewed.    ED Treatments / Results  Labs (all labs ordered are listed, but only abnormal results are displayed) Labs Reviewed - No data to display  EKG  EKG  Interpretation None       Radiology No results found.  Procedures Procedures (including critical care time)  Medications Ordered in ED Medications  albuterol (PROVENTIL HFA;VENTOLIN HFA) 108 (90 Base) MCG/ACT inhaler 1-2 puff (not administered)     Initial Impression / Assessment and Plan / ED Course  I have reviewed the triage vital signs and the nursing notes.  Pertinent labs & imaging results that were available during my care of the patient were reviewed by me and considered in my medical decision making (see chart for details).     Patient with evidence of URI like a complicated by her seasonal allergies. Low suspicion for strep throat or pneumonia. Patient has no airway compromise. Return precautions given.  Final Clinical Impressions(s) / ED Diagnoses   Final diagnoses:  Upper respiratory tract infection, unspecified type    New  Prescriptions New Prescriptions   BENZONATATE (TESSALON) 100 MG CAPSULE    Take 1 capsule (100 mg total) by mouth every 8 (eight) hours.   CETIRIZINE (ZYRTEC ALLERGY) 10 MG TABLET    Take 1 tablet (10 mg total) by mouth daily.   FLUTICASONE (FLONASE) 50 MCG/ACT NASAL SPRAY    Place 2 sprays into both nostrils daily.   OXYMETAZOLINE (AFRIN NASAL SPRAY) 0.05 % NASAL SPRAY    Place 1 spray into both nostrils 2 (two) times daily.     Julianne Rice, MD 11/15/16 [phone]

## 2017-02-21 ENCOUNTER — Encounter: Payer: Self-pay | Admitting: Family

## 2017-02-21 DIAGNOSIS — Z0289 Encounter for other administrative examinations: Secondary | ICD-10-CM

## 2017-08-22 ENCOUNTER — Telehealth: Payer: Self-pay | Admitting: *Deleted

## 2017-08-22 NOTE — Telephone Encounter (Signed)
Received request for Medical records from [place], forwarded to [place] for email/scan/SLS 01/09

## 2019-02-04 ENCOUNTER — Other Ambulatory Visit: Payer: Self-pay

## 2019-02-04 ENCOUNTER — Emergency Department (HOSPITAL_BASED_OUTPATIENT_CLINIC_OR_DEPARTMENT_OTHER)
Admission: EM | Admit: 2019-02-04 | Discharge: 2019-02-04 | Disposition: A | Discharge status: Home / Self Care | Payer: Self-pay | Attending: Emergency Medicine | Admitting: Emergency Medicine

## 2019-02-04 ENCOUNTER — Encounter (HOSPITAL_BASED_OUTPATIENT_CLINIC_OR_DEPARTMENT_OTHER): Payer: Self-pay | Admitting: Adult Health

## 2019-02-04 ENCOUNTER — Emergency Department (HOSPITAL_BASED_OUTPATIENT_CLINIC_OR_DEPARTMENT_OTHER): Payer: Self-pay

## 2019-02-04 DIAGNOSIS — J40 Bronchitis, not specified as acute or chronic: Secondary | ICD-10-CM | POA: Insufficient documentation

## 2019-02-04 DIAGNOSIS — J209 Acute bronchitis, unspecified: Secondary | ICD-10-CM

## 2019-02-04 DIAGNOSIS — Z79899 Other long term (current) drug therapy: Secondary | ICD-10-CM | POA: Insufficient documentation

## 2019-02-04 MED ORDER — BENZONATATE 100 MG PO CAPS: 100.0000 mg | ORAL_CAPSULE | Freq: Three times a day (TID) | ORAL | 0 refills | Status: DC | PRN

## 2019-02-04 MED ORDER — ALBUTEROL SULFATE HFA 108 (90 BASE) MCG/ACT IN AERS
1.0000 | INHALATION_SPRAY | RESPIRATORY_TRACT | Status: DC | PRN
  Administered 2019-02-04: 2 via RESPIRATORY_TRACT
  Filled 2019-02-04: qty 6.7

## 2019-02-04 MED ORDER — PREDNISONE 20 MG PO TABS: ORAL_TABLET | ORAL | 0 refills | Status: DC

## 2019-02-04 MED ORDER — METHYLPREDNISOLONE SODIUM SUCC 125 MG IJ SOLR
125.0000 mg | Freq: Once | INTRAMUSCULAR | Status: AC
  Administered 2019-02-04: 125 mg via INTRAMUSCULAR
  Filled 2019-02-04: qty 2

## 2019-02-04 NOTE — ED Triage Notes (Signed)
PResents with one week of cough that is described as productive and hacking. Lying on the left side makes the cough worse. She does not smoke. She has been taking cough syrup with some releif. She reports that she has had a cough like this previously when she had PNA. Endorses SOB, denies nauseas and diarrhea. Denies CP.

## 2019-02-04 NOTE — ED Provider Notes (Signed)
[facility] EMERGENCY DEPARTMENT Provider Note   CSN: [id number] Arrival date & time: 02/04/19  1624    History   Chief Complaint Chief Complaint  Patient presents with  . Cough    HPI Joy [name] is a 53 y.o. female.     HPI Patient with 1 week of mostly nonproductive cough worse at night.  States she is taking a lot of cough medication with no relief.  She ran out of her inhaler.  Denies fever or chills.  No body aches.  No known sick contacts.  Has had some intermittent shortness of breath but denies chest pain.  No new lower extremity swelling or pain. Past Medical History:  Diagnosis Date  . Meningitis 2010   hospitalized  . Osteoarthritis 2015  . Rheumatoid arthritis ([place]) 2015    Patient Active Problem List   Diagnosis Date Noted  . Moderate persistent asthma 12/07/2015  . Dermatitis, contact 12/07/2015  . Allergic rhinitis due to pollen 12/07/2015  . Rheumatoid arthritis ([place]) 12/07/2015  . Vaginal discharge 09/29/2015  . Folate deficiency 11/20/2014  . Fatty liver 11/20/2014  . Routine general medical examination at a health care facility 09/26/2013  . Joint pain 09/26/2013  . ANEMIA, PERNICIOUS 07/05/2010  . OBESITY 07/01/2010    Past Surgical History:  Procedure Laterality Date  . ABDOMINAL HYSTERECTOMY  1994   partial  . BREAST SURGERY  2003   BIOPSY LEFT BREAST--BENIGN  . FOOT SURGERY Right   . TONSILLECTOMY       OB History    Gravida  0   Para  0   Term  0   Preterm  0   AB  0   Living  0     SAB  0   TAB  0   Ectopic  0   Multiple  0   Live Births  0            Home Medications    Prior to Admission medications   Medication Sig Start Date End Date Taking? Authorizing Provider  albuterol (PROAIR HFA) 108 (90 Base) MCG/ACT inhaler Inhale 2 puffs into the lungs every 4 (four) hours as needed for wheezing or shortness of breath. 12/07/15   Shaune Leeks, Jens Som, MD  beclomethasone (QVAR) 40 MCG/ACT  inhaler TWO PUFFS TWICE A DAY TO PREVENT COUGH OR WHEEZE. RINSE, GARGLE AND SPIT AFTER USE 12/14/15   Charlies Silvers, MD  benzonatate (TESSALON) 100 MG capsule Take 1 capsule (100 mg total) by mouth 3 (three) times daily as needed for cough. 02/04/19   Julianne Rice, MD  cetirizine (ZYRTEC ALLERGY) 10 MG tablet Take 1 tablet (10 mg total) by mouth daily. 11/15/16   Julianne Rice, MD  fluticasone (FLONASE) 50 MCG/ACT nasal spray Place 2 sprays into both nostrils daily. 11/15/16   Julianne Rice, MD  fluticasone (FLOVENT HFA) 44 MCG/ACT inhaler Inhale 2 puffs into the lungs once. 12/16/15   Leda Roys, MD  folic acid (FOLVITE) 1 MG tablet TK 1 T PO D 11/11/15   [provider]  methotrexate (RHEUMATREX) 2.5 MG tablet Take 15 mg by mouth once a week. Caution:Chemotherapy. Protect from light.    [provider]  predniSONE (DELTASONE) 20 MG tablet 3 tabs po day one, then 2 po daily x 4 days 02/05/19   Julianne Rice, MD    Family History Family History  Problem Relation Age of Onset  . Stroke Mother   . Blindness Mother  in left eye due to stroke  . Glaucoma Mother        caused blindness of right eye  . Cancer Maternal Aunt 60       ovarian  . Cancer Maternal Grandmother 60       colon  . Diabetes Maternal Aunt   . Diabetes Maternal Aunt     Social History Social History   Tobacco Use  . Smoking status: Never Smoker  . Smokeless tobacco: Never Used  Substance Use Topics  . Alcohol use: No  . Drug use: No     Allergies   Patient has no known allergies.   Review of Systems Review of Systems  Constitutional: Negative for chills and fever.  HENT: Negative for congestion, sore throat and trouble swallowing.   Respiratory: Positive for cough, shortness of breath and wheezing.   Cardiovascular: Negative for chest pain.  Gastrointestinal: Negative for abdominal pain, nausea and vomiting.  Musculoskeletal: Negative for arthralgias, back pain, myalgias  and neck pain.  Skin: Negative for rash and wound.  Neurological: Negative for dizziness, weakness, light-headedness, numbness and headaches.  All other systems reviewed and are negative.    Physical Exam Updated Vital Signs BP (!) 133/95 (BP Location: Right Arm)   Pulse 85   Temp 98.7 F (37.1 C) (Oral)   Resp 18   Ht 5\' 5"  (1.651 m)   Wt 125.6 kg   SpO2 96%   BMI 46.10 kg/m   Physical Exam Vitals signs and nursing note reviewed.  Constitutional:      Appearance: Normal appearance. She is well-developed.  HENT:     Head: Normocephalic and atraumatic.     Nose: Nose normal.     Mouth/Throat:     Mouth: Mucous membranes are moist.     Pharynx: No oropharyngeal exudate or posterior oropharyngeal erythema.  Eyes:     Pupils: Pupils are equal, round, and reactive to light.  Neck:     Musculoskeletal: Normal range of motion and neck supple. No neck rigidity or muscular tenderness.  Cardiovascular:     Rate and Rhythm: Normal rate and regular rhythm.     Heart sounds: No murmur. No friction rub. No gallop.   Pulmonary:     Effort: Pulmonary effort is normal.     Breath sounds: Wheezing present.     Comments: Inspiratory and expiratory wheezing.  No patient is in no respiratory distress. Abdominal:     General: Bowel sounds are normal.     Palpations: Abdomen is soft.     Tenderness: There is no abdominal tenderness. There is no guarding or rebound.  Musculoskeletal: Normal range of motion.        General: No swelling, tenderness, deformity or signs of injury.     Right lower leg: No edema.     Left lower leg: No edema.  Lymphadenopathy:     Cervical: No cervical adenopathy.  Skin:    General: Skin is warm and dry.     Findings: No erythema or rash.  Neurological:     General: No focal deficit present.     Mental Status: She is alert and oriented to person, place, and time.  Psychiatric:        Mood and Affect: Mood normal.        Behavior: Behavior normal.       ED Treatments / Results  Labs (all labs ordered are listed, but only abnormal results are displayed) Labs Reviewed - No data to display  EKG None  Radiology Dg Chest 2 View  Result Date: 02/04/2019 CLINICAL DATA:  One week history of cough. EXAM: CHEST - 2 VIEW COMPARISON:  08/14/2016 FINDINGS: The cardiac silhouette, mediastinal and hilar contours are within normal limits and stable. Mild peribronchial thickening could suggest mild bronchitis. No infiltrates or effusions. No worrisome pulmonary lesions. The bony thorax is intact. IMPRESSION: 1. Mild peribronchial thickening and slight increased interstitial markings may suggest bronchitis. 2. No infiltrates or effusions. Electronically Signed   By: Marijo Sanes M.D.   On: 02/04/2019 17:54    Procedures Procedures (including critical care time)  Medications Ordered in ED Medications  methylPREDNISolone sodium succinate (SOLU-MEDROL) 125 mg/2 mL injection 125 mg (has no administration in time range)  albuterol (VENTOLIN HFA) 108 (90 Base) MCG/ACT inhaler 1-2 puff (2 puffs Inhalation Given 02/04/19 1807)     Initial Impression / Assessment and Plan / ED Course  I have reviewed the triage vital signs and the nursing notes.  Pertinent labs & imaging results that were available during my care of the patient were reviewed by me and considered in my medical decision making (see chart for details).       No evidence of pneumonia on x-ray.  Likely bronchitis with bronchospasm.  Given albuterol inhaler in the emergency department and IM dose of Solu-Medrol.  Will discharge with course of prednisone.  Return precautions given.   Final Clinical Impressions(s) / ED Diagnoses   Final diagnoses:  Bronchitis with bronchospasm    ED Discharge Orders         Ordered    predniSONE (DELTASONE) 20 MG tablet     02/04/19 1810    benzonatate (TESSALON) 100 MG capsule  3 times daily PRN     02/04/19 1810           Julianne Rice, MD  02/04/19 1811

## 2019-07-07 ENCOUNTER — Other Ambulatory Visit: Payer: Self-pay

## 2019-07-07 DIAGNOSIS — Z20822 Contact with and (suspected) exposure to covid-19: Secondary | ICD-10-CM

## 2019-07-09 LAB — NOVEL CORONAVIRUS, NAA: SARS-CoV-2, NAA: NOT DETECTED

## 2019-07-31 LAB — HM COLONOSCOPY

## 2019-10-17 ENCOUNTER — Ambulatory Visit (INDEPENDENT_AMBULATORY_CARE_PROVIDER_SITE_OTHER): Payer: Medicare Other | Admitting: Family

## 2019-10-17 ENCOUNTER — Ambulatory Visit: Payer: Self-pay | Admitting: Family

## 2019-10-17 ENCOUNTER — Encounter: Payer: Self-pay | Admitting: Family

## 2019-10-17 ENCOUNTER — Other Ambulatory Visit: Payer: Self-pay

## 2019-10-17 DIAGNOSIS — D510 Vitamin B12 deficiency anemia due to intrinsic factor deficiency: Secondary | ICD-10-CM

## 2019-10-17 DIAGNOSIS — E785 Hyperlipidemia, unspecified: Secondary | ICD-10-CM | POA: Diagnosis not present

## 2019-10-17 DIAGNOSIS — N189 Chronic kidney disease, unspecified: Secondary | ICD-10-CM | POA: Insufficient documentation

## 2019-10-17 DIAGNOSIS — I10 Essential (primary) hypertension: Secondary | ICD-10-CM | POA: Insufficient documentation

## 2019-10-17 DIAGNOSIS — M069 Rheumatoid arthritis, unspecified: Secondary | ICD-10-CM

## 2019-10-17 DIAGNOSIS — K760 Fatty (change of) liver, not elsewhere classified: Secondary | ICD-10-CM

## 2019-10-17 DIAGNOSIS — E538 Deficiency of other specified B group vitamins: Secondary | ICD-10-CM

## 2019-10-17 NOTE — Progress Notes (Signed)
Subjective:    Patient ID: Joy [name], female    DOB: 05/21/1966, 54 y.o.   MRN: [id number]  HPI   Patient is a 54 yr old female who presents today to re-establish care. I last saw her 3/17.  She was without insurance for some time and was followed at the community care clinic.  She was recently placed on disability and obtained insurance.  Rheumatoid Arthritis- working with Dr. Gerilyn Nestle. Reports that she is on weekly methotrexate. Still having joint pains.  She has not noted much of an improvement on this medication.  CKD- seeing Dr. Audie Clear, nephrology.  Review of lab work in care everywhere shows that her BUN on 09/03/2019 was 11 creatinine was 1.11.  Estimated GFR 57  Hyperlipidemia- maintained on atorvastatin.  Asthma- uses qvar generally in the summer only.  Uses otc allergy medication as needed.    HTN- maintained on lisinopril.  BP Readings from Last 3 Encounters:  10/17/19 124/73  02/04/19 (!) 133/95  11/15/16 127/79   Morbid obesity-  Wt Readings from Last 3 Encounters:  10/17/19 274 lb (124.3 kg)  02/04/19 277 lb (125.6 kg)  11/15/16 270 lb (122.5 kg)        Review of Systems See HPI  Past Medical History:  Diagnosis Date  . Meningitis 2010   hospitalized  . Osteoarthritis 2015  . Rheumatoid arthritis ([place]) 2015     Social History   Socioeconomic History  . Marital status: Divorced    Spouse name: [name]  . Number of children: 2  . Years of education: Not on file  . Highest education level: Not on file  Occupational History  . Occupation: ORDER Therapist, music: [employer]  Tobacco Use  . Smoking status: Never Smoker  . Smokeless tobacco: Never Used  Substance and Sexual Activity  . Alcohol use: No  . Drug use: No  . Sexual activity: Not on file  Other Topics Concern  . Not on file  Social History Narrative   On disability   Previously worked at [employer]   One Son- 7 minutes away- he has 3 sons   One daughter-  currently lives with pt   No pets   Single   Enjoys reading   Social Determinants of Radio broadcast assistant Strain:   . Difficulty of Paying Living Expenses: Not on file  Food Insecurity:   . Worried About Charity fundraiser in the Last Year: Not on file  . Ran Out of Food in the Last Year: Not on file  Transportation Needs:   . Lack of Transportation (Medical): Not on file  . Lack of Transportation (Non-Medical): Not on file  Physical Activity:   . Days of Exercise per Week: Not on file  . Minutes of Exercise per Session: Not on file  Stress:   . Feeling of Stress : Not on file  Social Connections:   . Frequency of Communication with Friends and Family: Not on file  . Frequency of Social Gatherings with Friends and Family: Not on file  . Attends Religious Services: Not on file  . Active Member of Clubs or Organizations: Not on file  . Attends Archivist Meetings: Not on file  . Marital Status: Not on file  Intimate Partner Violence:   . Fear of Current or Ex-Partner: Not on file  . Emotionally Abused: Not on file  . Physically Abused: Not on file  . Sexually Abused: Not  on file    Past Surgical History:  Procedure Laterality Date  . ABDOMINAL HYSTERECTOMY  1994   partial  . BREAST SURGERY  2003   BIOPSY LEFT BREAST--BENIGN  . FOOT SURGERY Right   . TONSILLECTOMY      Family History  Problem Relation Age of Onset  . Stroke Mother   . Blindness Mother        in left eye due to stroke  . Glaucoma Mother        caused blindness of right eye  . Dementia Mother        died at 61  . Cancer Maternal Aunt 60       ovarian  . Cancer Maternal Grandmother 60       colon  . Diabetes Maternal Aunt   . Diabetes Maternal Aunt     No Known Allergies  Current Outpatient Medications on File Prior to Visit  Medication Sig Dispense Refill  . albuterol (PROAIR HFA) 108 (90 Base) MCG/ACT inhaler Inhale 2 puffs into the lungs every 4 (four) hours as needed  for wheezing or shortness of breath. 8 g 2  . atorvastatin (LIPITOR) 20 MG tablet Take 20 mg by mouth daily.    . beclomethasone (QVAR) 40 MCG/ACT inhaler TWO PUFFS TWICE A DAY TO PREVENT COUGH OR WHEEZE. RINSE, GARGLE AND SPIT AFTER USE 1 Inhaler 5  . cetirizine (ZYRTEC ALLERGY) 10 MG tablet Take 1 tablet (10 mg total) by mouth daily. 30 tablet 0  . fluticasone (FLONASE) 50 MCG/ACT nasal spray Place 2 sprays into both nostrils daily. 16 g 0  . fluticasone (FLOVENT HFA) 44 MCG/ACT inhaler Inhale 2 puffs into the lungs once. 1 Inhaler 0  . folic acid (FOLVITE) 1 MG tablet TK 1 T PO D  0  . lisinopril (ZESTRIL) 10 MG tablet Take 10 mg by mouth daily.    . methotrexate (RHEUMATREX) 2.5 MG tablet Take 15 mg by mouth once a week. Caution:Chemotherapy. Protect from light.     No current facility-administered medications on file prior to visit.    BP 124/73 (BP Location: Right Arm, Patient Position: Sitting, Cuff Size: Large)   Pulse (!) 103   Temp (!) 97.4 F (36.3 C) (Temporal)   Resp 16   Ht 5\' 5"  (1.651 m)   Wt 274 lb (124.3 kg)   SpO2 100%   BMI 45.60 kg/m       Objective:   Physical Exam Constitutional:      Appearance: She is well-developed.  Neck:     Thyroid: No thyromegaly.  Cardiovascular:     Rate and Rhythm: Normal rate and regular rhythm.     Heart sounds: Normal heart sounds. No murmur.  Pulmonary:     Effort: Pulmonary effort is normal. No respiratory distress.     Breath sounds: Normal breath sounds. No wheezing.  Musculoskeletal:     Cervical back: Neck supple.  Skin:    General: Skin is warm and dry.  Neurological:     Mental Status: She is alert and oriented to person, place, and time.  Psychiatric:        Behavior: Behavior normal.        Thought Content: Thought content normal.        Judgment: Judgment normal.           Assessment & Plan:  Hypertension-blood pressure stable on current dose of lisinopril.  Continue  same.  Hyperlipidemia-tolerating statin.  Continue same.  Plan to check  follow-up lipid panel at her upcoming physical.  Rheumatoid arthritis-fair control, management per rheumatology.  Chronic kidney disease-creatinine stable as of blood draw in January.  Defer management to nephrology.  She is followed by Dr.Sadiq at [facility].  Morbid obesity-she is frustrated by her inability to lose weight.  I have placed a referral to our medical weight loss clinic.  History of pernicious anemia-hemoglobin was 10.8 back in January.  We will plan to repeat CBC as well as obtain a B12/folate level at her upcoming physical.  This visit occurred during the SARS-CoV-2 public health emergency.  Safety protocols were in place, including screening questions prior to the visit, additional usage of staff PPE, and extensive cleaning of exam room while observing appropriate contact time as indicated for disinfecting solutions.

## 2019-11-07 DIAGNOSIS — M0609 Rheumatoid arthritis without rheumatoid factor, multiple sites: Secondary | ICD-10-CM | POA: Diagnosis not present

## 2019-11-07 DIAGNOSIS — M159 Polyosteoarthritis, unspecified: Secondary | ICD-10-CM | POA: Diagnosis not present

## 2019-11-07 DIAGNOSIS — Z79899 Other long term (current) drug therapy: Secondary | ICD-10-CM | POA: Diagnosis not present

## 2019-11-11 DIAGNOSIS — D1632 Benign neoplasm of short bones of left lower limb: Secondary | ICD-10-CM | POA: Diagnosis not present

## 2019-11-11 DIAGNOSIS — M7752 Other enthesopathy of left foot: Secondary | ICD-10-CM | POA: Diagnosis not present

## 2019-11-11 DIAGNOSIS — M722 Plantar fascial fibromatosis: Secondary | ICD-10-CM | POA: Diagnosis not present

## 2019-11-12 DIAGNOSIS — Z79899 Other long term (current) drug therapy: Secondary | ICD-10-CM | POA: Diagnosis not present

## 2019-11-19 ENCOUNTER — Telehealth: Payer: Self-pay | Admitting: Family

## 2019-11-19 ENCOUNTER — Other Ambulatory Visit: Payer: Self-pay

## 2019-11-19 MED ORDER — LISINOPRIL 10 MG PO TABS: 10.0000 mg | ORAL_TABLET | Freq: Every day | ORAL | 0 refills | Status: DC

## 2019-11-19 MED ORDER — ATORVASTATIN CALCIUM 20 MG PO TABS: 20.0000 mg | ORAL_TABLET | Freq: Every day | ORAL | 0 refills | Status: DC

## 2019-11-19 NOTE — Telephone Encounter (Signed)
[facility] called to request refills on  Lisinopril, Atorvastatin, Folic acid and Methotrexate. pharmacyst advised we will send Atorvastatin and Lisinopril 90 days supply as per office visit note per Debbrah Alar to continue this medications for Hypertension and Hight cholesterol.  Provider will review and send folic acid and methotrexate if indicated

## 2019-11-20 MED ORDER — FOLIC ACID 1 MG PO TABS: 1.0000 mg | ORAL_TABLET | Freq: Every day | ORAL | 1 refills | Status: DC

## 2019-11-20 NOTE — Telephone Encounter (Signed)
Folic acid sent. Please advise pt to contact her rheumatologist re: methotrexate refill.

## 2019-11-24 ENCOUNTER — Other Ambulatory Visit: Payer: Self-pay

## 2019-11-24 DIAGNOSIS — M79641 Pain in right hand: Secondary | ICD-10-CM | POA: Diagnosis not present

## 2019-11-24 DIAGNOSIS — M79642 Pain in left hand: Secondary | ICD-10-CM | POA: Diagnosis not present

## 2019-11-24 DIAGNOSIS — M170 Bilateral primary osteoarthritis of knee: Secondary | ICD-10-CM | POA: Diagnosis not present

## 2019-11-24 DIAGNOSIS — M1811 Unilateral primary osteoarthritis of first carpometacarpal joint, right hand: Secondary | ICD-10-CM | POA: Diagnosis not present

## 2019-11-24 NOTE — Telephone Encounter (Signed)
Patient advised of refill sent and advised to call rheumatology for methotraxate.

## 2019-11-25 ENCOUNTER — Encounter: Payer: Self-pay | Admitting: Family

## 2019-11-25 ENCOUNTER — Other Ambulatory Visit: Payer: Self-pay

## 2019-11-25 ENCOUNTER — Telehealth: Payer: Self-pay | Admitting: Family

## 2019-11-25 ENCOUNTER — Ambulatory Visit (INDEPENDENT_AMBULATORY_CARE_PROVIDER_SITE_OTHER): Payer: Medicare Other | Admitting: Family

## 2019-11-25 VITALS — BP 118/76 | HR 79 | Temp 97.6°F | Resp 16 | Ht 65.0 in | Wt 271.0 lb

## 2019-11-25 DIAGNOSIS — R5383 Other fatigue: Secondary | ICD-10-CM

## 2019-11-25 DIAGNOSIS — R400 Somnolence: Secondary | ICD-10-CM

## 2019-11-25 DIAGNOSIS — D649 Anemia, unspecified: Secondary | ICD-10-CM

## 2019-11-25 DIAGNOSIS — M722 Plantar fascial fibromatosis: Secondary | ICD-10-CM | POA: Diagnosis not present

## 2019-11-25 DIAGNOSIS — Z0000 Encounter for general adult medical examination without abnormal findings: Secondary | ICD-10-CM | POA: Diagnosis not present

## 2019-11-25 DIAGNOSIS — I10 Essential (primary) hypertension: Secondary | ICD-10-CM | POA: Diagnosis not present

## 2019-11-25 DIAGNOSIS — M7752 Other enthesopathy of left foot: Secondary | ICD-10-CM | POA: Diagnosis not present

## 2019-11-25 DIAGNOSIS — E785 Hyperlipidemia, unspecified: Secondary | ICD-10-CM

## 2019-11-25 DIAGNOSIS — E348 Other specified endocrine disorders: Secondary | ICD-10-CM | POA: Diagnosis not present

## 2019-11-25 LAB — CBC WITH DIFFERENTIAL/PLATELET
Basophils Absolute: 0.1 10*3/uL (ref 0.0–0.1)
Basophils Relative: 0.9 % (ref 0.0–3.0)
Eosinophils Absolute: 0.1 10*3/uL (ref 0.0–0.7)
Eosinophils Relative: 1.4 % (ref 0.0–5.0)
HCT: 35.2 % — ABNORMAL LOW (ref 36.0–46.0)
Hemoglobin: 11.6 g/dL — ABNORMAL LOW (ref 12.0–15.0)
Lymphocytes Relative: 26.3 % (ref 12.0–46.0)
Lymphs Abs: 1.8 10*3/uL (ref 0.7–4.0)
MCHC: 32.9 g/dL (ref 30.0–36.0)
MCV: 92.8 fl (ref 78.0–100.0)
Monocytes Absolute: 0.5 10*3/uL (ref 0.1–1.0)
Monocytes Relative: 6.8 % (ref 3.0–12.0)
Neutro Abs: 4.5 10*3/uL (ref 1.4–7.7)
Neutrophils Relative %: 64.6 % (ref 43.0–77.0)
Platelets: 286 10*3/uL (ref 150.0–400.0)
RBC: 3.79 Mil/uL — ABNORMAL LOW (ref 3.87–5.11)
RDW: 13.2 % (ref 11.5–15.5)
WBC: 6.9 10*3/uL (ref 4.0–10.5)

## 2019-11-25 LAB — COMPREHENSIVE METABOLIC PANEL
ALT: 16 U/L (ref 0–35)
AST: 19 U/L (ref 0–37)
Albumin: 4 g/dL (ref 3.5–5.2)
Alkaline Phosphatase: 104 U/L (ref 39–117)
BUN: 12 mg/dL (ref 6–23)
CO2: 31 mEq/L (ref 19–32)
Calcium: 9 mg/dL (ref 8.4–10.5)
Chloride: 102 mEq/L (ref 96–112)
Creatinine, Ser: 1.05 mg/dL (ref 0.40–1.20)
GFR: 66.13 mL/min (ref >60.00)
Glucose, Bld: 94 mg/dL (ref 70–99)
Potassium: 4.4 mEq/L (ref 3.5–5.1)
Sodium: 139 mEq/L (ref 135–145)
Total Bilirubin: 0.6 mg/dL (ref 0.2–1.2)
Total Protein: 7 g/dL (ref 6.0–8.3)

## 2019-11-25 LAB — LIPID PANEL
Cholesterol: 214 mg/dL — ABNORMAL HIGH (ref 0–200)
HDL: 41.8 mg/dL (ref >39.00)
LDL Cholesterol: 156 mg/dL — ABNORMAL HIGH (ref 0–99)
NonHDL: 172.25
Total CHOL/HDL Ratio: 5
Triglycerides: 82 mg/dL (ref 0.0–149.0)
VLDL: 16.4 mg/dL (ref 0.0–40.0)

## 2019-11-25 LAB — FERRITIN: Ferritin: 242.6 ng/mL (ref 10.0–291.0)

## 2019-11-25 LAB — IRON: Iron: 46 ug/dL (ref 42–145)

## 2019-11-25 LAB — TSH: TSH: 2.24 u[IU]/mL (ref 0.35–4.50)

## 2019-11-25 NOTE — Progress Notes (Signed)
Subjective:    Patient ID: Joy [name], female    DOB: Oct 19, 1965, 54 y.o.   MRN: [id number]  HPI  Patient is a 54 yr old female who presents today for cpx.  Immunizations: scheduled for pfizer #2 on 4/17 Diet: She reports that diet needs improvement. Wt Readings from Last 3 Encounters:  11/25/19 271 lb (122.9 kg)  10/17/19 274 lb (124.3 kg)  02/04/19 277 lb (125.6 kg)   Reports that she does not eat enough. Reports poor appetite.    24 hr recall  Water, lemonade (1) with dinner.    Breakfast- none  8 PM hibachi chicken/veggies/rice  Exercise: can't walk due to arthritis in her knees.   Colonoscopy: 9/20- no polyps- 5 year follow up requested Pap Smear: 08/20/19- Rolanda Lundborg Mammogram: 08/06/19 Vision: due, scheduled Dental- up to date  Reports positive daytime somnolence   Review of Systems  Constitutional: Negative for unexpected weight change.  HENT: Negative for hearing loss and rhinorrhea.   Eyes: Positive for visual disturbance (some issues with reading).  Respiratory: Negative for cough and shortness of breath.   Cardiovascular: Negative for chest pain.  Gastrointestinal: Negative for blood in stool, constipation and diarrhea.  Genitourinary: Negative for dysuria, frequency and hematuria.  Musculoskeletal: Positive for arthralgias (bilateral knees/hands).  Skin: Negative for rash.  Neurological: Negative for headaches.  Hematological: Negative for adenopathy.  Psychiatric/Behavioral:       Denies depression/anxiety     Past Medical History:  Diagnosis Date  . Meningitis 2010   hospitalized  . Osteoarthritis 2015  . Rheumatoid arthritis ([place]) 2015     Social History   Socioeconomic History  . Marital status: Divorced    Spouse name: [name]  . Number of children: 2  . Years of education: Not on file  . Highest education level: Not on file  Occupational History  . Occupation: ORDER Therapist, music: [employer]    Tobacco Use  . Smoking status: Never Smoker  . Smokeless tobacco: Never Used  Substance and Sexual Activity  . Alcohol use: No  . Drug use: No  . Sexual activity: Not on file  Other Topics Concern  . Not on file  Social History Narrative   On disability   Previously worked at [employer]   One Son- 85 minutes away- he has 3 sons   One daughter- currently lives with pt   No pets   Single   Enjoys reading   Social Determinants of Radio broadcast assistant Strain:   . Difficulty of Paying Living Expenses:   Food Insecurity:   . Worried About Charity fundraiser in the Last Year:   . Arboriculturist in the Last Year:   Transportation Needs:   . Film/video editor (Medical):   Marland Kitchen Lack of Transportation (Non-Medical):   Physical Activity:   . Days of Exercise per Week:   . Minutes of Exercise per Session:   Stress:   . Feeling of Stress :   Social Connections:   . Frequency of Communication with Friends and Family:   . Frequency of Social Gatherings with Friends and Family:   . Attends Religious Services:   . Active Member of Clubs or Organizations:   . Attends Archivist Meetings:   Marland Kitchen Marital Status:   Intimate Partner Violence:   . Fear of Current or Ex-Partner:   . Emotionally Abused:   Marland Kitchen Physically Abused:   . Sexually  Abused:     Past Surgical History:  Procedure Laterality Date  . ABDOMINAL HYSTERECTOMY  1994   partial  . BREAST SURGERY  2003   BIOPSY LEFT BREAST--BENIGN  . FOOT SURGERY Right   . TONSILLECTOMY      Family History  Problem Relation Age of Onset  . Stroke Mother   . Blindness Mother        in left eye due to stroke  . Glaucoma Mother        caused blindness of right eye  . Dementia Mother        died at 52  . Cancer Maternal Aunt 60       ovarian  . Cancer Maternal Grandmother 60       colon  . Diabetes Maternal Aunt   . Diabetes Maternal Aunt     No Known Allergies  Current Outpatient Medications on File  Prior to Visit  Medication Sig Dispense Refill  . albuterol (PROAIR HFA) 108 (90 Base) MCG/ACT inhaler Inhale 2 puffs into the lungs every 4 (four) hours as needed for wheezing or shortness of breath. 8 g 2  . atorvastatin (LIPITOR) 20 MG tablet Take 1 tablet (20 mg total) by mouth daily. 90 tablet 0  . beclomethasone (QVAR) 40 MCG/ACT inhaler TWO PUFFS TWICE A DAY TO PREVENT COUGH OR WHEEZE. RINSE, GARGLE AND SPIT AFTER USE 1 Inhaler 5  . cetirizine (ZYRTEC ALLERGY) 10 MG tablet Take 1 tablet (10 mg total) by mouth daily. 30 tablet 0  . fluticasone (FLONASE) 50 MCG/ACT nasal spray Place 2 sprays into both nostrils daily. 16 g 0  . fluticasone (FLOVENT HFA) 44 MCG/ACT inhaler Inhale 2 puffs into the lungs once. 1 Inhaler 0  . folic acid (FOLVITE) 1 MG tablet Take 1 tablet (1 mg total) by mouth daily. 90 tablet 1  . lisinopril (ZESTRIL) 10 MG tablet Take 1 tablet (10 mg total) by mouth daily. 90 tablet 0  . methotrexate (RHEUMATREX) 2.5 MG tablet Take 15 mg by mouth once a week. Caution:Chemotherapy. Protect from light.     No current facility-administered medications on file prior to visit.    BP 118/76 (BP Location: Right Arm, Patient Position: Sitting, Cuff Size: Large)   Pulse 79   Temp 97.6 F (36.4 C) (Temporal)   Resp 16   Ht 5\' 5"  (1.651 m)   Wt 271 lb (122.9 kg)   SpO2 100%   BMI 45.10 kg/m       Objective:   Physical Exam   Physical Exam  Constitutional: She is oriented to person, place, and time. She appears well-developed and well-nourished. No distress.  HENT:  Head: Normocephalic and atraumatic.  Right Ear: Tympanic membrane and ear canal normal.  Left Ear: Tympanic membrane and ear canal normal.  Mouth/Throat: Patient wearing mask Eyes: Pupils are equal, round, and reactive to light. No scleral icterus.  Neck: Normal range of motion. No thyromegaly present.  Cardiovascular: Normal rate and regular rhythm.   No murmur heard. Pulmonary/Chest: Effort normal and  breath sounds normal. No respiratory distress. He has no wheezes. She has no rales. She exhibits no tenderness.  Abdominal: Soft. Bowel sounds are normal. She exhibits no distension and no mass. There is no tenderness. There is no rebound and no guarding.  Musculoskeletal: She exhibits no edema.  Lymphadenopathy:    She has no cervical adenopathy.  Neurological: She is alert and oriented to person, place, and time. She has normal patellar reflexes. She exhibits  normal muscle tone. Coordination normal.  Skin: Skin is warm and dry.  Psychiatric: She has a normal mood and affect. Her behavior is normal. Judgment and thought content normal.  Breast/pelvis:  deferred           Assessment & Plan:  Preventive care-we will obtain labs as ordered. Obtain baseline bone density.  She is a candidate for Shingrix.  She is also due for tetanus booster.  However since she is in the middle of her Covid vaccine series will defer at this time.  Mammogram and colonoscopy are up-to-date.  Daytime somnolence-given habitus, positive snoring and daytime somnolence, I am concerned about the possibility of obstructive sleep apnea.  Will refer to pulmonology for further evaluation and testing.  This visit occurred during the SARS-CoV-2 public health emergency.  Safety protocols were in place, including screening questions prior to the visit, additional usage of staff PPE, and extensive cleaning of exam room while observing appropriate contact time as indicated for disinfecting solutions.        Assessment & Plan:

## 2019-11-25 NOTE — Patient Instructions (Signed)
Please complete lab work prior to leaving. You should be contacted about scheduling your appointment with the pulmonary doctor for sleep evaluation. Continue to work on a healthy diet, (eating 3 meals a day) and getting regular exercise.

## 2019-11-25 NOTE — Telephone Encounter (Signed)
Please request colo from [facility] on [address]- done last year Acquanetta Sit).  Also pap smear for [place] Rolanda Lundborg NP).

## 2019-11-26 ENCOUNTER — Ambulatory Visit (HOSPITAL_BASED_OUTPATIENT_CLINIC_OR_DEPARTMENT_OTHER)
Admission: RE | Admit: 2019-11-26 | Discharge: 2019-11-26 | Disposition: A | Discharge status: Home / Self Care | Payer: Medicare Other | Source: Ambulatory Visit | Attending: Family | Admitting: Family

## 2019-11-26 ENCOUNTER — Encounter: Payer: Self-pay | Admitting: Family

## 2019-11-26 DIAGNOSIS — E348 Other specified endocrine disorders: Secondary | ICD-10-CM

## 2019-11-26 DIAGNOSIS — Z13820 Encounter for screening for osteoporosis: Secondary | ICD-10-CM | POA: Diagnosis not present

## 2019-11-26 DIAGNOSIS — E2839 Other primary ovarian failure: Secondary | ICD-10-CM | POA: Diagnosis not present

## 2019-11-26 DIAGNOSIS — Z780 Asymptomatic menopausal state: Secondary | ICD-10-CM | POA: Diagnosis not present

## 2019-11-26 NOTE — Telephone Encounter (Signed)
Records release request faxed to both.

## 2019-12-03 DIAGNOSIS — M1811 Unilateral primary osteoarthritis of first carpometacarpal joint, right hand: Secondary | ICD-10-CM | POA: Diagnosis not present

## 2019-12-03 DIAGNOSIS — M79644 Pain in right finger(s): Secondary | ICD-10-CM | POA: Diagnosis not present

## 2019-12-04 DIAGNOSIS — M0609 Rheumatoid arthritis without rheumatoid factor, multiple sites: Secondary | ICD-10-CM | POA: Diagnosis not present

## 2019-12-16 ENCOUNTER — Ambulatory Visit (INDEPENDENT_AMBULATORY_CARE_PROVIDER_SITE_OTHER): Payer: Medicare Other | Admitting: Family Medicine

## 2019-12-16 ENCOUNTER — Encounter (INDEPENDENT_AMBULATORY_CARE_PROVIDER_SITE_OTHER): Payer: Self-pay | Admitting: Family Medicine

## 2019-12-16 ENCOUNTER — Ambulatory Visit (INDEPENDENT_AMBULATORY_CARE_PROVIDER_SITE_OTHER): Payer: 59 | Admitting: Family Medicine

## 2019-12-16 ENCOUNTER — Other Ambulatory Visit: Payer: Self-pay

## 2019-12-16 VITALS — BP 113/78 | HR 62 | Temp 97.9°F | Ht 64.0 in | Wt 268.0 lb

## 2019-12-16 DIAGNOSIS — Z6841 Body Mass Index (BMI) 40.0 and over, adult: Secondary | ICD-10-CM | POA: Diagnosis not present

## 2019-12-16 DIAGNOSIS — E559 Vitamin D deficiency, unspecified: Secondary | ICD-10-CM | POA: Diagnosis not present

## 2019-12-16 DIAGNOSIS — R0602 Shortness of breath: Secondary | ICD-10-CM

## 2019-12-16 DIAGNOSIS — Z0289 Encounter for other administrative examinations: Secondary | ICD-10-CM

## 2019-12-16 DIAGNOSIS — R739 Hyperglycemia, unspecified: Secondary | ICD-10-CM

## 2019-12-16 DIAGNOSIS — R5383 Other fatigue: Secondary | ICD-10-CM

## 2019-12-16 DIAGNOSIS — I10 Essential (primary) hypertension: Secondary | ICD-10-CM | POA: Diagnosis not present

## 2019-12-16 DIAGNOSIS — Z1331 Encounter for screening for depression: Secondary | ICD-10-CM

## 2019-12-16 NOTE — Progress Notes (Signed)
Dear Dr. Inda [name],   Thank you for referring Joy [name] to our clinic. The following note includes my evaluation and treatment recommendations.  Chief Complaint:   [place] (MR# Joy [name]) is a 54 y.o. female who presents for evaluation and treatment of obesity and related comorbidities. Current BMI is Body mass index is 46 kg/m. Joy [name] has been struggling with her weight for many years and has been unsuccessful in either losing weight, maintaining weight loss, or reaching her healthy weight goal.  Joy [name] is currently in the action stage of change and ready to dedicate time achieving and maintaining a healthier weight. Joy [name] is interested in becoming our patient and working on intensive lifestyle modifications including (but not limited to) diet and exercise for weight loss.  Joy [name] eats out about 4 times a week.  She is skipping breakfast everyday and lunch 1-2 times a week.  She will skip breakfast and then at 2 pm have a fast food burger from Joy [name] with fries and soft drink (eat and drink all and feel full).  Around 9 pm, she will have baked chicken (1 breast), mashed potatoes (3 spoon fulls), and green beans (3 spoon fulls) and feel full.  Joy [name]'s habits were reviewed today and are as follows: her desired weight loss is 101 pounds, she has been heavy most of her life, she started gaining weight after her first child was born, her heaviest weight ever was 279 pounds, she is a picky eater, she craves steak, pickles, soft drinks, she skips breakfast daily and lunch once or twice a week, she is frequently drinking liquids with calories, she frequently makes poor food choices and she frequently eats larger portions than normal.  Depression Screen Joy [name]'s Food and Mood (modified PHQ-9) score was 9.  Depression screen PHQ 2/9 12/16/2019  Decreased Interest 3  Down, Depressed, Hopeless 1  PHQ - 2 Score 4  Altered sleeping 1  Tired, decreased  energy 3  Change in appetite 0  Feeling bad or failure about yourself  0  Trouble concentrating 1  Moving slowly or fidgety/restless 0  Suicidal thoughts 0  PHQ-9 Score 9  Difficult doing work/chores Not difficult at all   Subjective:   1. Other fatigue Joy [name] admits to daytime somnolence and reports waking up still tired. Patent has a history of symptoms of daytime fatigue and morning fatigue. Joy [name] generally gets 5 hours of sleep per night, and states that she has poor quality sleep. Snoring is not present. Apneic episodes are not present. Epworth Sleepiness Score is 12.  2. Short of breath on exertion Joy [name] notes increasing shortness of breath with exercising and seems to be worsening over time with weight gain. She notes getting out of breath sooner with activity than she used to. This has gotten worse recently. Joy [name] denies shortness of breath at rest or orthopnea.  3. Essential hypertension Blood pressure is well controlled today.  No chest pain, chest pressure, or headache.  She has had this diagnosis for a long time.   BP Readings from Last 3 Encounters:  12/16/19 113/78  11/25/19 118/76  10/17/19 124/73   4. Hyperglycemia Joy [name] has a history of some elevated blood glucose readings without a diagnosis of diabetes.  She has no A1c in Epic.  5. Vitamin D deficiency Joy [name] was likely diagnosed due to her obesity.  She is currently taking no vitamin D supplement. She denies nausea, vomiting or muscle weakness.  6. Depression screening Joy [name] was screened  for depression as part of her new patient workup today.  Assessment/Plan:   1. Other fatigue Joy [name] does feel that her weight is causing her energy to be lower than it should be. Fatigue may be related to obesity, depression or many other causes. Labs will be ordered, and in the meanwhile, Joy [name] will focus on self care including making healthy food choices, increasing physical activity and  focusing on stress reduction. - EKG 12-Lead - CBC with Differential/Platelet - T3 - T4, free - TSH  2. Short of breath on exertion Joy [name] does feel that she gets out of breath more easily that she used to when she exercises. Joy [name]'s shortness of breath appears to be obesity related and exercise induced. She has agreed to work on weight loss and gradually increase exercise to treat her exercise induced shortness of breath. Will continue to monitor closely.  3. Essential hypertension Joy [name] is working on healthy weight loss and exercise to improve blood pressure control. We will watch for signs of hypotension as she continues her lifestyle modifications.  4. Hyperglycemia Fasting labs will be obtained and results with be discussed with Joy [name] in 2 weeks at her follow up visit. In the meanwhile Joy [name] was started on a lower simple carbohydrate diet and will work on weight loss efforts. - Hemoglobin A1c - Insulin, random  5. Vitamin D deficiency Low Vitamin D level contributes to fatigue and are associated with obesity, breast, and colon cancer. - VITAMIN D 25 Hydroxy (Vit-D Deficiency, Fractures)  6. Depression screening Joy [name] had a positive depression screening. Depression is commonly associated with obesity and often results in emotional eating behaviors. We will monitor this closely and work on CBT to help improve the non-hunger eating patterns. Referral to Psychology may be required if no improvement is seen as she continues in our clinic.  7. Class 3 severe obesity with serious comorbidity and body mass index (BMI) of 45.0 to 49.9 in adult, unspecified obesity type (HCC) Joy [name] is currently in the action stage of change and her goal is to continue with weight loss efforts. I recommend Joy [name] begin the structured treatment plan as follows:  She has agreed to the Category 1 Plan.  Exercise goals: No exercise has been prescribed at this time.   Behavioral  modification strategies: increasing lean protein intake, increasing vegetables, meal planning and cooking strategies, keeping healthy foods in the home and planning for success.  She was informed of the importance of frequent follow-up visits to maximize her success with intensive lifestyle modifications for her multiple health conditions. She was informed we would discuss her lab results at her next visit unless there is a critical issue that needs to be addressed sooner. Joy [name] agreed to keep her next visit at the agreed upon time to discuss these results.  Objective:   Blood pressure 113/78, pulse 62, temperature 97.9 F (36.6 C), temperature source Oral, height 5\' 4"  (1.626 m), weight 268 lb (121.6 kg), SpO2 98 %. Body mass index is 46 kg/m.  EKG: Normal sinus rhythm, rate 65 bpm with rate variation.  ST depression in II and III & AVF as well as V6.  T-wave flattening (no significant change from ECG done 2018).  Indirect Calorimeter completed today shows a VO2 of 161 and a REE of 1120.  Her calculated basal metabolic rate is 0000000 thus her basal metabolic rate is worse than expected.  General: Cooperative, alert, well developed, in no acute distress. HEENT: Conjunctivae and lids unremarkable. Cardiovascular: Regular rhythm.  Lungs:  Normal work of breathing. Neurologic: No focal deficits.   Lab Results  Component Value Date   CREATININE 1.05 11/25/2019   BUN 12 11/25/2019   NA 139 11/25/2019   K 4.4 11/25/2019   CL 102 11/25/2019   CO2 31 11/25/2019   Lab Results  Component Value Date   ALT 16 11/25/2019   AST 19 11/25/2019   ALKPHOS 104 11/25/2019   BILITOT 0.6 11/25/2019   Lab Results  Component Value Date   TSH 2.24 11/25/2019   Lab Results  Component Value Date   CHOL 214 (H) 11/25/2019   HDL 41.80 11/25/2019   LDLCALC 156 (H) 11/25/2019   TRIG 82.0 11/25/2019   CHOLHDL 5 11/25/2019   Lab Results  Component Value Date   WBC 6.9 11/25/2019   HGB 11.6 (L)  11/25/2019   HCT 35.2 (L) 11/25/2019   MCV 92.8 11/25/2019   PLT 286.0 11/25/2019   Lab Results  Component Value Date   IRON 46 11/25/2019   TIBC 252 09/26/2013   FERRITIN 242.6 11/25/2019   Obesity Behavioral Intervention Visit Documentation for Insurance:   Approximately 15 minutes were spent on the discussion below.  ASK: We discussed the diagnosis of obesity with Joy [name] today and Joy [name] agreed to give [name] permission to discuss obesity behavioral modification therapy today.  ASSESS: Joy [name] has the diagnosis of obesity and her BMI today is 46.1. Joy [name] is in the action stage of change.   ADVISE: Amarachi was educated on the multiple health risks of obesity as well as the benefit of weight loss to improve her health. She was advised of the need for long term treatment and the importance of lifestyle modifications to improve her current health and to decrease her risk of future health problems.  AGREE: Multiple dietary modification options and treatment options were discussed and Shabreka agreed to follow the recommendations documented in the above note.  ARRANGE: Gertha was educated on the importance of frequent visits to treat obesity as outlined per CMS and USPSTF guidelines and agreed to schedule her next follow up appointment today.  Attestation Statements:   Reviewed by clinician on day of visit: allergies, medications, problem list, medical history, surgical history, family history, social history, and previous encounter notes.  I, Water quality scientist, CMA, am acting as transcriptionist for Coralie Common, MD.  I have reviewed the above documentation for accuracy and completeness, and I agree with the above. - Jinny Blossom, MD

## 2019-12-17 LAB — HEMOGLOBIN A1C
Est. average glucose Bld gHb Est-mCnc: 114 mg/dL
Hgb A1c MFr Bld: 5.6 % (ref 4.8–5.6)

## 2019-12-17 LAB — CBC WITH DIFFERENTIAL/PLATELET
Basophils Absolute: 0.1 10*3/uL (ref 0.0–0.2)
Basos: 1 %
EOS (ABSOLUTE): 0.1 10*3/uL (ref 0.0–0.4)
Eos: 1 %
Hematocrit: 35.2 % (ref 34.0–46.6)
Hemoglobin: 11.4 g/dL (ref 11.1–15.9)
Immature Grans (Abs): 0 10*3/uL (ref 0.0–0.1)
Immature Granulocytes: 0 %
Lymphocytes Absolute: 3.1 10*3/uL (ref 0.7–3.1)
Lymphs: 48 %
MCH: 30 pg (ref 26.6–33.0)
MCHC: 32.4 g/dL (ref 31.5–35.7)
MCV: 93 fL (ref 79–97)
Monocytes Absolute: 0.3 10*3/uL (ref 0.1–0.9)
Monocytes: 5 %
Neutrophils Absolute: 2.9 10*3/uL (ref 1.4–7.0)
Neutrophils: 45 %
Platelets: 277 10*3/uL (ref 150–450)
RBC: 3.8 x10E6/uL (ref 3.77–5.28)
RDW: 12.4 % (ref 11.7–15.4)
WBC: 6.5 10*3/uL (ref 3.4–10.8)

## 2019-12-17 LAB — T3: T3, Total: 113 ng/dL (ref 71–180)

## 2019-12-17 LAB — TSH: TSH: 2.1 u[IU]/mL (ref 0.450–4.500)

## 2019-12-17 LAB — T4, FREE: Free T4: 1.16 ng/dL (ref 0.82–1.77)

## 2019-12-17 LAB — INSULIN, RANDOM: INSULIN: 11.8 u[IU]/mL (ref 2.6–24.9)

## 2019-12-17 LAB — VITAMIN D 25 HYDROXY (VIT D DEFICIENCY, FRACTURES): Vit D, 25-Hydroxy: 12.7 ng/mL — ABNORMAL LOW (ref 30.0–100.0)

## 2019-12-22 ENCOUNTER — Ambulatory Visit (INDEPENDENT_AMBULATORY_CARE_PROVIDER_SITE_OTHER): Payer: Medicare Other | Admitting: Pulmonary Disease

## 2019-12-22 ENCOUNTER — Encounter: Payer: Self-pay | Admitting: Pulmonary Disease

## 2019-12-22 ENCOUNTER — Other Ambulatory Visit: Payer: Self-pay

## 2019-12-22 VITALS — BP 120/74 | HR 87 | Temp 97.1°F | Ht 64.0 in | Wt 268.6 lb

## 2019-12-22 DIAGNOSIS — G4733 Obstructive sleep apnea (adult) (pediatric): Secondary | ICD-10-CM | POA: Diagnosis not present

## 2019-12-22 NOTE — Progress Notes (Signed)
Joy [name]    [id number]    21-Jun-1966  Primary Care Physician:O'Sullivan, Lenna Sciara, NP  Referring Physician: Debbrah Alar, NP [address] [place],  [place] [address]  Chief complaint:   Patient being seen for possible obstructive sleep apnea  HPI:  History of snoring, witnessed apnea she is tired of all the time Nonrestorative sleep  Admits to dryness of her mouth Occasional headaches Her mom did snore  Usually goes to bed about 2 AM Wake up time about 8 AM No awakenings at night  Never smoker  Outpatient Encounter Medications as of 12/22/2019  Medication Sig  . albuterol (PROAIR HFA) 108 (90 Base) MCG/ACT inhaler Inhale 2 puffs into the lungs every 4 (four) hours as needed for wheezing or shortness of breath.  Marland Kitchen atorvastatin (LIPITOR) 20 MG tablet Take 1 tablet (20 mg total) by mouth daily.  . beclomethasone (QVAR) 40 MCG/ACT inhaler TWO PUFFS TWICE A DAY TO PREVENT COUGH OR WHEEZE. RINSE, GARGLE AND SPIT AFTER USE  . cetirizine (ZYRTEC ALLERGY) 10 MG tablet Take 1 tablet (10 mg total) by mouth daily.  . fluticasone (FLONASE) 50 MCG/ACT nasal spray Place 2 sprays into both nostrils daily.  . fluticasone (FLOVENT HFA) 44 MCG/ACT inhaler Inhale 2 puffs into the lungs once.  . folic acid (FOLVITE) 1 MG tablet Take 1 tablet (1 mg total) by mouth daily.  Marland Kitchen lisinopril (ZESTRIL) 10 MG tablet Take 1 tablet (10 mg total) by mouth daily.  . methotrexate (RHEUMATREX) 2.5 MG tablet Take 15 mg by mouth once a week. Caution:Chemotherapy. Protect from light.  . Multiple Vitamins-Minerals (WOMENS MULTIVITAMIN PO) Take by mouth.   No facility-administered encounter medications on file as of 12/22/2019.    Allergies as of 12/22/2019  . (No Known Allergies)    Past Medical History:  Diagnosis Date  . Asthma   . Derangement of medial meniscus   . Folic acid deficiency   . High cholesterol   . Hypertension   . Joint pain   . 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 Meningitis 2010   hospitalized  . Obesity   . Osteoarthritis 2015  . Pernicious anemia   . Rheumatoid arthritis ([place]) 2015  . Steatosis of liver     Past Surgical History:  Procedure Laterality Date  . ABDOMINAL HYSTERECTOMY  1994   partial  . BREAST SURGERY  2003   BIOPSY LEFT BREAST--BENIGN  . FOOT SURGERY Right   . KNEE SURGERY  2017  . TONSILLECTOMY      Family History  Problem Relation Age of Onset  . Stroke Mother   . Blindness Mother        in left eye due to stroke  . Glaucoma Mother        caused blindness of right eye  . Dementia Mother        died at 67  . Cancer Maternal Aunt 60       ovarian  . Cancer Maternal Grandmother 60       colon  . Diabetes Maternal Aunt   . Diabetes Maternal Aunt     Social History   Socioeconomic History  . Marital status: Divorced    Spouse name: [name]  . Number of children: 2  . Years of education: Not on file  . Highest education level: Not on file  Occupational History  . Occupation: TEFL teacher  Employer: [employer]  Tobacco Use  . Smoking status: Never Smoker  . Smokeless tobacco: Never Used  Substance and Sexual Activity  . Alcohol use: No  . Drug use: No  . Sexual activity: Not on file  Other Topics Concern  . Not on file  Social History Narrative   On disability   Previously worked at [employer]   One Son- 38 minutes away- he has 3 sons   One daughter- currently lives with pt   No pets   Single   Enjoys reading   Social Determinants of Radio broadcast assistant Strain:   . Difficulty of Paying Living Expenses:   Food Insecurity:   . Worried About Charity fundraiser in the Last Year:   . Arboriculturist in the Last Year:   Transportation Needs:   . Film/video editor (Medical):   Marland Kitchen Lack of Transportation (Non-Medical):   Physical Activity:   . Days of Exercise per Week:   . Minutes of Exercise per Session:   Stress:   .  Feeling of Stress :   Social Connections:   . Frequency of Communication with Friends and Family:   . Frequency of Social Gatherings with Friends and Family:   . Attends Religious Services:   . Active Member of Clubs or Organizations:   . Attends Archivist Meetings:   Marland Kitchen Marital Status:   Intimate Partner Violence:   . Fear of Current or Ex-Partner:   . Emotionally Abused:   Marland Kitchen Physically Abused:   . Sexually Abused:     Review of Systems  Respiratory: Positive for apnea.   Psychiatric/Behavioral: Positive for sleep disturbance.    Vitals:   12/22/19 1624  BP: 120/74  Pulse: 87  Temp: (!) 97.1 F (36.2 C)  SpO2: 98%     Physical Exam  Constitutional: She is oriented to person, place, and time. She appears well-developed.  Obese  HENT:  Head: Normocephalic and atraumatic.  Eyes: Pupils are equal, round, and reactive to light.  Neck: No tracheal deviation present. No thyromegaly present.  Cardiovascular: Normal rate and regular rhythm.  Pulmonary/Chest: Effort normal and breath sounds normal. No respiratory distress. She has no wheezes. She has no rales. She exhibits no tenderness.  Musculoskeletal:        General: No edema. Normal range of motion.  Neurological: She is alert and oriented to person, place, and time.  Psychiatric: She has a normal mood and affect.   Results of the Epworth flowsheet 12/22/2019  Sitting and reading 3  Watching TV 3  Sitting, inactive in a public place (e.g. a theatre or a meeting) 2  As a passenger in a car for an hour without a break 3  Lying down to rest in the afternoon when circumstances permit 3  Sitting and talking to someone 0  Sitting quietly after a lunch without alcohol 2  In a car, while stopped for a few minutes in traffic 0  Total score 16    Assessment:  Moderate probability of significant obstructive sleep apnea  Obesity  Excessive daytime sleepiness  Pathophysiology of sleep disordered breathing  discussed with the patient Treatment options for sleep disordered breathing discussed with the patient  Plan/Recommendations: We will schedule the patient for home sleep study  Risk of not treating obstructive sleep apnea discussed  Follow-up in 3 months  Weight loss efforts encouraged   Sherrilyn Rist MD [facility] 12/22/2019, 5:01 PM  CC: Debbrah Alar, NP

## 2019-12-22 NOTE — Patient Instructions (Signed)
Moderate probability of significant obstructive sleep apnea  We will obtain a home sleep study Treatment options as discussed We will follow-up with you in about 2 to 3 months  Call with significant concerns Sleep Apnea Sleep apnea is a condition in which breathing pauses or becomes shallow during sleep. Episodes of sleep apnea usually last 10 seconds or longer, and they may occur as many as 20 times an hour. Sleep apnea disrupts your sleep and keeps your body from getting the rest that it needs. This condition can increase your risk of certain health problems, including:  Heart attack.  Stroke.  Obesity.  Diabetes.  Heart failure.  Irregular heartbeat. What are the causes? There are three kinds of sleep apnea:  Obstructive sleep apnea. This kind is caused by a blocked or collapsed airway.  Central sleep apnea. This kind happens when the part of the brain that controls breathing does not send the correct signals to the muscles that control breathing.  Mixed sleep apnea. This is a combination of obstructive and central sleep apnea. The most common cause of this condition is a collapsed or blocked airway. An airway can collapse or become blocked if:  Your throat muscles are abnormally relaxed.  Your tongue and tonsils are larger than normal.  You are overweight.  Your airway is smaller than normal. What increases the risk? You are more likely to develop this condition if you:  Are overweight.  Smoke.  Have a smaller than normal airway.  Are elderly.  Are female.  Drink alcohol.  Take sedatives or tranquilizers.  Have a family history of sleep apnea. What are the signs or symptoms? Symptoms of this condition include:  Trouble staying asleep.  Daytime sleepiness and tiredness.  Irritability.  Loud snoring.  Morning headaches.  Trouble concentrating.  Forgetfulness.  Decreased interest in sex.  Unexplained sleepiness.  Mood swings.  Personality  changes.  Feelings of depression.  Waking up often during the night to urinate.  Dry mouth.  Sore throat. How is this diagnosed? This condition may be diagnosed with:  A medical history.  A physical exam.  A series of tests that are done while you are sleeping (sleep study). These tests are usually done in a sleep lab, but they may also be done at home. How is this treated? Treatment for this condition aims to restore normal breathing and to ease symptoms during sleep. It may involve managing health issues that can affect breathing, such as high blood pressure or obesity. Treatment may include:  Sleeping on your side.  Using a decongestant if you have nasal congestion.  Avoiding the use of depressants, including alcohol, sedatives, and narcotics.  Losing weight if you are overweight.  Making changes to your diet.  Quitting smoking.  Using a device to open your airway while you sleep, such as: ? An oral appliance. This is a custom-made mouthpiece that shifts your lower jaw forward. ? A continuous positive airway pressure (CPAP) device. This device blows air through a mask when you breathe out (exhale). ? A nasal expiratory positive airway pressure (EPAP) device. This device has valves that you put into each nostril. ? A bi-level positive airway pressure (BPAP) device. This device blows air through a mask when you breathe in (inhale) and breathe out (exhale).  Having surgery if other treatments do not work. During surgery, excess tissue is removed to create a wider airway. It is important to get treatment for sleep apnea. Without treatment, this condition can lead to:  High blood pressure.  Coronary artery disease.  In men, an inability to achieve or maintain an erection (impotence).  Reduced thinking abilities. Follow these instructions at home: Lifestyle  Make any lifestyle changes that your health care provider recommends.  Eat a healthy, well-balanced  diet.  Take steps to lose weight if you are overweight.  Avoid using depressants, including alcohol, sedatives, and narcotics.  Do not use any products that contain nicotine or tobacco, such as cigarettes, e-cigarettes, and chewing tobacco. If you need help quitting, ask your health care provider. General instructions  Take over-the-counter and prescription medicines only as told by your health care provider.  If you were given a device to open your airway while you sleep, use it only as told by your health care provider.  If you are having surgery, make sure to tell your health care provider you have sleep apnea. You may need to bring your device with you.  Keep all follow-up visits as told by your health care provider. This is important. Contact a health care provider if:  The device that you received to open your airway during sleep is uncomfortable or does not seem to be working.  Your symptoms do not improve.  Your symptoms get worse. Get help right away if:  You develop: ? Chest pain. ? Shortness of breath. ? Discomfort in your back, arms, or stomach.  You have: ? Trouble speaking. ? Weakness on one side of your body. ? Drooping in your face. These symptoms may represent a serious problem that is an emergency. Do not wait to see if the symptoms will go away. Get medical help right away. Call your local emergency services (911 in the [place] Do not drive yourself to the hospital. Summary  Sleep apnea is a condition in which breathing pauses or becomes shallow during sleep.  The most common cause is a collapsed or blocked airway.  The goal of treatment is to restore normal breathing and to ease symptoms during sleep. This information is not intended to replace advice given to you by your health care provider. Make sure you discuss any questions you have with your health care provider. Document Revised: 01/15/2019 Document Reviewed: 03/26/2018 Elsevier Patient Education   [place].

## 2019-12-23 ENCOUNTER — Other Ambulatory Visit: Payer: Self-pay | Admitting: Family

## 2019-12-25 ENCOUNTER — Other Ambulatory Visit: Payer: Self-pay | Admitting: Family

## 2019-12-25 MED ORDER — ATORVASTATIN CALCIUM 20 MG PO TABS: 20.0000 mg | ORAL_TABLET | Freq: Every day | ORAL | 0 refills | Status: DC

## 2019-12-30 ENCOUNTER — Ambulatory Visit (INDEPENDENT_AMBULATORY_CARE_PROVIDER_SITE_OTHER): Payer: Medicare Other | Admitting: Family Medicine

## 2019-12-30 ENCOUNTER — Other Ambulatory Visit: Payer: Self-pay

## 2019-12-31 ENCOUNTER — Telehealth: Payer: Self-pay | Admitting: Family

## 2019-12-31 DIAGNOSIS — Z23 Encounter for immunization: Secondary | ICD-10-CM

## 2019-12-31 NOTE — Telephone Encounter (Signed)
Caller: Artha  Call Back # [phone]   Pt is requesting that provider place order for TDAP and SHINGLES Vaccines.  Please Advise

## 2020-01-01 NOTE — Telephone Encounter (Signed)
Please advise if ok to set up nurse visit for shingrix and tdap

## 2020-01-01 NOTE — Telephone Encounter (Signed)
Yes please

## 2020-01-01 NOTE — Telephone Encounter (Signed)
Patient scheduled for injections on 01-06-20.

## 2020-01-05 ENCOUNTER — Other Ambulatory Visit: Payer: Self-pay

## 2020-01-05 ENCOUNTER — Ambulatory Visit: Payer: Medicare Other

## 2020-01-05 DIAGNOSIS — G4733 Obstructive sleep apnea (adult) (pediatric): Secondary | ICD-10-CM

## 2020-01-05 DIAGNOSIS — R0683 Snoring: Secondary | ICD-10-CM | POA: Diagnosis not present

## 2020-01-06 ENCOUNTER — Ambulatory Visit (INDEPENDENT_AMBULATORY_CARE_PROVIDER_SITE_OTHER): Payer: Medicare Other

## 2020-01-06 ENCOUNTER — Other Ambulatory Visit: Payer: Self-pay

## 2020-01-06 DIAGNOSIS — Z23 Encounter for immunization: Secondary | ICD-10-CM | POA: Diagnosis not present

## 2020-01-06 NOTE — Progress Notes (Signed)
Patient here today for first Shingles vaccine and Tdap vaccine. Both were given an ok by her provider. The patient tolerated both injections well. Was instructed to schedule nurse visit for 2nd shingles vaccine within 2 to 6 months from first. The patient verbalized understanding.

## 2020-01-08 ENCOUNTER — Ambulatory Visit (INDEPENDENT_AMBULATORY_CARE_PROVIDER_SITE_OTHER): Payer: Medicare Other | Admitting: Bariatrics

## 2020-01-08 ENCOUNTER — Other Ambulatory Visit: Payer: Self-pay

## 2020-01-08 ENCOUNTER — Encounter (INDEPENDENT_AMBULATORY_CARE_PROVIDER_SITE_OTHER): Payer: Self-pay | Admitting: Bariatrics

## 2020-01-08 VITALS — BP 104/71 | HR 79 | Temp 98.1°F | Ht 64.0 in | Wt 270.0 lb

## 2020-01-08 DIAGNOSIS — E88819 Insulin resistance, unspecified: Secondary | ICD-10-CM

## 2020-01-08 DIAGNOSIS — Z6841 Body Mass Index (BMI) 40.0 and over, adult: Secondary | ICD-10-CM

## 2020-01-08 DIAGNOSIS — E8881 Metabolic syndrome: Secondary | ICD-10-CM

## 2020-01-08 DIAGNOSIS — E559 Vitamin D deficiency, unspecified: Secondary | ICD-10-CM | POA: Diagnosis not present

## 2020-01-08 DIAGNOSIS — Z9189 Other specified personal risk factors, not elsewhere classified: Secondary | ICD-10-CM

## 2020-01-08 MED ORDER — VITAMIN D (ERGOCALCIFEROL) 1.25 MG (50000 UNIT) PO CAPS: 50000.0000 [IU] | ORAL_CAPSULE | ORAL | 0 refills | Status: DC

## 2020-01-08 NOTE — Progress Notes (Signed)
Chief Complaint:   Joy [name] is here to discuss her progress with her obesity treatment plan along with follow-up of her obesity related diagnoses. Novi is on the Category 1 Plan and states she is following her eating plan approximately 100% of the time. Kobi states she is walking/yoga 30 minutes 1-2 times per week.  Today's visit was #: 2 Starting weight: 268 lbs Starting date: 12/16/2019 Today's weight: 270 lbs Today's date: 01/08/2020 Total lbs lost to date: 0 Total lbs lost since last in-office visit: 0  Interim History: Joy [name] is up 2 lbs. She states that the plan is easy to follow. She reports drinking 48 oz of water daily.  Subjective:   Vitamin D deficiency. Joy [name] takes a multivitamin for supplementation. Last Vitamin D 12.7 on 12/16/2019.  Insulin resistance. Joy [name] has a diagnosis of insulin resistance based on her elevated fasting insulin level >5. She continues to work on diet and exercise to decrease her risk of diabetes.  Lab Results  Component Value Date   INSULIN 11.8 12/16/2019   Lab Results  Component Value Date   HGBA1C 5.6 12/16/2019   At risk for osteoporosis. Joy [name] is at higher risk of osteopenia and osteoporosis due to Vitamin D deficiency.   Assessment/Plan:   Vitamin D deficiency. Low Vitamin D level contributes to fatigue and are associated with obesity, breast, and colon cancer. She was given a prescription for Vitamin D, Ergocalciferol, (DRISDOL) 1.25 MG (50000 UNIT) CAPS capsule every week #4 with 0 refills and will follow-up for routine testing of Vitamin D, at least 2-3 times per year to avoid over-replacement.   Insulin resistance. Joy [name] will continue to work on weight loss, exercise, increasing healthy fats and protein, and decreasing simple carbohydrates to help decrease the risk of diabetes. Joy [name] agreed to follow-up with [name] as directed to closely monitor her progress.  At risk for  osteoporosis. Joy [name] was given approximately 15 minutes of osteoporosis prevention counseling today. Joy [name] is at risk for osteopenia and osteoporosis due to her Vitamin D deficiency. She was encouraged to take her Vitamin D and follow her higher calcium diet and increase strengthening exercise to help strengthen her bones and decrease her risk of osteopenia and osteoporosis.  Repetitive spaced learning was employed today to elicit superior memory formation and behavioral change.  Class 3 severe obesity with serious comorbidity and body mass index (BMI) of 45.0 to 49.9 in adult, unspecified obesity type ([place]).  Joy [name] is currently in the action stage of change. As such, her goal is to continue with weight loss efforts. She has agreed to the Category 1 Plan.   She will work on meal planning and increasing her water and protein intake.  Exercise goals: Joy [name] will do slight walking and some classes at the [facility].   Behavioral modification strategies: increasing lean protein intake, decreasing simple carbohydrates, increasing vegetables, increasing water intake, decreasing eating out, no skipping meals, meal planning and cooking strategies, keeping healthy foods in the home and planning for success.  Joy [name] has agreed to follow-up with our clinic in 3-4 weeks. She was informed of the importance of frequent follow-up visits to maximize her success with intensive lifestyle modifications for her multiple health conditions.   Objective:   Blood pressure 104/71, pulse 79, temperature 98.1 F (36.7 C), temperature source Oral, height 5\' 4"  (1.626 m), weight 270 lb (122.5 kg), SpO2 98 %. Body mass index is 46.35 kg/m.  General: Cooperative, alert, well developed, in no acute distress.  HEENT: Conjunctivae and lids unremarkable. Cardiovascular: Regular rhythm.  Lungs: Normal work of breathing. Neurologic: No focal deficits.   Lab Results  Component Value Date   CREATININE 1.05  11/25/2019   BUN 12 11/25/2019   NA 139 11/25/2019   K 4.4 11/25/2019   CL 102 11/25/2019   CO2 31 11/25/2019   Lab Results  Component Value Date   ALT 16 11/25/2019   AST 19 11/25/2019   ALKPHOS 104 11/25/2019   BILITOT 0.6 11/25/2019   Lab Results  Component Value Date   HGBA1C 5.6 12/16/2019   Lab Results  Component Value Date   INSULIN 11.8 12/16/2019   Lab Results  Component Value Date   TSH 2.100 12/16/2019   Lab Results  Component Value Date   CHOL 214 (H) 11/25/2019   HDL 41.80 11/25/2019   LDLCALC 156 (H) 11/25/2019   TRIG 82.0 11/25/2019   CHOLHDL 5 11/25/2019   Lab Results  Component Value Date   WBC 6.5 12/16/2019   HGB 11.4 12/16/2019   HCT 35.2 12/16/2019   MCV 93 12/16/2019   PLT 277 12/16/2019   Lab Results  Component Value Date   IRON 46 11/25/2019   TIBC 252 09/26/2013   FERRITIN 242.6 11/25/2019   Attestation Statements:   Reviewed by clinician on day of visit: allergies, medications, problem list, medical history, surgical history, family history, social history, and previous encounter notes.  Migdalia Dk, am acting as Location manager for [employer], DO   I have reviewed the above documentation for accuracy and completeness, and I agree with the above. Jearld Lesch, DO

## 2020-01-13 ENCOUNTER — Telehealth: Payer: Self-pay | Admitting: Pulmonary Disease

## 2020-01-13 DIAGNOSIS — R0683 Snoring: Secondary | ICD-10-CM | POA: Diagnosis not present

## 2020-01-13 NOTE — Telephone Encounter (Signed)
Call patient  Sleep study result  Date of study: 01/05/2020  Impression: Negative for significant sleep disordered breathing  Recommendation: Sleep position optimization by encouraging sleeping in a lateral position, elevating head of bed by about 30 degrees may help noted snoring  Encouraged weight loss and exercise  Caution against sleepy driving  Follow-up in 4 to 6 weeks

## 2020-01-14 NOTE — Telephone Encounter (Signed)
Patient notified of negative home sleep study for obstructive sleep apnea. Patient provided with Dr. Judson Roch recommendations listed below. Patient verbalized understanding of results and plan to have follow up visit, recall placed for 2 months.

## 2020-01-22 DIAGNOSIS — J309 Allergic rhinitis, unspecified: Secondary | ICD-10-CM | POA: Diagnosis not present

## 2020-01-22 DIAGNOSIS — H1013 Acute atopic conjunctivitis, bilateral: Secondary | ICD-10-CM | POA: Diagnosis not present

## 2020-01-22 DIAGNOSIS — J4520 Mild intermittent asthma, uncomplicated: Secondary | ICD-10-CM | POA: Diagnosis not present

## 2020-01-29 ENCOUNTER — Ambulatory Visit (INDEPENDENT_AMBULATORY_CARE_PROVIDER_SITE_OTHER): Payer: Medicare Other | Admitting: Family Medicine

## 2020-01-29 ENCOUNTER — Encounter (INDEPENDENT_AMBULATORY_CARE_PROVIDER_SITE_OTHER): Payer: Self-pay | Admitting: Family Medicine

## 2020-01-29 ENCOUNTER — Other Ambulatory Visit: Payer: Self-pay

## 2020-01-29 VITALS — BP 104/72 | HR 68 | Temp 98.0°F | Ht 64.0 in | Wt 273.0 lb

## 2020-01-29 DIAGNOSIS — E559 Vitamin D deficiency, unspecified: Secondary | ICD-10-CM

## 2020-01-29 DIAGNOSIS — M0609 Rheumatoid arthritis without rheumatoid factor, multiple sites: Secondary | ICD-10-CM | POA: Diagnosis not present

## 2020-01-29 DIAGNOSIS — Z6841 Body Mass Index (BMI) 40.0 and over, adult: Secondary | ICD-10-CM | POA: Diagnosis not present

## 2020-01-29 DIAGNOSIS — I10 Essential (primary) hypertension: Secondary | ICD-10-CM | POA: Diagnosis not present

## 2020-01-29 MED ORDER — VITAMIN D (ERGOCALCIFEROL) 1.25 MG (50000 UNIT) PO CAPS: 50000.0000 [IU] | ORAL_CAPSULE | ORAL | 0 refills | Status: DC

## 2020-02-03 ENCOUNTER — Encounter (INDEPENDENT_AMBULATORY_CARE_PROVIDER_SITE_OTHER): Payer: Self-pay | Admitting: Family Medicine

## 2020-02-03 DIAGNOSIS — E559 Vitamin D deficiency, unspecified: Secondary | ICD-10-CM | POA: Insufficient documentation

## 2020-02-03 NOTE — Progress Notes (Signed)
Chief Complaint:   OBESITY Joy [name] is here to discuss her progress with her obesity treatment plan along with follow-up of her obesity related diagnoses. Joy [name] is on the Category 1 Plan and states she is following her eating plan approximately 100% of the time. Joy [name] states she is doing chair yoga and riding the stationary bike for 15-30 minutes 3-7 times per week.  Today's visit was #: 3 Starting weight: 268 lbs Starting date: 12/16/2019 Today's weight: 273 lbs Today's date: 01/29/2020 Total lbs lost to date: 0 Total lbs lost since last in-office visit: 0  Interim History: Joy [name] was previously skipped meals quite often. She generally has no hunger. She only drinks water. Food recall:  Breakfast is 2 boiled eggs and 1 piece of toast.  Lunch is [place] sandwich with peach.  Supper is baked fish or chicken 4 oz, and salad with dressing.  Snack is granola bar.   Subjective:   1. Vitamin D deficiency Joy [name] notes fatigue. Last vit D level was very low at 12.7. She is on prescription Vit D.  2. Essential hypertension Joy [name]'s blood pressure is well controlled with lisinopril only. She denies chest pain or shortness of breath.  BP Readings from Last 3 Encounters:  01/29/20 104/72  01/08/20 104/71  12/22/19 120/74   Lab Results  Component Value Date   CREATININE 1.05 11/25/2019   CREATININE 1.10 (H) 08/14/2016   CREATININE 0.95 11/17/2014   Assessment/Plan:   1. Vitamin D deficiency Low Vitamin D level contributes to fatigue and are associated with obesity, breast, and colon cancer. We will refill prescription Vitamin D for 1 month. [name] will follow-up for routine testing of Vitamin D, at least 2-3 times per year to avoid over-replacement.  - Vitamin D, Ergocalciferol, (DRISDOL) 1.25 MG (50000 UNIT) CAPS capsule; Take 1 capsule (50,000 Units total) by mouth every 7 (seven) days.  Dispense: 4 capsule; Refill: 0  2. Essential hypertension Joy [name]  will continue lisinopril, and will continue working on healthy weight loss and exercise to improve blood pressure control. We will watch for signs of hypotension as she continues her lifestyle modifications.  3. Class 3 severe obesity with serious comorbidity and body mass index (BMI) of 45.0 to 49.9 in adult, unspecified obesity type (HCC) Joy [name] is currently in the action stage of change. As such, her goal is to continue with weight loss efforts. She has agreed to the Category 1 Plan.   Exercise goals: As is.  Behavioral modification strategies: increasing lean protein intake and meal planning and cooking strategies.  Joy [name] has agreed to follow-up with our clinic in 2 weeks. She was informed of the importance of frequent follow-up visits to maximize her success with intensive lifestyle modifications for her multiple health conditions.   Objective:   Blood pressure 104/72, pulse 68, temperature 98 F (36.7 C), temperature source Oral, height 5\' 4"  (1.626 m), weight 273 lb (123.8 kg), SpO2 100 %. Body mass index is 46.86 kg/m.  General: Cooperative, alert, well developed, in no acute distress. HEENT: Conjunctivae and lids unremarkable. Cardiovascular: Regular rhythm.  Lungs: Normal work of breathing. Neurologic: No focal deficits.   Lab Results  Component Value Date   CREATININE 1.05 11/25/2019   BUN 12 11/25/2019   NA 139 11/25/2019   K 4.4 11/25/2019   CL 102 11/25/2019   CO2 31 11/25/2019   Lab Results  Component Value Date   ALT 16 11/25/2019   AST 19 11/25/2019   ALKPHOS 104 11/25/2019  BILITOT 0.6 11/25/2019   Lab Results  Component Value Date   HGBA1C 5.6 12/16/2019   Lab Results  Component Value Date   INSULIN 11.8 12/16/2019   Lab Results  Component Value Date   TSH 2.100 12/16/2019   Lab Results  Component Value Date   CHOL 214 (H) 11/25/2019   HDL 41.80 11/25/2019   LDLCALC 156 (H) 11/25/2019   TRIG 82.0 11/25/2019   CHOLHDL 5 11/25/2019    Lab Results  Component Value Date   WBC 6.5 12/16/2019   HGB 11.4 12/16/2019   HCT 35.2 12/16/2019   MCV 93 12/16/2019   PLT 277 12/16/2019   Lab Results  Component Value Date   IRON 46 11/25/2019   TIBC 252 09/26/2013   FERRITIN 242.6 11/25/2019    Obesity Behavioral Intervention Documentation for Insurance:   Approximately 15 minutes were spent on the discussion below.  ASK: We discussed the diagnosis of obesity with Joy [name] today and Joy [name] agreed to give Joy [name] permission to discuss obesity behavioral modification therapy today.  ASSESS: [name] has the diagnosis of obesity and her BMI today is 46.84. [name] is in the action stage of change.   ADVISE: Joy [name] was educated on the multiple health risks of obesity as well as the benefit of weight loss to improve her health. She was advised of the need for long term treatment and the importance of lifestyle modifications to improve her current health and to decrease her risk of future health problems.  AGREE: Multiple dietary modification options and treatment options were discussed and Joy [name] agreed to follow the recommendations documented in the above note.  ARRANGE: Joy [name] was educated on the importance of frequent visits to treat obesity as outlined per CMS and USPSTF guidelines and agreed to schedule her next follow up appointment today.  Attestation Statements:   Reviewed by clinician on day of visit: allergies, medications, problem list, medical history, surgical history, family history, social history, and previous encounter notes.   Joy [name], am acting as Location manager for [employer], FNP-C.  I have reviewed the above documentation for accuracy and completeness, and I agree with the above. -  Joy Fick, FNP

## 2020-02-09 DIAGNOSIS — Z79899 Other long term (current) drug therapy: Secondary | ICD-10-CM | POA: Diagnosis not present

## 2020-02-09 DIAGNOSIS — M0609 Rheumatoid arthritis without rheumatoid factor, multiple sites: Secondary | ICD-10-CM | POA: Diagnosis not present

## 2020-02-10 DIAGNOSIS — Z79899 Other long term (current) drug therapy: Secondary | ICD-10-CM | POA: Diagnosis not present

## 2020-02-12 ENCOUNTER — Other Ambulatory Visit: Payer: Self-pay

## 2020-02-12 ENCOUNTER — Ambulatory Visit (INDEPENDENT_AMBULATORY_CARE_PROVIDER_SITE_OTHER): Payer: Medicare Other | Admitting: Bariatrics

## 2020-02-12 VITALS — BP 109/76 | HR 78 | Temp 97.9°F | Ht 64.0 in | Wt 267.0 lb

## 2020-02-12 DIAGNOSIS — Z6841 Body Mass Index (BMI) 40.0 and over, adult: Secondary | ICD-10-CM | POA: Diagnosis not present

## 2020-02-12 DIAGNOSIS — I10 Essential (primary) hypertension: Secondary | ICD-10-CM

## 2020-02-12 DIAGNOSIS — E7849 Other hyperlipidemia: Secondary | ICD-10-CM

## 2020-02-17 ENCOUNTER — Encounter (INDEPENDENT_AMBULATORY_CARE_PROVIDER_SITE_OTHER): Payer: Self-pay | Admitting: Bariatrics

## 2020-02-17 NOTE — Progress Notes (Signed)
Chief Complaint:   Joy [name] is here to discuss her progress with her obesity treatment plan along with follow-up of her obesity related diagnoses. Joy [name] is on the Category 1 Plan and states she is following her eating plan approximately 100% of the time. Joy [name] states she is doing chair yoga/biking 30-45 minutes 4 times per week.  Today's visit was #: 4 Starting weight: 268 lbs Starting date: 12/16/2019 Today's weight: 267 lbs Today's date: 02/12/2020 Total lbs lost to date: 1 Total lbs lost since last in-office visit: 6  Interim History: Joy [name] is down an additional 6 lbs and doing well overall. She reports having had more energy.  Subjective:   Other hyperlipidemia. Joy [name] is taking Lipitor.   Lab Results  Component Value Date   CHOL 214 (H) 11/25/2019   HDL 41.80 11/25/2019   LDLCALC 156 (H) 11/25/2019   TRIG 82.0 11/25/2019   CHOLHDL 5 11/25/2019   Lab Results  Component Value Date   ALT 16 11/25/2019   AST 19 11/25/2019   ALKPHOS 104 11/25/2019   BILITOT 0.6 11/25/2019   The 10-year ASCVD risk score Mikey Bussing DC Jr., et al., 2013) is: 3.7%   Values used to calculate the score:     Age: 54 years     Sex: Female     Is Non-Hispanic African American: Yes     Diabetic: No     Tobacco smoker: No     Systolic Blood Pressure: 174 mmHg     Is BP treated: Yes     HDL Cholesterol: 41.8 mg/dL     Total Cholesterol: 214 mg/dL  Essential hypertension. Joy [name] is taking Zestril.  BP Readings from Last 3 Encounters:  02/12/20 109/76  01/29/20 104/72  01/08/20 104/71   Lab Results  Component Value Date   CREATININE 1.05 11/25/2019   CREATININE 1.10 (H) 08/14/2016   CREATININE 0.95 11/17/2014   Assessment/Plan:   Other hyperlipidemia. Cardiovascular risk and specific lipid/LDL goals reviewed.  We discussed several lifestyle modifications today and Cassy will continue to work on diet, exercise and weight loss efforts. Orders and  follow up as documented in patient record. She will continue her medication as directed.   Counseling Intensive lifestyle modifications are the first line treatment for this issue. . Dietary changes: Increase soluble fiber. Decrease simple carbohydrates. . Exercise changes: Moderate to vigorous-intensity aerobic activity 150 minutes per week if tolerated. . Lipid-lowering medications: see documented in medical record.  Essential hypertension. Joy [name] is working on healthy weight loss and exercise to improve blood pressure control. We will watch for signs of hypotension as she continues her lifestyle modifications. She will continue her medication as directed.   Class 3 severe obesity with serious comorbidity and body mass index (BMI) of 45.0 to 49.9 in adult, unspecified obesity type ([place]).  Joy [name] is currently in the action stage of change. As such, her goal is to continue with weight loss efforts. She has agreed to the Category 1 Plan.   She will work on meal planning, intentional eating, and will be careful to follow the plan.  Exercise goals: Jetty will remain active.  Behavioral modification strategies: increasing lean protein intake, decreasing simple carbohydrates, increasing vegetables, increasing water intake, decreasing eating out, no skipping meals, meal planning and cooking strategies and keeping healthy foods in the home.  Joy [name] has agreed to follow-up with our clinic in 2 weeks. She was informed of the importance of frequent follow-up visits to maximize her success with  intensive lifestyle modifications for her multiple health conditions.   Objective:   Blood pressure 109/76, pulse 78, temperature 97.9 F (36.6 C), height 5\' 4"  (1.626 m), weight 267 lb (121.1 kg), SpO2 99 %. Body mass index is 45.83 kg/m.  General: Cooperative, alert, well developed, in no acute distress. HEENT: Conjunctivae and lids unremarkable. Cardiovascular: Regular rhythm.  Lungs:  Normal work of breathing. Neurologic: No focal deficits.   Lab Results  Component Value Date   CREATININE 1.05 11/25/2019   BUN 12 11/25/2019   NA 139 11/25/2019   K 4.4 11/25/2019   CL 102 11/25/2019   CO2 31 11/25/2019   Lab Results  Component Value Date   ALT 16 11/25/2019   AST 19 11/25/2019   ALKPHOS 104 11/25/2019   BILITOT 0.6 11/25/2019   Lab Results  Component Value Date   HGBA1C 5.6 12/16/2019   Lab Results  Component Value Date   INSULIN 11.8 12/16/2019   Lab Results  Component Value Date   TSH 2.100 12/16/2019   Lab Results  Component Value Date   CHOL 214 (H) 11/25/2019   HDL 41.80 11/25/2019   LDLCALC 156 (H) 11/25/2019   TRIG 82.0 11/25/2019   CHOLHDL 5 11/25/2019   Lab Results  Component Value Date   WBC 6.5 12/16/2019   HGB 11.4 12/16/2019   HCT 35.2 12/16/2019   MCV 93 12/16/2019   PLT 277 12/16/2019   Lab Results  Component Value Date   IRON 46 11/25/2019   TIBC 252 09/26/2013   FERRITIN 242.6 11/25/2019   Attestation Statements:   Reviewed by clinician on day of visit: allergies, medications, problem list, medical history, surgical history, family history, social history, and previous encounter notes.  Time spent on visit including pre-visit chart review and post-visit charting and care was 20 minutes.   Migdalia Dk, am acting as Location manager for [employer], DO   I have reviewed the above documentation for accuracy and completeness, and I agree with the above. Jearld Lesch, DO

## 2020-02-26 ENCOUNTER — Other Ambulatory Visit (INDEPENDENT_AMBULATORY_CARE_PROVIDER_SITE_OTHER): Payer: Self-pay | Admitting: Family Medicine

## 2020-02-26 ENCOUNTER — Encounter (INDEPENDENT_AMBULATORY_CARE_PROVIDER_SITE_OTHER): Payer: Self-pay | Admitting: Family Medicine

## 2020-02-26 ENCOUNTER — Ambulatory Visit (INDEPENDENT_AMBULATORY_CARE_PROVIDER_SITE_OTHER): Payer: Medicare Other | Admitting: Family Medicine

## 2020-02-26 ENCOUNTER — Other Ambulatory Visit: Payer: Self-pay

## 2020-02-26 VITALS — BP 100/68 | HR 82 | Temp 98.2°F | Ht 64.0 in | Wt 274.0 lb

## 2020-02-26 DIAGNOSIS — Z6841 Body Mass Index (BMI) 40.0 and over, adult: Secondary | ICD-10-CM | POA: Diagnosis not present

## 2020-02-26 DIAGNOSIS — I10 Essential (primary) hypertension: Secondary | ICD-10-CM | POA: Diagnosis not present

## 2020-02-26 DIAGNOSIS — E8881 Metabolic syndrome: Secondary | ICD-10-CM

## 2020-02-26 MED ORDER — LISINOPRIL 5 MG PO TABS: 5.0000 mg | ORAL_TABLET | Freq: Every day | ORAL | 0 refills | Status: DC

## 2020-02-28 ENCOUNTER — Other Ambulatory Visit (INDEPENDENT_AMBULATORY_CARE_PROVIDER_SITE_OTHER): Payer: Self-pay | Admitting: Family Medicine

## 2020-02-28 DIAGNOSIS — E559 Vitamin D deficiency, unspecified: Secondary | ICD-10-CM

## 2020-03-02 DIAGNOSIS — R3129 Other microscopic hematuria: Secondary | ICD-10-CM | POA: Diagnosis not present

## 2020-03-03 ENCOUNTER — Encounter (INDEPENDENT_AMBULATORY_CARE_PROVIDER_SITE_OTHER): Payer: Self-pay | Admitting: Family Medicine

## 2020-03-03 DIAGNOSIS — E8881 Metabolic syndrome: Secondary | ICD-10-CM | POA: Insufficient documentation

## 2020-03-03 DIAGNOSIS — E88819 Insulin resistance, unspecified: Secondary | ICD-10-CM | POA: Insufficient documentation

## 2020-03-03 NOTE — Progress Notes (Signed)
Chief Complaint:   OBESITY Joy [name] [name] here [name] discuss her progress with her obesity treatment plan along with [name] of her obesity related diagnoses. Joy [name] [name] on the Category 1 Plan and states she [name] following her eating plan approximately 75% of the time. Joy [name] states she [name] walking and doing chair aerobics for 45 minutes 4-5 times per week.  Today's visit was #: 5 Starting weight: 268 lbs Starting date: 12/16/2019 Today's weight: 274 lbs Today's date: 02/26/2020 Total lbs lost [name] date: 0 Total lbs lost since last in-office visit: 0  Interim History: Joy [name] just got back from vacation. She did not stick [name] the plan except for breakfast. She [name] up 7 lbs today. She [name] up >2 lbs of water per our bioimpedance scale.  Subjective:   1. Essential hypertension Joy [name]'s blood pressure [name] low today at 100/68. She feels sluggish and fatigued, but she denies dizziness. She [name] on lisinopril 10 mg.  BP Readings from Last 3 Encounters:  02/26/20 100/68  02/12/20 109/76  01/29/20 104/72   Lab Results  Component Value Date   CREATININE 1.05 11/25/2019   CREATININE 1.10 (H) 08/14/2016   CREATININE 0.95 11/17/2014   2. Insulin resistance Joy [name] [name] a diagnosis of insulin resistance based on her elevated fasting insulin level >5. She denies polyphagia, and she [name] not on metformin. She continues [name] work on diet and exercise [name] decrease her risk of diabetes.  Lab Results  Component Value Date   INSULIN 11.8 12/16/2019   Lab Results  Component Value Date   HGBA1C 5.6 12/16/2019   Assessment/Plan:   1. Essential hypertension Joy [name] [name] working on healthy weight loss and exercise [name] improve blood pressure control. We will watch for signs of hypotension as she continues her lifestyle modifications. Joy [name] [name] [name] decrease lisinopril [name] 5 mg and we will refill for 1 month.  - lisinopril (ZESTRIL) 5 MG tablet; Take 1 tablet (5 mg total) by mouth daily.   Dispense: 30 tablet; Refill: 0  2. Insulin resistance Joy [name] will continue her meal plan, and will continue [name] work on weight loss, exercise, and decreasing simple carbohydrates [name] help decrease the risk of diabetes. Joy [name] [name] [name] [name] with [name] as directed [name] closely monitor her progress.  3. Class 3 severe obesity with serious comorbidity and body mass index (BMI) of 45.0 [name] 49.9 in adult, unspecified obesity type (HCC) Joy [name] currently in the action stage of change. As such, her goal [name] [name] continue with weight loss efforts. She [name] [name] [name] following a lower carbohydrate, vegetable and lean protein rich diet plan.   Exercise goals: As [name].  Behavioral modification strategies: increasing lean protein intake and decreasing simple carbohydrates.  Joy [name]. She was [name] of the importance of frequent [name] visits [name] maximize her success with intensive lifestyle modifications for her multiple health conditions.   Objective:   Blood pressure 100/68, pulse 82, temperature 98.2 F (36.8 C), temperature source Oral, height 5\' 4"  (1.626 m), weight 274 lb (124.3 kg), SpO2 100 %. Body mass index [name] 47.03 kg/m.  General: Cooperative, alert, well developed, in no acute distress. HEENT: Conjunctivae and lids unremarkable. Cardiovascular: Regular rhythm.  Lungs: Normal work of breathing. Neurologic: No focal deficits.   Lab Results  Component Value Date   CREATININE 1.05 11/25/2019   BUN 12 11/25/2019   NA 139 11/25/2019   K 4.4 11/25/2019   CL 102 11/25/2019  CO2 31 11/25/2019   Lab Results  Component Value Date   ALT 16 11/25/2019   AST 19 11/25/2019   ALKPHOS 104 11/25/2019   BILITOT 0.6 11/25/2019   Lab Results  Component Value Date   HGBA1C 5.6 12/16/2019   Lab Results  Component Value Date   INSULIN 11.8 12/16/2019   Lab Results  Component Value Date   TSH 2.100 12/16/2019   Lab Results    Component Value Date   CHOL 214 (H) 11/25/2019   HDL 41.80 11/25/2019   LDLCALC 156 (H) 11/25/2019   TRIG 82.0 11/25/2019   CHOLHDL 5 11/25/2019   Lab Results  Component Value Date   WBC 6.5 12/16/2019   HGB 11.4 12/16/2019   HCT 35.2 12/16/2019   MCV 93 12/16/2019   PLT 277 12/16/2019   Lab Results  Component Value Date   IRON 46 11/25/2019   TIBC 252 09/26/2013   FERRITIN 242.6 11/25/2019    Obesity Behavioral Intervention Documentation for Insurance:   Approximately 15 minutes were spent on the discussion below.  ASK: We discussed the diagnosis of obesity with Joy [name] today and Joy [name] [name] [name] give [name] permission [name] discuss obesity behavioral modification therapy today.  ASSESS: Joy [name] [name] the diagnosis of obesity and her BMI today [name] 47.01. Joy [name] [name] in the action stage of change.   ADVISE: Joy [name] was educated on the multiple health risks of obesity as well as the benefit of weight loss [name] improve her health. She was advised of the need for long term treatment and the importance of lifestyle modifications [name] improve her current health and [name] decrease her risk of future health problems.  AGREE: Multiple dietary modification options and treatment options were discussed and Joy [name] [name] [name] follow the recommendations documented in the above note.  ARRANGE: Joy [name] was educated on the importance of frequent visits [name] treat obesity as outlined per CMS and USPSTF guidelines and [name] [name] schedule her next follow up appointment today.  Attestation Statements:   Reviewed by clinician on day of visit: allergies, medications, problem list, medical history, surgical history, family history, social history, and previous encounter notes.   Joy [name], am acting as Location manager for Joy [employer], FNP-C.  I have reviewed the above documentation for accuracy and completeness, and I agree with the above. -  Joy Fick, FNP

## 2020-03-09 ENCOUNTER — Other Ambulatory Visit: Payer: Self-pay

## 2020-03-09 ENCOUNTER — Ambulatory Visit (INDEPENDENT_AMBULATORY_CARE_PROVIDER_SITE_OTHER): Payer: Medicare Other

## 2020-03-09 DIAGNOSIS — Z23 Encounter for immunization: Secondary | ICD-10-CM

## 2020-03-09 DIAGNOSIS — N182 Chronic kidney disease, stage 2 (mild): Secondary | ICD-10-CM | POA: Diagnosis not present

## 2020-03-09 DIAGNOSIS — I129 Hypertensive chronic kidney disease with stage 1 through stage 4 chronic kidney disease, or unspecified chronic kidney disease: Secondary | ICD-10-CM | POA: Diagnosis not present

## 2020-03-09 DIAGNOSIS — R3129 Other microscopic hematuria: Secondary | ICD-10-CM | POA: Diagnosis not present

## 2020-03-09 DIAGNOSIS — M0609 Rheumatoid arthritis without rheumatoid factor, multiple sites: Secondary | ICD-10-CM | POA: Diagnosis not present

## 2020-03-09 NOTE — Progress Notes (Signed)
Patient in for Shingrix #2 vaccine, given in left deltoid. Tolerated well.   Joy [name]

## 2020-03-12 ENCOUNTER — Other Ambulatory Visit (INDEPENDENT_AMBULATORY_CARE_PROVIDER_SITE_OTHER): Payer: Self-pay | Admitting: Family Medicine

## 2020-03-12 DIAGNOSIS — E559 Vitamin D deficiency, unspecified: Secondary | ICD-10-CM

## 2020-03-22 ENCOUNTER — Other Ambulatory Visit (INDEPENDENT_AMBULATORY_CARE_PROVIDER_SITE_OTHER): Payer: Self-pay | Admitting: Family Medicine

## 2020-03-22 ENCOUNTER — Other Ambulatory Visit: Payer: Self-pay

## 2020-03-22 ENCOUNTER — Ambulatory Visit (INDEPENDENT_AMBULATORY_CARE_PROVIDER_SITE_OTHER): Payer: Medicare Other | Admitting: Family Medicine

## 2020-03-22 ENCOUNTER — Encounter (INDEPENDENT_AMBULATORY_CARE_PROVIDER_SITE_OTHER): Payer: Self-pay | Admitting: Family Medicine

## 2020-03-22 VITALS — BP 107/70 | HR 68 | Temp 98.1°F | Ht 64.0 in | Wt 273.0 lb

## 2020-03-22 DIAGNOSIS — I10 Essential (primary) hypertension: Secondary | ICD-10-CM

## 2020-03-22 DIAGNOSIS — E559 Vitamin D deficiency, unspecified: Secondary | ICD-10-CM

## 2020-03-22 DIAGNOSIS — Z6841 Body Mass Index (BMI) 40.0 and over, adult: Secondary | ICD-10-CM

## 2020-03-22 MED ORDER — VITAMIN D (ERGOCALCIFEROL) 1.25 MG (50000 UNIT) PO CAPS: 50000.0000 [IU] | ORAL_CAPSULE | ORAL | 0 refills | Status: DC

## 2020-03-22 NOTE — Progress Notes (Signed)
Chief Complaint:   OBESITY Joy [name] is here to discuss her progress with her obesity treatment plan along with follow-up of her obesity related diagnoses. Joy [name] is on following a lower carbohydrate, vegetable and lean protein rich diet plan and states she is following her eating plan approximately 100% of the time. Joy [name] states she is walking for 45 minutes 3 times per week.  Today's visit was #: 6 Starting weight: 268 lbs Starting date: 12/16/2019 Today's weight: 273 lbs Today's date: 03/22/2020 Total lbs lost to date: 0 Total lbs lost since last in-office visit: 1  Interim History: Joy [name] notes she does not have an appetite and does not eat enough food. She does not feel she did well with the low carbohydrate plan because there is no definite amount of food she must eat.   Subjective:   1. Vitamin D deficiency Astha's last Vit D level was low at 12.7. She is on weekly prescription Vit D.  2. Essential hypertension Marieke's blood pressure is stable. Her lisinopril dose was decreased at her last office visit due to hypotension.   BP Readings from Last 3 Encounters:  03/22/20 107/70  02/26/20 100/68  02/12/20 109/76   Lab Results  Component Value Date   CREATININE 1.05 11/25/2019   CREATININE 1.10 (H) 08/14/2016   CREATININE 0.95 11/17/2014   Assessment/Plan:   1. Vitamin D deficiency Low Vitamin D level contributes to fatigue and are associated with obesity, breast, and colon cancer. We will refill prescription Vitamin D for 1 month. Joy [name] will follow-up for routine testing of Vitamin D, at least 2-3 times per year to avoid over-replacement.  - Vitamin D, Ergocalciferol, (DRISDOL) 1.25 MG (50000 UNIT) CAPS capsule; Take 1 capsule (50,000 Units total) by mouth every 7 (seven) days.  Dispense: 4 capsule; Refill: 0  2. Essential hypertension Joy [name] is working on healthy weight loss and exercise to improve blood pressure control. We will watch for  signs of hypotension as she continues her lifestyle modifications. Joy [name] will continue lisinopril at 5 mg daily, and will follow up as directed.  3. Class 3 severe obesity with serious comorbidity and body mass index (BMI) of 45.0 to 49.9 in adult, unspecified obesity type (HCC) Joy [name] is currently in the action stage of change. As such, her goal is to continue with weight loss efforts. She has agreed to the Category 1 Plan.   We switched from Low carbohydrate plan back to the Category 1 plan. Handout given today: Protein Exchanges.  Exercise goals: As is.  Behavioral modification strategies: increasing lean protein intake.  Joy [name] has agreed to follow-up with our clinic in 2 to 3 weeks. She was informed of the importance of frequent follow-up visits to maximize her success with intensive lifestyle modifications for her multiple health conditions.   Objective:   Blood pressure 107/70, pulse 68, temperature 98.1 F (36.7 C), temperature source Oral, height 5\' 4"  (1.626 m), weight 273 lb (123.8 kg), SpO2 99 %. Body mass index is 46.86 kg/m.  General: Cooperative, alert, well developed, in no acute distress. HEENT: Conjunctivae and lids unremarkable. Cardiovascular: Regular rhythm.  Lungs: Normal work of breathing. Neurologic: No focal deficits.   Lab Results  Component Value Date   CREATININE 1.05 11/25/2019   BUN 12 11/25/2019   NA 139 11/25/2019   K 4.4 11/25/2019   CL 102 11/25/2019   CO2 31 11/25/2019   Lab Results  Component Value Date   ALT 16 11/25/2019   AST 19 11/25/2019  ALKPHOS 104 11/25/2019   BILITOT 0.6 11/25/2019   Lab Results  Component Value Date   HGBA1C 5.6 12/16/2019   Lab Results  Component Value Date   INSULIN 11.8 12/16/2019   Lab Results  Component Value Date   TSH 2.100 12/16/2019   Lab Results  Component Value Date   CHOL 214 (H) 11/25/2019   HDL 41.80 11/25/2019   LDLCALC 156 (H) 11/25/2019   TRIG 82.0 11/25/2019    CHOLHDL 5 11/25/2019   Lab Results  Component Value Date   WBC 6.5 12/16/2019   HGB 11.4 12/16/2019   HCT 35.2 12/16/2019   MCV 93 12/16/2019   PLT 277 12/16/2019   Lab Results  Component Value Date   IRON 46 11/25/2019   TIBC 252 09/26/2013   FERRITIN 242.6 11/25/2019    Obesity Behavioral Intervention Documentation for Insurance:   Approximately 15 minutes were spent on the discussion below.  ASK: We discussed the diagnosis of obesity with Jailyn today and Diahn agreed to give [name] permission to discuss obesity behavioral modification therapy today.  ASSESS: Aretta has the diagnosis of obesity and her BMI today is 46.84. Royelle is in the action stage of change.   ADVISE: Kanesha was educated on the multiple health risks of obesity as well as the benefit of weight loss to improve her health. She was advised of the need for long term treatment and the importance of lifestyle modifications to improve her current health and to decrease her risk of future health problems.  AGREE: Multiple dietary modification options and treatment options were discussed and Alaynna agreed to follow the recommendations documented in the above note.  ARRANGE: Annalynne was educated on the importance of frequent visits to treat obesity as outlined per CMS and USPSTF guidelines and agreed to schedule her next follow up appointment today.  Attestation Statements:   Reviewed by clinician on day of visit: allergies, medications, problem list, medical history, surgical history, family history, social history, and previous encounter notes.   Wilhemena Durie, am acting as Location manager for [employer], FNP-C.  I have reviewed the above documentation for accuracy and completeness, and I agree with the above. -  Georgianne Fick, FNP

## 2020-03-23 ENCOUNTER — Other Ambulatory Visit: Payer: Self-pay | Admitting: Family

## 2020-04-08 ENCOUNTER — Encounter (INDEPENDENT_AMBULATORY_CARE_PROVIDER_SITE_OTHER): Payer: Self-pay | Admitting: Family Medicine

## 2020-04-08 ENCOUNTER — Other Ambulatory Visit: Payer: Self-pay

## 2020-04-08 ENCOUNTER — Ambulatory Visit (INDEPENDENT_AMBULATORY_CARE_PROVIDER_SITE_OTHER): Payer: Medicare Other | Admitting: Family Medicine

## 2020-04-08 VITALS — BP 106/72 | HR 73 | Temp 98.0°F | Ht 64.0 in | Wt 276.0 lb

## 2020-04-08 DIAGNOSIS — E88819 Insulin resistance, unspecified: Secondary | ICD-10-CM

## 2020-04-08 DIAGNOSIS — E559 Vitamin D deficiency, unspecified: Secondary | ICD-10-CM | POA: Diagnosis not present

## 2020-04-08 DIAGNOSIS — E8881 Metabolic syndrome: Secondary | ICD-10-CM

## 2020-04-08 DIAGNOSIS — Z6841 Body Mass Index (BMI) 40.0 and over, adult: Secondary | ICD-10-CM

## 2020-04-08 DIAGNOSIS — E7849 Other hyperlipidemia: Secondary | ICD-10-CM | POA: Diagnosis not present

## 2020-04-08 DIAGNOSIS — Z9189 Other specified personal risk factors, not elsewhere classified: Secondary | ICD-10-CM

## 2020-04-08 DIAGNOSIS — R7303 Prediabetes: Secondary | ICD-10-CM | POA: Diagnosis not present

## 2020-04-08 MED ORDER — SAXENDA 18 MG/3ML ~~LOC~~ SOPN: 0.6000 mg | PEN_INJECTOR | Freq: Every day | SUBCUTANEOUS | 0 refills | Status: DC

## 2020-04-08 NOTE — Progress Notes (Signed)
Chief Complaint:   OBESITY Joy [name] is here to discuss her progress with her obesity treatment plan along with follow-up of her obesity related diagnoses. Joy [name] is on the Category 1 Plan and states she is following her eating plan approximately 100% of the time. Joy [name] states she is doing 0 minutes 0 times per week.  Today's visit was #: 7 Starting weight: 268 lbs Starting date: 12/16/2019 Today's weight: 276 lbs Today's date: 04/08/2020 Total lbs lost to date: 0 Total lbs lost since last in-office visit: 0  Interim History: Joy [name] switched back to the Category 1 plan for the last few weeks, and she voices that she feels stumped by weight gain. She is eating eggs for breakfast, sandwich for lunch, and chicken and broccoli for dinner. She denies hunger, as she is used to eating 1 time per day.  Subjective:   1. Insulin resistance Joy [name]'s last A1c was 5.6 and insulin 11.8. She notes minimal carbohydrate cravings if any.  2. Vitamin D deficiency Joy [name] denies nausea, vomiting, or muscle weakness, but she notes fatigue. Last Vit D level was 12.7. She is on prescription Vit D.  3. Other hyperlipidemia Joy [name]'s last LDL was 156, HDl 41, and triglycerides 82. She is on statin, and denies myalgias. Last LFTs were within normal limits.  4. At risk for osteoporosis Joy [name] is at higher risk of osteopenia and osteoporosis due to Vitamin D deficiency.   Assessment/Plan:   1. Insulin resistance Joy [name] will continue to work on weight loss, exercise, and decreasing simple carbohydrates to help decrease the risk of diabetes. We will check labs today. Joy [name] agreed to follow-up with [name] as directed to closely monitor her progress.  - Comprehensive metabolic panel - Hemoglobin A1c - Insulin, random  2. Vitamin D deficiency Low Vitamin D level contributes to fatigue and are associated with obesity, breast, and colon cancer. We will check labs today. Joy [name] will  follow-up for routine testing of Vitamin D, at least 2-3 times per year to avoid over-replacement.  - VITAMIN D 25 Hydroxy (Vit-D Deficiency, Fractures)  3. Other hyperlipidemia Cardiovascular risk and specific lipid/LDL goals reviewed. We discussed several lifestyle modifications today. Joy [name] will continue to work on diet, exercise and weight loss efforts. We will check labs today. Orders and follow up as documented in patient record.   Counseling Intensive lifestyle modifications are the first line treatment for this issue. . Dietary changes: Increase soluble fiber. Decrease simple carbohydrates. . Exercise changes: Moderate to vigorous-intensity aerobic activity 150 minutes per week if tolerated. . Lipid-lowering medications: see documented in medical record.  - Lipid Panel With LDL/HDL Ratio  4. At risk for osteoporosis Joy [name] was given approximately 15 minutes of osteoporosis prevention counseling today. Joy [name] is at risk for osteopenia and osteoporosis due to her Vitamin D deficiency. She was encouraged to take her Vitamin D and follow her higher calcium diet and increase strengthening exercise to help strengthen her bones and decrease her risk of osteopenia and osteoporosis.  Repetitive spaced learning was employed today to elicit superior memory formation and behavioral change.  5. Class 3 severe obesity with serious comorbidity and body mass index (BMI) of 45.0 to 49.9 in adult, unspecified obesity type (HCC) Joy [name] is currently in the action stage of change. As such, her goal is to continue with weight loss efforts. She has agreed to the Category 1 Plan.   We discussed various medication options to help Joy [name] with her weight loss efforts and we both agreed to start  Saxenda 0.6 mg SubQ daily with no refills.  - Liraglutide -Weight Management (SAXENDA) 18 MG/3ML SOPN; Inject 0.1 mLs (0.6 mg total) into the skin daily.  Dispense: 6 mL; Refill: 0  Exercise goals: No  exercise has been prescribed at this time.  Behavioral modification strategies: increasing lean protein intake, increasing vegetables, meal planning and cooking strategies, keeping healthy foods in the home and planning for success.  Joy [name] has agreed to follow-up with our clinic in 2 weeks. She was informed of the importance of frequent follow-up visits to maximize her success with intensive lifestyle modifications for her multiple health conditions.   Objective:   Blood pressure 106/72, pulse 73, temperature 98 F (36.7 C), temperature source Oral, height 5\' 4"  (1.626 m), weight 276 lb (125.2 kg), SpO2 99 %. Body mass index is 47.38 kg/m.  General: Cooperative, alert, well developed, in no acute distress. HEENT: Conjunctivae and lids unremarkable. Cardiovascular: Regular rhythm.  Lungs: Normal work of breathing. Neurologic: No focal deficits.   Lab Results  Component Value Date   CREATININE 1.05 11/25/2019   BUN 12 11/25/2019   NA 139 11/25/2019   K 4.4 11/25/2019   CL 102 11/25/2019   CO2 31 11/25/2019   Lab Results  Component Value Date   ALT 16 11/25/2019   AST 19 11/25/2019   ALKPHOS 104 11/25/2019   BILITOT 0.6 11/25/2019   Lab Results  Component Value Date   HGBA1C 5.6 12/16/2019   Lab Results  Component Value Date   INSULIN 11.8 12/16/2019   Lab Results  Component Value Date   TSH 2.100 12/16/2019   Lab Results  Component Value Date   CHOL 214 (H) 11/25/2019   HDL 41.80 11/25/2019   LDLCALC 156 (H) 11/25/2019   TRIG 82.0 11/25/2019   CHOLHDL 5 11/25/2019   Lab Results  Component Value Date   WBC 6.5 12/16/2019   HGB 11.4 12/16/2019   HCT 35.2 12/16/2019   MCV 93 12/16/2019   PLT 277 12/16/2019   Lab Results  Component Value Date   IRON 46 11/25/2019   TIBC 252 09/26/2013   FERRITIN 242.6 11/25/2019   Attestation Statements:   Reviewed by clinician on day of visit: allergies, medications, problem list, medical history, surgical  history, family history, social history, and previous encounter notes.   I, Trixie Dredge, am acting as transcriptionist for Coralie Common, MD.  I have reviewed the above documentation for accuracy and completeness, and I agree with the above. - Jinny Blossom, MD

## 2020-04-09 LAB — COMPREHENSIVE METABOLIC PANEL
ALT: 22 IU/L (ref 0–32)
AST: 24 IU/L (ref 0–40)
Albumin/Globulin Ratio: 1.5 (ref 1.2–2.2)
Albumin: 4.1 g/dL (ref 3.8–4.9)
Alkaline Phosphatase: 105 IU/L (ref 48–121)
BUN/Creatinine Ratio: 14 (ref 9–23)
BUN: 15 mg/dL (ref 6–24)
Bilirubin Total: 0.5 mg/dL (ref 0.0–1.2)
CO2: 26 mmol/L (ref 20–29)
Calcium: 9.2 mg/dL (ref 8.7–10.2)
Chloride: 106 mmol/L (ref 96–106)
Creatinine, Ser: 1.04 mg/dL — ABNORMAL HIGH (ref 0.57–1.00)
GFR calc Af Amer: 70 mL/min/{1.73_m2} (ref >59)
GFR calc non Af Amer: 61 mL/min/{1.73_m2} (ref >59)
Globulin, Total: 2.7 g/dL (ref 1.5–4.5)
Glucose: 96 mg/dL (ref 65–99)
Potassium: 4.6 mmol/L (ref 3.5–5.2)
Sodium: 143 mmol/L (ref 134–144)
Total Protein: 6.8 g/dL (ref 6.0–8.5)

## 2020-04-09 LAB — HEMOGLOBIN A1C
Est. average glucose Bld gHb Est-mCnc: 117 mg/dL
Hgb A1c MFr Bld: 5.7 % — ABNORMAL HIGH (ref 4.8–5.6)

## 2020-04-09 LAB — LIPID PANEL WITH LDL/HDL RATIO
Cholesterol, Total: 139 mg/dL (ref 100–199)
HDL: 44 mg/dL (ref >39)
LDL Chol Calc (NIH): 80 mg/dL (ref 0–99)
LDL/HDL Ratio: 1.8 ratio (ref 0.0–3.2)
Triglycerides: 76 mg/dL (ref 0–149)
VLDL Cholesterol Cal: 15 mg/dL (ref 5–40)

## 2020-04-09 LAB — INSULIN, RANDOM: INSULIN: 24 u[IU]/mL (ref 2.6–24.9)

## 2020-04-09 LAB — VITAMIN D 25 HYDROXY (VIT D DEFICIENCY, FRACTURES): Vit D, 25-Hydroxy: 26.2 ng/mL — ABNORMAL LOW (ref 30.0–100.0)

## 2020-04-18 ENCOUNTER — Other Ambulatory Visit (INDEPENDENT_AMBULATORY_CARE_PROVIDER_SITE_OTHER): Payer: Self-pay | Admitting: Family Medicine

## 2020-04-18 DIAGNOSIS — E559 Vitamin D deficiency, unspecified: Secondary | ICD-10-CM

## 2020-04-21 ENCOUNTER — Encounter (INDEPENDENT_AMBULATORY_CARE_PROVIDER_SITE_OTHER): Payer: Self-pay

## 2020-04-21 NOTE — Telephone Encounter (Signed)
My chart message sent to pt.

## 2020-04-22 ENCOUNTER — Other Ambulatory Visit (INDEPENDENT_AMBULATORY_CARE_PROVIDER_SITE_OTHER): Payer: Self-pay | Admitting: Family Medicine

## 2020-04-22 DIAGNOSIS — E559 Vitamin D deficiency, unspecified: Secondary | ICD-10-CM

## 2020-04-26 ENCOUNTER — Other Ambulatory Visit: Payer: Self-pay

## 2020-04-26 ENCOUNTER — Ambulatory Visit (INDEPENDENT_AMBULATORY_CARE_PROVIDER_SITE_OTHER): Payer: Medicare Other | Admitting: Family Medicine

## 2020-04-26 ENCOUNTER — Encounter (INDEPENDENT_AMBULATORY_CARE_PROVIDER_SITE_OTHER): Payer: Self-pay | Admitting: Family Medicine

## 2020-04-26 VITALS — BP 100/70 | HR 72 | Temp 98.0°F | Ht 64.0 in | Wt 271.0 lb

## 2020-04-26 DIAGNOSIS — R7303 Prediabetes: Secondary | ICD-10-CM | POA: Diagnosis not present

## 2020-04-26 DIAGNOSIS — Z6841 Body Mass Index (BMI) 40.0 and over, adult: Secondary | ICD-10-CM | POA: Diagnosis not present

## 2020-04-26 DIAGNOSIS — E66813 Obesity, class 3: Secondary | ICD-10-CM

## 2020-04-26 DIAGNOSIS — I10 Essential (primary) hypertension: Secondary | ICD-10-CM | POA: Diagnosis not present

## 2020-04-26 MED ORDER — BD PEN NEEDLE NANO 2ND GEN 32G X 4 MM MISC: 0 refills | Status: DC

## 2020-04-26 MED ORDER — VICTOZA 18 MG/3ML ~~LOC~~ SOPN: 0.6000 mg | PEN_INJECTOR | Freq: Every day | SUBCUTANEOUS | 0 refills | Status: DC

## 2020-04-26 NOTE — Progress Notes (Signed)
Chief Complaint:   OBESITY Joy [name] is here to discuss her progress with her obesity treatment plan along with follow-up of her obesity related diagnoses. Joy [name] is on the Category 1 Plan and states she is following her eating plan approximately 90% of the time. Joy [name] states she is doing chair yoga for 45 minutes 2 times per week.  Today's visit was #: 8 Starting weight: 268 lbs Starting date: 12/16/2019 Today's weight: 271 lbs Today's date: 04/27/2020 Total lbs lost to date: 0 Total lbs lost since last in-office visit: 5  Interim History: Joy [name] is no longer skipping meals. She is weighing her meat at lunch and eating 4 oz of meat. She has not picked up Saxenda due to cost.  Subjective:   1. Essential hypertension Avira's blood pressure is low today. She is on 5 mg of lisinopril, and she denies dizziness.   BP Readings from Last 3 Encounters:  04/26/20 100/70  04/08/20 106/72  03/22/20 107/70   Lab Results  Component Value Date   CREATININE 1.04 (H) 04/08/2020   CREATININE 1.05 11/25/2019   CREATININE 1.10 (H) 08/14/2016   2. Pre-diabetes Joy [name] has a diagnosis of pre-diabetes based on her elevated Hgb A1c and was informed this puts her at greater risk of developing diabetes. Last A1c was 5.7. She continues to work on diet and exercise to decrease her risk of diabetes. She denies nausea or hypoglycemia.  Lab Results  Component Value Date   HGBA1C 5.7 (H) 04/08/2020   Lab Results  Component Value Date   INSULIN 24.0 04/08/2020   INSULIN 11.8 12/16/2019   Assessment/Plan:   1. Essential hypertension Kariss will continue lisinopril, and will continue working on healthy weight loss and exercise to improve blood pressure control. We will continue to monitor her blood pressure, and watch for signs of hypotension as she continues her lifestyle modifications.  2. Pre-diabetes Joy [name] will continue to work on weight loss, exercise, and decreasing  simple carbohydrates to help decrease the risk of diabetes. Joy [name] agreed to start Victoza 0.6 mg SubQ daily with no refills, and nano needles #100 with no refills.   - liraglutide (VICTOZA) 18 MG/3ML SOPN; Inject 0.6 mg into the skin daily.  Dispense: 3 mL; Refill: 0 - Insulin Pen Needle (BD PEN NEEDLE NANO 2ND GEN) 32G X 4 MM MISC; Use 1 needle daily to inject Victoza.  Dispense: 100 each; Refill: 0  3. Class 3 severe obesity with serious comorbidity and body mass index (BMI) of 45.0 to 49.9 in adult, unspecified obesity type (HCC) Joy [name] is currently in the action stage of change. As such, her goal is to continue with weight loss efforts. She has agreed to the Category 1 Plan.   Joy [name] is to add a protein snack (protein equivalent handout was given) to 4 oz of meat at supper. We will see if we can get prior authorization for Saxenda.  Exercise goals: As is.  Behavioral modification strategies: increasing lean protein intake.  ASK: We discussed the diagnosis of obesity with @NAME @ today and @FNAME @ agreed to give [name] permission to discuss obesity behavioral modification therapy today.  ASSESS: Joy [name] has the diagnosis of obesity and her BMI today is 46.49. She in the action stage of change.  ADVISE: Joy [name] was educated on the multiple health risks of obesity as well as the benefit of weight loss to improve her health. She was advised of the need for long term treatment and the importance of lifestyle modifications to improve her  current health and to decrease her risk of future health problems.  AGREE: Multiple dietary modification options and treatment options were discussed and  she agreed to follow the recommendations documented in the above note.  ARRANGE: She was educated on the importance of frequent visits to treat obesity as outlined per CMS and USPSTF guidelines and agreed to schedule her next follow up appointment today.     Joy [name] has agreed to follow-up with  our clinic in 2 weeks.  Objective:   Blood pressure 100/70, pulse 72, temperature 98 F (36.7 C), temperature source Oral, height 5\' 4"  (1.626 m), weight 271 lb (122.9 kg), SpO2 100 %. Body mass index is 46.52 kg/m.  General: Cooperative, alert, well developed, in no acute distress. HEENT: Conjunctivae and lids unremarkable. Cardiovascular: Regular rhythm.  Lungs: Normal work of breathing. Neurologic: No focal deficits.   Lab Results  Component Value Date   CREATININE 1.04 (H) 04/08/2020   BUN 15 04/08/2020   NA 143 04/08/2020   K 4.6 04/08/2020   CL 106 04/08/2020   CO2 26 04/08/2020   Lab Results  Component Value Date   ALT 22 04/08/2020   AST 24 04/08/2020   ALKPHOS 105 04/08/2020   BILITOT 0.5 04/08/2020   Lab Results  Component Value Date   HGBA1C 5.7 (H) 04/08/2020   HGBA1C 5.6 12/16/2019   Lab Results  Component Value Date   INSULIN 24.0 04/08/2020   INSULIN 11.8 12/16/2019   Lab Results  Component Value Date   TSH 2.100 12/16/2019   Lab Results  Component Value Date   CHOL 139 04/08/2020   HDL 44 04/08/2020   LDLCALC 80 04/08/2020   TRIG 76 04/08/2020   CHOLHDL 5 11/25/2019   Lab Results  Component Value Date   WBC 6.5 12/16/2019   HGB 11.4 12/16/2019   HCT 35.2 12/16/2019   MCV 93 12/16/2019   PLT 277 12/16/2019   Lab Results  Component Value Date   IRON 46 11/25/2019   TIBC 252 09/26/2013   FERRITIN 242.6 11/25/2019   Attestation Statements:   Reviewed by clinician on day of visit: allergies, medications, problem list, medical history, surgical history, family history, social history, and previous encounter notes.   Wilhemena Durie, am acting as Location manager for [employer], FNP-C.  I have reviewed the above documentation for accuracy and completeness, and I agree with the above. -  Georgianne Fick, FNP

## 2020-04-27 ENCOUNTER — Encounter (INDEPENDENT_AMBULATORY_CARE_PROVIDER_SITE_OTHER): Payer: Self-pay | Admitting: Family Medicine

## 2020-04-27 DIAGNOSIS — R7303 Prediabetes: Secondary | ICD-10-CM | POA: Insufficient documentation

## 2020-05-11 ENCOUNTER — Other Ambulatory Visit: Payer: Self-pay

## 2020-05-11 ENCOUNTER — Ambulatory Visit (INDEPENDENT_AMBULATORY_CARE_PROVIDER_SITE_OTHER): Payer: Medicare Other | Admitting: Family Medicine

## 2020-05-11 ENCOUNTER — Encounter (INDEPENDENT_AMBULATORY_CARE_PROVIDER_SITE_OTHER): Payer: Self-pay | Admitting: Family Medicine

## 2020-05-11 VITALS — BP 124/83 | HR 83 | Temp 98.2°F | Ht 64.0 in | Wt 270.0 lb

## 2020-05-11 DIAGNOSIS — E559 Vitamin D deficiency, unspecified: Secondary | ICD-10-CM

## 2020-05-11 DIAGNOSIS — R7303 Prediabetes: Secondary | ICD-10-CM | POA: Diagnosis not present

## 2020-05-11 DIAGNOSIS — Z6841 Body Mass Index (BMI) 40.0 and over, adult: Secondary | ICD-10-CM | POA: Diagnosis not present

## 2020-05-11 MED ORDER — VICTOZA 18 MG/3ML ~~LOC~~ SOPN: 1.2000 mg | PEN_INJECTOR | Freq: Every day | SUBCUTANEOUS | 0 refills | Status: DC

## 2020-05-11 MED ORDER — VITAMIN D (ERGOCALCIFEROL) 1.25 MG (50000 UNIT) PO CAPS: 50000.0000 [IU] | ORAL_CAPSULE | ORAL | 0 refills | Status: DC

## 2020-05-11 NOTE — Progress Notes (Signed)
Chief Complaint:   OBESITY Joy [name] is here to discuss her progress with her obesity treatment plan along with follow-up of her obesity related diagnoses. Joy [name] is on the Category 1 Plan and states she is following her eating plan approximately 95% of the time. Joy [name] states she is doing 0 minutes 0 times per week.  Today's visit was #: 9 Starting weight: 268 lbs Starting date: 12/16/2019 Today's weight: 270 lbs Today's date: 05/11/2020 Total lbs lost to date: 0 Total lbs lost since last in-office visit: 1  Interim History: Joy [name] denies being hungry and she is forcing herself to eat. She voices occasionally she vomits from forcing herself to eat. For breakfast she does the egg option, lunch is a half of sandwich, and dinner is 4 oz of meat and protein one bar and fruit. She recently started Victoza.  Subjective:   1. Vitamin D deficiency Joy [name] denies nausea, vomiting, or muscle weakness, but notes fatigue. Last Vit D level was 26.2.  2. Pre-diabetes Joy [name] is on Victoza 0.6 mg and she denies GI side effects.  Assessment/Plan:   1. Vitamin D deficiency Low Vitamin D level contributes to fatigue and are associated with obesity, breast, and colon cancer. We will refill prescription Vitamin D for 1 month. Joy [name] will follow-up for routine testing of Vitamin D, at least 2-3 times per year to avoid over-replacement.  - Vitamin D, Ergocalciferol, (DRISDOL) 1.25 MG (50000 UNIT) CAPS capsule; Take 1 capsule (50,000 Units total) by mouth every 7 (seven) days.  Dispense: 4 capsule; Refill: 0  2. Pre-diabetes Joy [name] will continue to work on weight loss, exercise, and decreasing simple carbohydrates to help decrease the risk of diabetes. Joy [name] agreed to increase Victoza to 1.2 mg SubQ daily, with no refill.  - liraglutide (VICTOZA) 18 MG/3ML SOPN; Inject 1.2 mg into the skin daily.  Dispense: 12 mL; Refill: 0  3. Class 3 severe obesity with serious comorbidity  and body mass index (BMI) of 45.0 to 49.9 in adult, unspecified obesity type (HCC) Joy [name] is currently in the action stage of change. As such, her goal is to continue with weight loss efforts. She has agreed to the Category 1 Plan.   Exercise goals: She is to restart chair yoga and walking 2-3 times per week.  Behavioral modification strategies: increasing lean protein intake, meal planning and cooking strategies, keeping healthy foods in the home and planning for success.  Everett has agreed to follow-up with our clinic in 2 weeks. She was informed of the importance of frequent follow-up visits to maximize her success with intensive lifestyle modifications for her multiple health conditions.   Objective:   Blood pressure 124/83, pulse 83, temperature 98.2 F (36.8 C), temperature source Oral, height 5\' 4"  (1.626 m), weight 270 lb (122.5 kg), SpO2 99 %. Body mass index is 46.35 kg/m.  General: Cooperative, alert, well developed, in no acute distress. HEENT: Conjunctivae and lids unremarkable. Cardiovascular: Regular rhythm.  Lungs: Normal work of breathing. Neurologic: No focal deficits.   Lab Results  Component Value Date   CREATININE 1.04 (H) 04/08/2020   BUN 15 04/08/2020   NA 143 04/08/2020   K 4.6 04/08/2020   CL 106 04/08/2020   CO2 26 04/08/2020   Lab Results  Component Value Date   ALT 22 04/08/2020   AST 24 04/08/2020   ALKPHOS 105 04/08/2020   BILITOT 0.5 04/08/2020   Lab Results  Component Value Date   HGBA1C 5.7 (H) 04/08/2020   HGBA1C 5.6  12/16/2019   Lab Results  Component Value Date   INSULIN 24.0 04/08/2020   INSULIN 11.8 12/16/2019   Lab Results  Component Value Date   TSH 2.100 12/16/2019   Lab Results  Component Value Date   CHOL 139 04/08/2020   HDL 44 04/08/2020   LDLCALC 80 04/08/2020   TRIG 76 04/08/2020   CHOLHDL 5 11/25/2019   Lab Results  Component Value Date   WBC 6.5 12/16/2019   HGB 11.4 12/16/2019   HCT 35.2 12/16/2019    MCV 93 12/16/2019   PLT 277 12/16/2019   Lab Results  Component Value Date   IRON 46 11/25/2019   TIBC 252 09/26/2013   FERRITIN 242.6 11/25/2019   Attestation Statements:   Reviewed by clinician on day of visit: allergies, medications, problem list, medical history, surgical history, family history, social history, and previous encounter notes.  Time spent on visit including pre-visit chart review and post-visit care and charting was 18 minutes.    I, Joy [name], am acting as transcriptionist for Coralie Common, MD.  I have reviewed the above documentation for accuracy and completeness, and I agree with the above. - Joy Blossom, MD

## 2020-05-13 ENCOUNTER — Other Ambulatory Visit (INDEPENDENT_AMBULATORY_CARE_PROVIDER_SITE_OTHER): Payer: Self-pay | Admitting: Family Medicine

## 2020-05-13 DIAGNOSIS — E559 Vitamin D deficiency, unspecified: Secondary | ICD-10-CM

## 2020-05-17 DIAGNOSIS — M159 Polyosteoarthritis, unspecified: Secondary | ICD-10-CM | POA: Diagnosis not present

## 2020-05-17 DIAGNOSIS — M0609 Rheumatoid arthritis without rheumatoid factor, multiple sites: Secondary | ICD-10-CM | POA: Diagnosis not present

## 2020-05-17 DIAGNOSIS — Z79899 Other long term (current) drug therapy: Secondary | ICD-10-CM | POA: Diagnosis not present

## 2020-05-25 ENCOUNTER — Other Ambulatory Visit: Payer: Self-pay

## 2020-05-25 ENCOUNTER — Encounter (INDEPENDENT_AMBULATORY_CARE_PROVIDER_SITE_OTHER): Payer: Self-pay | Admitting: Family Medicine

## 2020-05-25 ENCOUNTER — Ambulatory Visit (INDEPENDENT_AMBULATORY_CARE_PROVIDER_SITE_OTHER): Payer: Medicare Other | Admitting: Family Medicine

## 2020-05-25 VITALS — BP 119/78 | HR 87 | Temp 98.2°F | Ht 64.0 in | Wt 266.0 lb

## 2020-05-25 DIAGNOSIS — Z6841 Body Mass Index (BMI) 40.0 and over, adult: Secondary | ICD-10-CM | POA: Diagnosis not present

## 2020-05-25 DIAGNOSIS — R7303 Prediabetes: Secondary | ICD-10-CM

## 2020-05-25 DIAGNOSIS — E559 Vitamin D deficiency, unspecified: Secondary | ICD-10-CM | POA: Diagnosis not present

## 2020-05-31 NOTE — Progress Notes (Signed)
Chief Complaint:   OBESITY Joy [name] is here to discuss her progress with her obesity treatment plan along with follow-up of her obesity related diagnoses. Joy [name] is on the Category 1 Plan and states she is following her eating plan approximately 100% of the time. Joy [name] states she is doing chair yoga for 45 minutes 3 times per week.  Today's visit was #: 10 Starting weight: 268 lbs Starting date: 12/16/2019 Today's weight: 266 lbs Today's date: 05/25/2020 Total lbs lost to date: 2 lbs Total lbs lost since last in-office visit: 4 lbs  Interim History: Joy [name] has been following Category 1 almost 100% and getting all food in.  She says she has been exercising 45 minutes 3 times per week.  She is getting 4 ounces of protein at lunch and 6 ounces at dinner.  She is eating a piece of fruit for a snack.  She denies cravings.  Subjective:   1. Prediabetes Joy [name] has a diagnosis of prediabetes based on her elevated HgA1c and was informed this puts her at greater risk of developing diabetes. She continues to work on diet and exercise to decrease her risk of diabetes. She denies nausea or hypoglycemia.  She is on Victoza 1.5 mg daily.  Denies GI side effects of Victoza and is able to eat all food on the plan.  Last A1c 5.7.  Lab Results  Component Value Date   HGBA1C 5.7 (H) 04/08/2020   Lab Results  Component Value Date   INSULIN 24.0 04/08/2020   INSULIN 11.8 12/16/2019   2. Vitamin D deficiency Joy [name]'s Vitamin D level was 26.2 on 04/08/2020. She is currently taking prescription vitamin D 50,000 IU each week. She denies nausea, vomiting or muscle weakness.  Endorses fatigue.  Assessment/Plan:   1. Prediabetes Joy [name] will continue to work on weight loss, exercise, and decreasing simple carbohydrates to help decrease the risk of diabetes.  Continue Victoza at current dose.  2. Vitamin D deficiency Low Vitamin D level contributes to fatigue and are associated with  obesity, breast, and colon cancer. She agrees to continue to take prescription Vitamin D @50 ,000 IU every week and will follow-up for routine testing of Vitamin D, at least 2-3 times per year to avoid over-replacement.    3. Class 3 severe obesity with serious comorbidity and body mass index (BMI) of 45.0 to 49.9 in adult, unspecified obesity type (HCC)  Joy [name] is currently in the action stage of change. As such, her goal is to continue with weight loss efforts. She has agreed to the Category 1 Plan.   Exercise goals: As is.  Behavioral modification strategies: increasing lean protein intake, meal planning and cooking strategies, keeping healthy foods in the home and planning for success.  Joy [name] has agreed to follow-up with our clinic in 2-3 weeks. She was informed of the importance of frequent follow-up visits to maximize her success with intensive lifestyle modifications for her multiple health conditions.   Objective:   Blood pressure 119/78, pulse 87, temperature 98.2 F (36.8 C), temperature source Oral, height 5\' 4"  (1.626 m), weight 266 lb (120.7 kg), SpO2 99 %. Body mass index is 45.66 kg/m.  General: Cooperative, alert, well developed, in no acute distress. HEENT: Conjunctivae and lids unremarkable. Cardiovascular: Regular rhythm.  Lungs: Normal work of breathing. Neurologic: No focal deficits.   Lab Results  Component Value Date   CREATININE 1.04 (H) 04/08/2020   BUN 15 04/08/2020   NA 143 04/08/2020   K 4.6 04/08/2020  CL 106 04/08/2020   CO2 26 04/08/2020   Lab Results  Component Value Date   ALT 22 04/08/2020   AST 24 04/08/2020   ALKPHOS 105 04/08/2020   BILITOT 0.5 04/08/2020   Lab Results  Component Value Date   HGBA1C 5.7 (H) 04/08/2020   HGBA1C 5.6 12/16/2019   Lab Results  Component Value Date   INSULIN 24.0 04/08/2020   INSULIN 11.8 12/16/2019   Lab Results  Component Value Date   TSH 2.100 12/16/2019   Lab Results  Component Value  Date   CHOL 139 04/08/2020   HDL 44 04/08/2020   LDLCALC 80 04/08/2020   TRIG 76 04/08/2020   CHOLHDL 5 11/25/2019   Lab Results  Component Value Date   WBC 6.5 12/16/2019   HGB 11.4 12/16/2019   HCT 35.2 12/16/2019   MCV 93 12/16/2019   PLT 277 12/16/2019   Lab Results  Component Value Date   IRON 46 11/25/2019   TIBC 252 09/26/2013   FERRITIN 242.6 11/25/2019   Attestation Statements:   Reviewed by clinician on day of visit: allergies, medications, problem list, medical history, surgical history, family history, social history, and previous encounter notes.  Time spent on visit including pre-visit chart review and post-visit care and charting was 15 minutes.   I, Water quality scientist, CMA, am acting as transcriptionist for Coralie Common, MD.  I have reviewed the above documentation for accuracy and completeness, and I agree with the above. - Jinny Blossom, MD

## 2020-06-05 ENCOUNTER — Encounter (HOSPITAL_BASED_OUTPATIENT_CLINIC_OR_DEPARTMENT_OTHER): Payer: Self-pay | Admitting: *Deleted

## 2020-06-05 ENCOUNTER — Emergency Department (HOSPITAL_BASED_OUTPATIENT_CLINIC_OR_DEPARTMENT_OTHER)
Admission: EM | Admit: 2020-06-05 | Discharge: 2020-06-05 | Disposition: A | Discharge status: Home / Self Care | Payer: Medicare Other | Attending: Emergency Medicine | Admitting: Emergency Medicine

## 2020-06-05 ENCOUNTER — Other Ambulatory Visit: Payer: Self-pay

## 2020-06-05 DIAGNOSIS — N762 Acute vulvitis: Secondary | ICD-10-CM

## 2020-06-05 DIAGNOSIS — I129 Hypertensive chronic kidney disease with stage 1 through stage 4 chronic kidney disease, or unspecified chronic kidney disease: Secondary | ICD-10-CM | POA: Insufficient documentation

## 2020-06-05 DIAGNOSIS — Z794 Long term (current) use of insulin: Secondary | ICD-10-CM | POA: Insufficient documentation

## 2020-06-05 DIAGNOSIS — E1122 Type 2 diabetes mellitus with diabetic chronic kidney disease: Secondary | ICD-10-CM | POA: Insufficient documentation

## 2020-06-05 DIAGNOSIS — Z7951 Long term (current) use of inhaled steroids: Secondary | ICD-10-CM | POA: Diagnosis not present

## 2020-06-05 DIAGNOSIS — J45909 Unspecified asthma, uncomplicated: Secondary | ICD-10-CM | POA: Insufficient documentation

## 2020-06-05 DIAGNOSIS — N182 Chronic kidney disease, stage 2 (mild): Secondary | ICD-10-CM | POA: Diagnosis not present

## 2020-06-05 DIAGNOSIS — Z79899 Other long term (current) drug therapy: Secondary | ICD-10-CM | POA: Insufficient documentation

## 2020-06-05 HISTORY — DX: Vitamin D deficiency, unspecified: E55.9

## 2020-06-05 MED ORDER — DOXYCYCLINE HYCLATE 100 MG PO CAPS: 100.0000 mg | ORAL_CAPSULE | Freq: Two times a day (BID) | ORAL | 0 refills | Status: DC

## 2020-06-05 NOTE — ED Triage Notes (Addendum)
Pt reports "cyst" on right labia since Monday. States she had [place] wax the prior friday

## 2020-06-05 NOTE — ED Notes (Signed)
Chaperone for EDP Karle Starch for vaginal exam

## 2020-06-05 NOTE — ED Provider Notes (Signed)
[facility] EMERGENCY DEPART[facility] Provider Note  CSN: [id number] Arrival date & time: 06/05/20 1456    History Chief Complaint  Patient presents with  . Cyst    HPI  Joy [name] is a 54 y.o. female with pre-diabetes who reports she noticed a knot on her R labia about 5-6 days ago, this was 2 days after having her pubic hair waxed. She reports some pain/discomfort making it difficult to wear her underwear. She denies any drainage or fever. No dysuria or vaginal discharge.    Past Medical History:  Diagnosis Date  . Asthma   . Derangement of medial meniscus   . Folic acid deficiency   . High cholesterol   . Hypertension   . Joint pain   . 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 Meningitis 2010   hospitalized  . Obesity   . Osteoarthritis 2015  . Pernicious anemia   . Rheumatoid arthritis ([place]) 2015  . Steatosis of liver   . Vitamin D deficiency     Past Surgical History:  Procedure Laterality Date  . ABDOMINAL HYSTERECTOMY  1994   partial  . BREAST SURGERY  2003   BIOPSY LEFT BREAST--BENIGN  . FOOT SURGERY Right   . KNEE SURGERY  2017  . TONSILLECTOMY      Family History  Problem Relation Age of Onset  . Stroke Mother   . Blindness Mother        in left eye due to stroke  . Glaucoma Mother        caused blindness of right eye  . Dementia Mother        died at 66  . Cancer Maternal Aunt 60       ovarian  . Cancer Maternal Grandmother 60       colon  . Diabetes Maternal Aunt   . Diabetes Maternal Aunt     Social History   Tobacco Use  . Smoking status: Never Smoker  . Smokeless tobacco: Never Used  Vaping Use  . Vaping Use: Never used  Substance Use Topics  . Alcohol use: No  . Drug use: No     Home Medications Prior to Admission medications   Medication Sig Start Date End Date Taking? Authorizing Provider  albuterol (PROAIR HFA) 108 (90 Base) MCG/ACT inhaler Inhale 2 puffs into the lungs every 4 (four)  hours as needed for wheezing or shortness of breath. 12/07/15   Charlies Silvers, MD  atorvastatin (LIPITOR) 20 MG tablet TAKE 1 TABLET BY MOUTH  DAILY 03/23/20   Debbrah Alar, NP  beclomethasone (QVAR) 40 MCG/ACT inhaler TWO PUFFS TWICE A DAY TO PREVENT COUGH OR WHEEZE. RINSE, GARGLE AND SPIT AFTER USE 12/14/15   Charlies Silvers, MD  cetirizine (ZYRTEC ALLERGY) 10 MG tablet Take 1 tablet (10 mg total) by mouth daily. 11/15/16   Julianne Rice, MD  doxycycline (VIBRAMYCIN) 100 MG capsule Take 1 capsule (100 mg total) by mouth 2 (two) times daily. 06/05/20   Truddie Hidden, MD  fluticasone (FLONASE) 50 MCG/ACT nasal spray Place 2 sprays into both nostrils daily. 11/15/16   Julianne Rice, MD  fluticasone (FLOVENT HFA) 44 MCG/ACT inhaler Inhale 2 puffs into the lungs once. 12/16/15   Leda Roys, MD  folic acid (FOLVITE) 1 MG tablet Take 1 tablet (1 mg total) by mouth daily. 11/20/19   Debbrah Alar, NP  hydroxychloroquine (PLAQUENIL) 200 MG tablet Take by mouth daily.    [provider]  Insulin Pen Needle (BD PEN NEEDLE NANO 2ND GEN) 32G X 4 MM MISC Use 1 needle daily to inject Victoza. 04/26/20   [place], Joneen Boers, FNP  liraglutide (VICTOZA) 18 MG/3ML SOPN Inject 1.2 mg into the skin daily. 05/11/20   Eber Jones, MD  lisinopril (ZESTRIL) 5 MG tablet Take 1 tablet (5 mg total) by mouth daily. 02/26/20   [place], Joneen Boers, FNP  methotrexate (RHEUMATREX) 2.5 MG tablet Take 15 mg by mouth once a week. Caution:Chemotherapy. Protect from light.    [provider]  Multiple Vitamins-Minerals (WOMENS MULTIVITAMIN PO) Take by mouth.    [provider]  Vitamin D, Ergocalciferol, (DRISDOL) 1.25 MG (50000 UNIT) CAPS capsule Take 1 capsule (50,000 Units total) by mouth every 7 (seven) days. 05/11/20   Eber Jones, MD     Allergies    Patient has no known allergies.   Review of Systems   Review of Systems A comprehensive review of systems was  completed and negative except as noted in HPI.    Physical Exam BP 125/88 (BP Location: Left Arm)   Pulse 92   Temp 98.4 F (36.9 C) (Oral)   Resp 16   Ht 5\' 4"  (1.626 m)   Wt 120.7 kg   SpO2 99%   BMI 45.66 kg/m   Physical Exam Vitals and nursing note reviewed.  HENT:     Head: Normocephalic.     Nose: Nose normal.  Eyes:     Extraocular Movements: Extraocular movements intact.  Pulmonary:     Effort: Pulmonary effort is normal.  Genitourinary:    Comments: Small 1-2cm area of induration, tenderness but no fluctuance on R labia majora Musculoskeletal:        General: Normal range of motion.     Cervical back: Neck supple.  Skin:    Findings: No rash (on exposed skin).  Neurological:     Mental Status: She is alert and oriented to person, place, and time.  Psychiatric:        Mood and Affect: Mood normal.      ED Results / Procedures / Treatments   Labs (all labs ordered are listed, but only abnormal results are displayed) Labs Reviewed - No data to display  EKG None   Radiology No results found.  Procedures Procedures  Medications Ordered in the ED Medications - No data to display   MDM Rules/Calculators/A&P MDM Patient with small area of cellulitis on R labia after waxing. No signs of abscess in need of drainage. Advised to continue warm soaks, will Rx antibiotics and advised PCP follow up for recheck. RTED for any other concerns.   ED Course  I have reviewed the triage vital signs and the nursing notes.  Pertinent labs & imaging results that were available during my care of the patient were reviewed by me and considered in my medical decision making (see chart for details).     Final Clinical Impression(s) / ED Diagnoses Final diagnoses:  Cellulitis of labia    Rx / DC Orders ED Discharge Orders         Ordered    doxycycline (VIBRAMYCIN) 100 MG capsule  2 times daily        06/05/20 1526           Truddie Hidden, MD 06/05/20  1527

## 2020-06-08 ENCOUNTER — Ambulatory Visit (INDEPENDENT_AMBULATORY_CARE_PROVIDER_SITE_OTHER): Payer: Medicare Other | Admitting: Physician Assistant

## 2020-06-08 ENCOUNTER — Other Ambulatory Visit: Payer: Self-pay

## 2020-06-08 ENCOUNTER — Encounter (INDEPENDENT_AMBULATORY_CARE_PROVIDER_SITE_OTHER): Payer: Self-pay | Admitting: Physician Assistant

## 2020-06-08 ENCOUNTER — Other Ambulatory Visit (INDEPENDENT_AMBULATORY_CARE_PROVIDER_SITE_OTHER): Payer: Self-pay | Admitting: Family Medicine

## 2020-06-08 VITALS — BP 102/70 | HR 85 | Temp 98.1°F | Ht 64.0 in | Wt 262.0 lb

## 2020-06-08 DIAGNOSIS — E559 Vitamin D deficiency, unspecified: Secondary | ICD-10-CM

## 2020-06-08 DIAGNOSIS — R7303 Prediabetes: Secondary | ICD-10-CM | POA: Diagnosis not present

## 2020-06-08 DIAGNOSIS — Z6841 Body Mass Index (BMI) 40.0 and over, adult: Secondary | ICD-10-CM

## 2020-06-09 ENCOUNTER — Other Ambulatory Visit (INDEPENDENT_AMBULATORY_CARE_PROVIDER_SITE_OTHER): Payer: Self-pay | Admitting: Physician Assistant

## 2020-06-09 DIAGNOSIS — E559 Vitamin D deficiency, unspecified: Secondary | ICD-10-CM

## 2020-06-09 MED ORDER — VITAMIN D (ERGOCALCIFEROL) 1.25 MG (50000 UNIT) PO CAPS: 50000.0000 [IU] | ORAL_CAPSULE | ORAL | 0 refills | Status: DC

## 2020-06-09 NOTE — Progress Notes (Signed)
Chief Complaint:   [place] is here to discuss her progress with her obesity treatment plan along with follow-up of her obesity related diagnoses. Mimie is on the Category 1 Plan and states she is following her eating plan approximately 100% of the time. Jacob states she is doing chair yoga 45 minutes 3 times per week.  Today's visit was #: 11 Starting weight: 268 lbs Starting date: 12/16/2019 Today's weight: 262 lbs Today's date: 06/08/2020 Total lbs lost to date: 6 Total lbs lost since last in-office visit: 4  Interim History: Ysidra did very well with weight loss. She is not hungry throughout the day, but makes herself eat all of the food on the plan. She continues to weigh her protein and eat her snack calories. She is drinking about 80 oz of water daily.  Subjective:   Prediabetes. Kirti has a diagnosis of prediabetes based on her elevated HgA1c and was informed this puts her at greater risk of developing diabetes. She continues to work on diet and exercise to decrease her risk of diabetes. She denies nausea or hypoglycemia. Elise is on Victoza 1.5 mg, which she is tolerating well, and no side effects are noted. Last A1c 5.7.  Lab Results  Component Value Date   HGBA1C 5.7 (H) 04/08/2020   Lab Results  Component Value Date   INSULIN 24.0 04/08/2020   INSULIN 11.8 12/16/2019   Vitamin D deficiency. Jirah is on prescription Vitamin D, which she is tolerating well. No nausea, vomiting, or muscle weakness. Last level was not at goal.   Ref. Range 04/08/2020 11:54  Vitamin D, 25-Hydroxy Latest Ref Range: 30.0 - 100.0 ng/mL 26.2 (L)   Assessment/Plan:   Prediabetes. Deshundra will continue to work on weight loss, exercise, and decreasing simple carbohydrates to help decrease the risk of diabetes. She will continue same dose of Victoza as directed.   Vitamin D deficiency. Low Vitamin D level contributes to fatigue and are associated  with obesity, breast, and colon cancer. She was given a refill on her Vitamin D, Ergocalciferol, (DRISDOL) 1.25 MG (50000 UNIT) CAPS capsule every week #4 with 0 refills and will follow-up for routine testing of Vitamin D, at least 2-3 times per year to avoid over-replacement.   Class 3 severe obesity with serious comorbidity and body mass index (BMI) of 45.0 to 49.9 in adult, unspecified obesity type ([place]).  Rotunda is currently in the action stage of change. As such, her goal is to continue with weight loss efforts. She has agreed to the Category 1 Plan.   Exercise goals: For substantial health benefits, adults should do at least 150 minutes (2 hours and 30 minutes) a week of moderate-intensity, or 75 minutes (1 hour and 15 minutes) a week of vigorous-intensity aerobic physical activity, or an equivalent combination of moderate- and vigorous-intensity aerobic activity. Aerobic activity should be performed in episodes of at least 10 minutes, and preferably, it should be spread throughout the week.  Behavioral modification strategies: meal planning and cooking strategies and planning for success.  Aneisa has agreed to follow-up with our clinic in 2 weeks. She was informed of the importance of frequent follow-up visits to maximize her success with intensive lifestyle modifications for her multiple health conditions.   Objective:   Blood pressure 102/70, pulse 85, temperature 98.1 F (36.7 C), height 5\' 4"  (1.626 m), weight 262 lb (118.8 kg), SpO2 99 %. Body mass index is 44.97 kg/m.  General: Cooperative, alert, well developed, in  no acute distress. HEENT: Conjunctivae and lids unremarkable. Cardiovascular: Regular rhythm.  Lungs: Normal work of breathing. Neurologic: No focal deficits.   Lab Results  Component Value Date   CREATININE 1.04 (H) 04/08/2020   BUN 15 04/08/2020   NA 143 04/08/2020   K 4.6 04/08/2020   CL 106 04/08/2020   CO2 26 04/08/2020   Lab Results  Component  Value Date   ALT 22 04/08/2020   AST 24 04/08/2020   ALKPHOS 105 04/08/2020   BILITOT 0.5 04/08/2020   Lab Results  Component Value Date   HGBA1C 5.7 (H) 04/08/2020   HGBA1C 5.6 12/16/2019   Lab Results  Component Value Date   INSULIN 24.0 04/08/2020   INSULIN 11.8 12/16/2019   Lab Results  Component Value Date   TSH 2.100 12/16/2019   Lab Results  Component Value Date   CHOL 139 04/08/2020   HDL 44 04/08/2020   LDLCALC 80 04/08/2020   TRIG 76 04/08/2020   CHOLHDL 5 11/25/2019   Lab Results  Component Value Date   WBC 6.5 12/16/2019   HGB 11.4 12/16/2019   HCT 35.2 12/16/2019   MCV 93 12/16/2019   PLT 277 12/16/2019   Lab Results  Component Value Date   IRON 46 11/25/2019   TIBC 252 09/26/2013   FERRITIN 242.6 11/25/2019   Obesity Behavioral Intervention:   Approximately 15 minutes were spent on the discussion below.  ASK: We discussed the diagnosis of obesity with Tere today and Jakyla agreed to give [name] permission to discuss obesity behavioral modification therapy today.  ASSESS: Zaakirah has the diagnosis of obesity and her BMI today is 45.1. Gaylin is in the action stage of change.   ADVISE: Brenda was educated on the multiple health risks of obesity as well as the benefit of weight loss to improve her health. She was advised of the need for long term treatment and the importance of lifestyle modifications to improve her current health and to decrease her risk of future health problems.  AGREE: Multiple dietary modification options and treatment options were discussed and Deondria agreed to follow the recommendations documented in the above note.  ARRANGE: Veleria was educated on the importance of frequent visits to treat obesity as outlined per CMS and USPSTF guidelines and agreed to schedule her next follow up appointment today.  Attestation Statements:   Reviewed by clinician on day of visit: allergies, medications, problem list,  medical history, surgical history, family history, social history, and previous encounter notes.  IMichaelene Song, am acting as transcriptionist for Abby Potash, PA-C   I have reviewed the above documentation for accuracy and completeness, and I agree with the above. Abby Potash, PA-C '

## 2020-06-23 ENCOUNTER — Other Ambulatory Visit: Payer: Self-pay

## 2020-06-23 ENCOUNTER — Ambulatory Visit (INDEPENDENT_AMBULATORY_CARE_PROVIDER_SITE_OTHER): Payer: Medicare Other | Admitting: Family Medicine

## 2020-06-23 ENCOUNTER — Encounter (INDEPENDENT_AMBULATORY_CARE_PROVIDER_SITE_OTHER): Payer: Self-pay | Admitting: Family Medicine

## 2020-06-23 VITALS — BP 102/69 | HR 96 | Temp 98.3°F | Ht 64.0 in | Wt 262.0 lb

## 2020-06-23 DIAGNOSIS — R7303 Prediabetes: Secondary | ICD-10-CM

## 2020-06-23 DIAGNOSIS — E559 Vitamin D deficiency, unspecified: Secondary | ICD-10-CM

## 2020-06-23 DIAGNOSIS — Z6841 Body Mass Index (BMI) 40.0 and over, adult: Secondary | ICD-10-CM

## 2020-06-24 NOTE — Progress Notes (Signed)
Chief Complaint:   OBESITY Joy [name] is here to discuss her progress with her obesity treatment plan along with follow-up of her obesity related diagnoses. Joy [name] is on the Category 1 Plan and states she is following her eating plan approximately 50% of the time. Joy [name] states she is doing 0 minutes 0 times per week.  Today's visit was #: 12 Starting weight: 268 lbs Starting date: 12/16/2019 Today's weight: 262 lbs Today's date: 06/23/2020 Total lbs lost to date: 6 Total lbs lost since last in-office visit: 0  Interim History: Joy [name] is going through some personal stuff, with unexpected bills and her youngest child is going through some stuff. She is cooking for Thanksgiving and she is not looking forward to the prep and cooking. She is interested in alternatives to meat at dinner.  Subjective:   1. Vitamin D deficiency Joy [name] is on prescription Vit D, and she denies nausea, vomiting, or muscle weakness, but notes fatigue.  2. Pre-diabetes Joy [name] is on Victoza 1.5 mg. She is forcing herself to get in as much as she can.  Assessment/Plan:   1. Vitamin D deficiency Low Vitamin D level contributes to fatigue and are associated with obesity, breast, and colon cancer. Caress agreed to continue taking prescription Vitamin D 50,000 IU every week, no refill needed. She will follow-up for routine testing of Vitamin D, at least 2-3 times per year to avoid over-replacement.  2. Pre-diabetes Joy [name] will continue Victoza, and will continue to work on weight loss, exercise, and decreasing simple carbohydrates to help decrease the risk of diabetes.   3. Class 3 severe obesity with serious comorbidity and body mass index (BMI) of 45.0 to 49.9 in adult, unspecified obesity type (HCC) Joy [name] is currently in the action stage of change. As such, her goal is to continue with weight loss efforts. She has agreed to the Category 1 Plan.   Exercise goals: No exercise has been  prescribed at this time.  Behavioral modification strategies: increasing lean protein intake, meal planning and cooking strategies, keeping healthy foods in the home and planning for success.  Joy [name] has agreed to follow-up with our clinic in 3 weeks. She was informed of the importance of frequent follow-up visits to maximize her success with intensive lifestyle modifications for her multiple health conditions.   Objective:   Blood pressure 102/69, pulse 96, temperature 98.3 F (36.8 C), temperature source Oral, height 5\' 4"  (1.626 m), weight 262 lb (118.8 kg), SpO2 98 %. Body mass index is 44.97 kg/m.  General: Cooperative, alert, well developed, in no acute distress. HEENT: Conjunctivae and lids unremarkable. Cardiovascular: Regular rhythm.  Lungs: Normal work of breathing. Neurologic: No focal deficits.   Lab Results  Component Value Date   CREATININE 1.04 (H) 04/08/2020   BUN 15 04/08/2020   NA 143 04/08/2020   K 4.6 04/08/2020   CL 106 04/08/2020   CO2 26 04/08/2020   Lab Results  Component Value Date   ALT 22 04/08/2020   AST 24 04/08/2020   ALKPHOS 105 04/08/2020   BILITOT 0.5 04/08/2020   Lab Results  Component Value Date   HGBA1C 5.7 (H) 04/08/2020   HGBA1C 5.6 12/16/2019   Lab Results  Component Value Date   INSULIN 24.0 04/08/2020   INSULIN 11.8 12/16/2019   Lab Results  Component Value Date   TSH 2.100 12/16/2019   Lab Results  Component Value Date   CHOL 139 04/08/2020   HDL 44 04/08/2020   LDLCALC 80 04/08/2020  TRIG 76 04/08/2020   CHOLHDL 5 11/25/2019   Lab Results  Component Value Date   WBC 6.5 12/16/2019   HGB 11.4 12/16/2019   HCT 35.2 12/16/2019   MCV 93 12/16/2019   PLT 277 12/16/2019   Lab Results  Component Value Date   IRON 46 11/25/2019   TIBC 252 09/26/2013   FERRITIN 242.6 11/25/2019   Attestation Statements:   Reviewed by clinician on day of visit: allergies, medications, problem list, medical history, surgical  history, family history, social history, and previous encounter notes.  Time spent on visit including pre-visit chart review and post-visit care and charting was 15 minutes.    I, Trixie Dredge, am acting as transcriptionist for Coralie Common, MD.  I have reviewed the above documentation for accuracy and completeness, and I agree with the above. - Jinny Blossom, MD

## 2020-07-09 ENCOUNTER — Other Ambulatory Visit (INDEPENDENT_AMBULATORY_CARE_PROVIDER_SITE_OTHER): Payer: Self-pay | Admitting: Physician Assistant

## 2020-07-09 DIAGNOSIS — E559 Vitamin D deficiency, unspecified: Secondary | ICD-10-CM

## 2020-07-10 ENCOUNTER — Other Ambulatory Visit (INDEPENDENT_AMBULATORY_CARE_PROVIDER_SITE_OTHER): Payer: Self-pay | Admitting: Family Medicine

## 2020-07-10 DIAGNOSIS — R7303 Prediabetes: Secondary | ICD-10-CM

## 2020-07-12 ENCOUNTER — Encounter (HOSPITAL_BASED_OUTPATIENT_CLINIC_OR_DEPARTMENT_OTHER): Payer: Self-pay | Admitting: Emergency Medicine

## 2020-07-12 ENCOUNTER — Other Ambulatory Visit: Payer: Self-pay

## 2020-07-12 ENCOUNTER — Emergency Department (HOSPITAL_BASED_OUTPATIENT_CLINIC_OR_DEPARTMENT_OTHER)
Admission: EM | Admit: 2020-07-12 | Discharge: 2020-07-12 | Disposition: A | Discharge status: Home / Self Care | Payer: Medicare Other | Attending: Emergency Medicine | Admitting: Emergency Medicine

## 2020-07-12 ENCOUNTER — Encounter (INDEPENDENT_AMBULATORY_CARE_PROVIDER_SITE_OTHER): Payer: Self-pay

## 2020-07-12 DIAGNOSIS — Z79899 Other long term (current) drug therapy: Secondary | ICD-10-CM | POA: Insufficient documentation

## 2020-07-12 DIAGNOSIS — M545 Low back pain, unspecified: Secondary | ICD-10-CM | POA: Diagnosis not present

## 2020-07-12 DIAGNOSIS — N182 Chronic kidney disease, stage 2 (mild): Secondary | ICD-10-CM | POA: Insufficient documentation

## 2020-07-12 DIAGNOSIS — J4540 Moderate persistent asthma, uncomplicated: Secondary | ICD-10-CM | POA: Diagnosis not present

## 2020-07-12 DIAGNOSIS — R7303 Prediabetes: Secondary | ICD-10-CM | POA: Diagnosis not present

## 2020-07-12 DIAGNOSIS — I129 Hypertensive chronic kidney disease with stage 1 through stage 4 chronic kidney disease, or unspecified chronic kidney disease: Secondary | ICD-10-CM | POA: Diagnosis not present

## 2020-07-12 DIAGNOSIS — M5459 Other low back pain: Secondary | ICD-10-CM | POA: Diagnosis not present

## 2020-07-12 LAB — URINALYSIS, ROUTINE W REFLEX MICROSCOPIC
Bilirubin Urine: NEGATIVE
Glucose, UA: NEGATIVE mg/dL
Ketones, ur: NEGATIVE mg/dL
Leukocytes,Ua: NEGATIVE
Nitrite: NEGATIVE
Protein, ur: NEGATIVE mg/dL
Specific Gravity, Urine: 1.025 (ref 1.005–1.030)
pH: 6 (ref 5.0–8.0)

## 2020-07-12 LAB — URINALYSIS, MICROSCOPIC (REFLEX)

## 2020-07-12 MED ORDER — DEXAMETHASONE SODIUM PHOSPHATE 10 MG/ML IJ SOLN
10.0000 mg | Freq: Once | INTRAMUSCULAR | Status: AC
  Administered 2020-07-12: 10 mg via INTRAMUSCULAR
  Filled 2020-07-12: qty 1

## 2020-07-12 MED ORDER — METHYLPREDNISOLONE 4 MG PO TBPK: ORAL_TABLET | ORAL | 0 refills | Status: DC

## 2020-07-12 MED ORDER — CYCLOBENZAPRINE HCL 5 MG PO TABS: 5.0000 mg | ORAL_TABLET | Freq: Two times a day (BID) | ORAL | 0 refills | Status: DC | PRN

## 2020-07-12 MED ORDER — HYDROCODONE-ACETAMINOPHEN 5-325 MG PO TABS
1.0000 | ORAL_TABLET | Freq: Once | ORAL | Status: AC
  Administered 2020-07-12: 1 via ORAL
  Filled 2020-07-12: qty 1

## 2020-07-12 NOTE — Discharge Instructions (Addendum)
You were seen today for back pain.  This is likely musculoskeletal in nature.  Take medications as prescribed.  Do not drive while taking Flexeril.

## 2020-07-12 NOTE — ED Triage Notes (Addendum)
R flank pain x 3 days. No injury that she is aware of. CKD II, no hx of stones. Pain worse with lifting R leg. No urinary complaints, no fever. Denies recent strenuous activity.

## 2020-07-12 NOTE — ED Provider Notes (Signed)
[facility] EMERGENCY DEPARTMENT Provider Note   CSN: [id number] Arrival date & time: 07/12/20  0444     History No chief complaint on file.   Joy [name] is a 54 y.o. female.  HPI     This a 54 year old female with a history obesity, hypertension, hypercholesterolemia, rheumatoid arthritis who presents with right-sided back pain. Patient reports 2 to 3-day history of worsening right-sided back pain. It is worse when lifting her leg and her foot and positions. Patient reports that she has taken over-the-counter pain medication with minimal relief. She currently rates her pain at 9 out of 10. The pain does not radiate. Denies weakness, numbness, tingling of the lower extremities, difficulty with her bowel or bladder. No known history of kidney stones and no noted hematuria. Denies nausea, vomiting, fevers. Denies systemic symptoms. Denies heavy lifting or injury.  Past Medical History:  Diagnosis Date  . Asthma   . Derangement of medial meniscus   . Folic acid deficiency   . High cholesterol   . Hypertension   . Joint pain   . 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 Meningitis 2010   hospitalized  . Obesity   . Osteoarthritis 2015  . Pernicious anemia   . Rheumatoid arthritis ([place]) 2015  . Steatosis of liver   . Vitamin D deficiency     Patient Active Problem List   Diagnosis Date Noted  . Prediabetes 04/27/2020  . Insulin resistance 03/03/2020  . Vitamin D deficiency 02/03/2020  . Hyperlipidemia 10/17/2019  . Essential hypertension 10/17/2019  . Chronic kidney disease 10/17/2019  . Moderate persistent asthma 12/07/2015  . Dermatitis, contact 12/07/2015  . Allergic rhinitis due to pollen 12/07/2015  . Rheumatoid arthritis ([place]) 12/07/2015  . Folate deficiency 11/20/2014  . Fatty liver 11/20/2014  . Routine general medical examination at a health care facility 09/26/2013  . ANEMIA, PERNICIOUS 07/05/2010  . Class 3 severe  obesity with serious comorbidity and body mass index (BMI) of 45.0 to 49.9 in adult [facility]) 07/01/2010    Past Surgical History:  Procedure Laterality Date  . ABDOMINAL HYSTERECTOMY  1994   partial  . BREAST SURGERY  2003   BIOPSY LEFT BREAST--BENIGN  . FOOT SURGERY Right   . KNEE SURGERY  2017  . TONSILLECTOMY       OB History    Gravida  3   Para  0   Term  0   Preterm  0   AB  0   Living  3     SAB  0   TAB  0   Ectopic  0   Multiple  0   Live Births  0           Family History  Problem Relation Age of Onset  . Stroke Mother   . Blindness Mother        in left eye due to stroke  . Glaucoma Mother        caused blindness of right eye  . Dementia Mother        died at 68  . Cancer Maternal Aunt 60       ovarian  . Cancer Maternal Grandmother 60       colon  . Diabetes Maternal Aunt   . Diabetes Maternal Aunt     Social History   Tobacco Use  . Smoking status: Never Smoker  . Smokeless tobacco: Never Used  Vaping Use  . Vaping Use:  Never used  Substance Use Topics  . Alcohol use: No  . Drug use: No    Home Medications Prior to Admission medications   Medication Sig Start Date End Date Taking? Authorizing Provider  albuterol (PROAIR HFA) 108 (90 Base) MCG/ACT inhaler Inhale 2 puffs into the lungs every 4 (four) hours as needed for wheezing or shortness of breath. 12/07/15   Charlies Silvers, MD  atorvastatin (LIPITOR) 20 MG tablet TAKE 1 TABLET BY MOUTH  DAILY 03/23/20   Debbrah Alar, NP  beclomethasone (QVAR) 40 MCG/ACT inhaler TWO PUFFS TWICE A DAY TO PREVENT COUGH OR WHEEZE. RINSE, GARGLE AND SPIT AFTER USE 12/14/15   Charlies Silvers, MD  cetirizine (ZYRTEC ALLERGY) 10 MG tablet Take 1 tablet (10 mg total) by mouth daily. 11/15/16   Julianne Rice, MD  cyclobenzaprine (FLEXERIL) 5 MG tablet Take 1 tablet (5 mg total) by mouth 2 (two) times daily as needed for muscle spasms. 07/12/20   Dilyn Smiles, Barbette Hair, MD  fluticasone (FLONASE) 50  MCG/ACT nasal spray Place 2 sprays into both nostrils daily. 11/15/16   Julianne Rice, MD  fluticasone (FLOVENT HFA) 44 MCG/ACT inhaler Inhale 2 puffs into the lungs once. 12/16/15   Leda Roys, MD  folic acid (FOLVITE) 1 MG tablet Take 1 tablet (1 mg total) by mouth daily. 11/20/19   Debbrah Alar, NP  hydroxychloroquine (PLAQUENIL) 200 MG tablet Take by mouth daily.    [provider]  Insulin Pen Needle (BD PEN NEEDLE NANO 2ND GEN) 32G X 4 MM MISC Use 1 needle daily to inject Victoza. 04/26/20   [place], Joneen Boers, FNP  liraglutide (VICTOZA) 18 MG/3ML SOPN Inject 1.2 mg into the skin daily. 05/11/20   Laqueta Linden, MD  lisinopril (ZESTRIL) 5 MG tablet Take 1 tablet (5 mg total) by mouth daily. 02/26/20   [place], Joneen Boers, FNP  methotrexate (RHEUMATREX) 2.5 MG tablet Take 15 mg by mouth once a week. Caution:Chemotherapy. Protect from light.    [provider]  methylPREDNISolone (MEDROL DOSEPAK) 4 MG TBPK tablet Take as directed on packet 07/12/20   Tagan Bartram, Barbette Hair, MD  Multiple Vitamins-Minerals (WOMENS MULTIVITAMIN PO) Take by mouth.    [provider]  Vitamin D, Ergocalciferol, (DRISDOL) 1.25 MG (50000 UNIT) CAPS capsule Take 1 capsule (50,000 Units total) by mouth every 7 (seven) days. 06/09/20   Abby Potash, PA-C    Allergies    Patient has no known allergies.  Review of Systems   Review of Systems  Constitutional: Negative for fever.  Respiratory: Negative for shortness of breath.   Cardiovascular: Negative for chest pain.  Gastrointestinal: Negative for abdominal pain, nausea and vomiting.  Genitourinary: Negative for difficulty urinating and hematuria.  Musculoskeletal: Positive for back pain.  Neurological: Negative for weakness and numbness.  All other systems reviewed and are negative.   Physical Exam Updated Vital Signs BP 135/87 (BP Location: Right Arm)   Pulse 98   Temp 98 F (36.7 C) (Oral)   Resp 20   Ht 1.626 m (5\' 4" )    Wt 120.7 kg   SpO2 100% Comment: Simultaneous filing. User may not have seen previous data.  BMI 45.66 kg/m   Physical Exam Vitals and nursing note reviewed.  Constitutional:      Appearance: She is well-developed. She is obese. She is not ill-appearing.  HENT:     Head: Normocephalic and atraumatic.     Nose: Nose normal.     Mouth/Throat:     Mouth:  Mucous membranes are moist.  Eyes:     Pupils: Pupils are equal, round, and reactive to light.  Cardiovascular:     Rate and Rhythm: Normal rate and regular rhythm.  Pulmonary:     Effort: Pulmonary effort is normal. No respiratory distress.     Breath sounds: No wheezing.  Abdominal:     Palpations: Abdomen is soft.     Tenderness: There is no abdominal tenderness.  Musculoskeletal:     Cervical back: Neck supple.     Comments: Tenderness palpation right paraspinous muscle region of the lower lumbar spine, no midline tenderness palpation, step-off, deformity, negative straight leg raise  Skin:    General: Skin is warm and dry.  Neurological:     Mental Status: She is alert and oriented to person, place, and time.     Comments: 5 out of 5 strength bilateral lower extremities, no clonus  Psychiatric:        Mood and Affect: Mood normal.     ED Results / Procedures / Treatments   Labs (all labs ordered are listed, but only abnormal results are displayed) Labs Reviewed  URINALYSIS, ROUTINE W REFLEX MICROSCOPIC - Abnormal; Notable for the following components:      Result Value   Hgb urine dipstick MODERATE (*)    All other components within normal limits  URINALYSIS, MICROSCOPIC (REFLEX) - Abnormal; Notable for the following components:   Bacteria, UA FEW (*)    All other components within normal limits    EKG None  Radiology No results found.  Procedures Procedures (including critical care time)  Medications Ordered in ED Medications  HYDROcodone-acetaminophen (NORCO/VICODIN) 5-325 MG per tablet 1 tablet (1  tablet Oral Given 07/12/20 0555)  dexamethasone (DECADRON) injection 10 mg (10 mg Intramuscular Given 07/12/20 0555)    ED Course  I have reviewed the triage vital signs and the nursing notes.  Pertinent labs & imaging results that were available during my care of the patient were reviewed by me and considered in my medical decision making (see chart for details).    MDM Rules/Calculators/A&P                          Patient presents with back pain.  She is overall nontoxic-appearing and vital signs are reassuring.  She has reproducible tenderness on exam.  No signs or symptoms of cauda equina.  She is neurovascularly intact.  Considerations include but not limited to, musculoskeletal etiology, doubt sciatica.  UTI is also consideration.  Presentation does not appear consistent with kidney stones.  Urinalysis without evidence of UTI.  Patient improved with hydrocodone and Decadron IM.  She remains neurovascularly intact.  No red flags.  Will discharge with Flexeril and a Medrol Dosepak given her history of chronic kidney disease.  After history, exam, and medical workup I feel the patient has been appropriately medically screened and is safe for discharge home. Pertinent diagnoses were discussed with the patient. Patient was given return precautions.  Final Clinical Impression(s) / ED Diagnoses Final diagnoses:  Acute right-sided low back pain without sciatica    Rx / DC Orders ED Discharge Orders         Ordered    cyclobenzaprine (FLEXERIL) 5 MG tablet  2 times daily PRN        07/12/20 0624    methylPREDNISolone (MEDROL DOSEPAK) 4 MG TBPK tablet        07/12/20 1062  Merryl Hacker, MD 07/12/20 [phone]

## 2020-07-12 NOTE — Telephone Encounter (Signed)
Last OV with Dr Jearld Shines Message sent to pt-CAS

## 2020-07-21 ENCOUNTER — Other Ambulatory Visit: Payer: Self-pay

## 2020-07-21 ENCOUNTER — Encounter (INDEPENDENT_AMBULATORY_CARE_PROVIDER_SITE_OTHER): Payer: Self-pay | Admitting: Family Medicine

## 2020-07-21 ENCOUNTER — Ambulatory Visit (INDEPENDENT_AMBULATORY_CARE_PROVIDER_SITE_OTHER): Payer: Medicare Other | Admitting: Family Medicine

## 2020-07-21 VITALS — BP 112/78 | HR 109 | Temp 97.9°F | Ht 64.0 in | Wt 252.0 lb

## 2020-07-21 DIAGNOSIS — Z6841 Body Mass Index (BMI) 40.0 and over, adult: Secondary | ICD-10-CM

## 2020-07-21 DIAGNOSIS — E559 Vitamin D deficiency, unspecified: Secondary | ICD-10-CM

## 2020-07-21 DIAGNOSIS — R7303 Prediabetes: Secondary | ICD-10-CM

## 2020-07-21 MED ORDER — BD PEN NEEDLE NANO 2ND GEN 32G X 4 MM MISC: 1.0000 | Freq: Two times a day (BID) | 0 refills | Status: DC

## 2020-07-21 MED ORDER — VITAMIN D (ERGOCALCIFEROL) 1.25 MG (50000 UNIT) PO CAPS: 50000.0000 [IU] | ORAL_CAPSULE | ORAL | 0 refills | Status: DC

## 2020-07-21 MED ORDER — VICTOZA 18 MG/3ML ~~LOC~~ SOPN: 1.2000 mg | PEN_INJECTOR | Freq: Every day | SUBCUTANEOUS | 0 refills | Status: DC

## 2020-07-22 NOTE — Progress Notes (Signed)
Chief Complaint:   OBESITY Joy [name] is here to discuss her progress with her obesity treatment plan along with follow-up of her obesity related diagnoses. Joy [name] is on the Category 1 Plan and states she is following her eating plan approximately 95% of the time. Joy [name] states she is doing 0 minutes 0 times per week.  Today's visit was #: 79 Starting weight: 268 lbs Starting date: 12/16/2019 Today's weight: 252 lbs Today's date: 07/21/2020 Total lbs lost to date: 16 Total lbs lost since last in-office visit: 10  Interim History: Joy [name] enjoyed small portions on Thanksgiving. She has been following the plan except for Thanksgiving. Eating because she knows she has to. She has no plans for the rest of the holiday season except seeing her grandkids. She denies hunger or cravings.  Subjective:   1. Pre-diabetes Joy [name]'s last A1c was 5.7 and insulin 24.0. She is on Victoza 1.2 mg and she denies GI side effects.  2. Vitamin D deficiency Joy [name] is on prescription Vit D, and she denies nausea, vomiting, or muscle weakness but she notes fatigue.  Assessment/Plan:   1. Pre-diabetes Joy [name] will continue to work on weight loss, exercise, and decreasing simple carbohydrates to help decrease the risk of diabetes. We will refill Victoza for 1 month, and we will refill nano needles #100 with no refills.  - Insulin Pen Needle (BD PEN NEEDLE NANO 2ND GEN) 32G X 4 MM MISC; 1 Package by Does not apply route 2 (two) times daily.  Dispense: 100 each; Refill: 0 - liraglutide (VICTOZA) 18 MG/3ML SOPN; Inject 1.2 mg into the skin daily.  Dispense: 12 mL; Refill: 0  2. Vitamin D deficiency Low Vitamin D level contributes to fatigue and are associated with obesity, breast, and colon cancer. We will refill prescription Vitamin D for 1 month. Joy [name] will follow-up for routine testing of Vitamin D, at least 2-3 times per year to avoid over-replacement.  - Vitamin D, Ergocalciferol,  (DRISDOL) 1.25 MG (50000 UNIT) CAPS capsule; Take 1 capsule (50,000 Units total) by mouth every 7 (seven) days.  Dispense: 4 capsule; Refill: 0  3. Class 3 severe obesity with serious comorbidity and body mass index (BMI) of 40.0 to 44.9 in adult, unspecified obesity type (HCC) Joy [name] is currently in the action stage of change. As such, her goal is to continue with weight loss efforts. She has agreed to the Category 1 Plan.   Exercise goals: No exercise has been prescribed at this time.  Behavioral modification strategies: increasing lean protein intake.  Joy [name] has agreed to follow-up with our clinic in 4 weeks. She was informed of the importance of frequent follow-up visits to maximize her success with intensive lifestyle modifications for her multiple health conditions.   Objective:   Blood pressure 112/78, pulse (!) 109, temperature 97.9 F (36.6 C), temperature source Oral, height 5\' 4"  (1.626 m), weight 252 lb (114.3 kg), SpO2 98 %. Body mass index is 43.26 kg/m.  General: Cooperative, alert, well developed, in no acute distress. HEENT: Conjunctivae and lids unremarkable. Cardiovascular: Regular rhythm.  Lungs: Normal work of breathing. Neurologic: No focal deficits.   Lab Results  Component Value Date   CREATININE 1.04 (H) 04/08/2020   BUN 15 04/08/2020   NA 143 04/08/2020   K 4.6 04/08/2020   CL 106 04/08/2020   CO2 26 04/08/2020   Lab Results  Component Value Date   ALT 22 04/08/2020   AST 24 04/08/2020   ALKPHOS 105 04/08/2020   BILITOT 0.5  04/08/2020   Lab Results  Component Value Date   HGBA1C 5.7 (H) 04/08/2020   HGBA1C 5.6 12/16/2019   Lab Results  Component Value Date   INSULIN 24.0 04/08/2020   INSULIN 11.8 12/16/2019   Lab Results  Component Value Date   TSH 2.100 12/16/2019   Lab Results  Component Value Date   CHOL 139 04/08/2020   HDL 44 04/08/2020   LDLCALC 80 04/08/2020   TRIG 76 04/08/2020   CHOLHDL 5 11/25/2019   Lab Results   Component Value Date   WBC 6.5 12/16/2019   HGB 11.4 12/16/2019   HCT 35.2 12/16/2019   MCV 93 12/16/2019   PLT 277 12/16/2019   Lab Results  Component Value Date   IRON 46 11/25/2019   TIBC 252 09/26/2013   FERRITIN 242.6 11/25/2019    Obesity Behavioral Intervention:   Approximately 15 minutes were spent on the discussion below.  ASK: We discussed the diagnosis of obesity with Joy [name] today and Joy [name] agreed to give [name] permission to discuss obesity behavioral modification therapy today.  ASSESS: Joy [name] has the diagnosis of obesity and her BMI today is 43.23. Joy [name] is in the action stage of change.   ADVISE: Joy [name] was educated on the multiple health risks of obesity as well as the benefit of weight loss to improve her health. She was advised of the need for long term treatment and the importance of lifestyle modifications to improve her current health and to decrease her risk of future health problems.  AGREE: Multiple dietary modification options and treatment options were discussed and Joy [name] agreed to follow the recommendations documented in the above note.  ARRANGE: Joy [name] was educated on the importance of frequent visits to treat obesity as outlined per CMS and USPSTF guidelines and agreed to schedule her next follow up appointment today.  Attestation Statements:   Reviewed by clinician on day of visit: allergies, medications, problem list, medical history, surgical history, family history, social history, and previous encounter notes.   I, Joy [name], am acting as transcriptionist for Coralie Common, MD.  I have reviewed the above documentation for accuracy and completeness, and I agree with the above. - Jinny Blossom, MD

## 2020-08-09 ENCOUNTER — Encounter (HOSPITAL_BASED_OUTPATIENT_CLINIC_OR_DEPARTMENT_OTHER): Payer: Self-pay | Admitting: *Deleted

## 2020-08-09 ENCOUNTER — Emergency Department (HOSPITAL_BASED_OUTPATIENT_CLINIC_OR_DEPARTMENT_OTHER)
Admission: EM | Admit: 2020-08-09 | Discharge: 2020-08-09 | Disposition: A | Discharge status: Home / Self Care | Payer: Medicare Other | Attending: Emergency Medicine | Admitting: Emergency Medicine

## 2020-08-09 ENCOUNTER — Other Ambulatory Visit: Payer: Self-pay

## 2020-08-09 DIAGNOSIS — Z20822 Contact with and (suspected) exposure to covid-19: Secondary | ICD-10-CM | POA: Insufficient documentation

## 2020-08-09 DIAGNOSIS — Z5321 Procedure and treatment not carried out due to patient leaving prior to being seen by health care provider: Secondary | ICD-10-CM | POA: Insufficient documentation

## 2020-08-09 DIAGNOSIS — R059 Cough, unspecified: Secondary | ICD-10-CM | POA: Insufficient documentation

## 2020-08-09 LAB — RESP PANEL BY RT-PCR (FLU A&B, COVID) ARPGX2
Influenza A by PCR: NEGATIVE
Influenza B by PCR: NEGATIVE
SARS Coronavirus 2 by RT PCR: NEGATIVE

## 2020-08-09 NOTE — ED Triage Notes (Signed)
Cough x 2 days

## 2020-08-10 ENCOUNTER — Encounter: Payer: Self-pay | Admitting: Family Medicine

## 2020-08-10 ENCOUNTER — Telehealth (INDEPENDENT_AMBULATORY_CARE_PROVIDER_SITE_OTHER): Payer: Medicare Other | Admitting: Family Medicine

## 2020-08-10 VITALS — BP 134/79 | HR 99 | Temp 98.6°F

## 2020-08-10 DIAGNOSIS — J069 Acute upper respiratory infection, unspecified: Secondary | ICD-10-CM | POA: Diagnosis not present

## 2020-08-10 DIAGNOSIS — J4521 Mild intermittent asthma with (acute) exacerbation: Secondary | ICD-10-CM | POA: Diagnosis not present

## 2020-08-10 MED ORDER — BECLOMETHASONE DIPROPIONATE 40 MCG/ACT IN AERS: INHALATION_SPRAY | RESPIRATORY_TRACT | 5 refills | Status: DC

## 2020-08-10 MED ORDER — PREDNISONE 20 MG PO TABS: ORAL_TABLET | ORAL | 0 refills | Status: DC

## 2020-08-10 MED ORDER — PROMETHAZINE-DM 6.25-15 MG/5ML PO SYRP: 5.0000 mL | ORAL_SOLUTION | Freq: Four times a day (QID) | ORAL | 0 refills | Status: DC | PRN

## 2020-08-10 NOTE — Progress Notes (Signed)
Chief Complaint  Patient presents with  . Cough    Congestion     Joy [name] here for URI complaints. Due to COVID-19 pandemic, we are interacting via web portal for an electronic face-to-face visit. I verified patient's ID using 2 identifiers. Patient agreed to proceed with visit via this method. Patient is at home, I am at office. Patient and I are present for visit.   Duration: 3 days  Associated symptoms: wheezing, shortness of breath and cough, chest congestion Denies: sinus congestion, sinus pain, rhinorrhea, itchy watery eyes, ear pain, ear drainage, sore throat, myalgia and fevers, N/V/D Treatment to date: Mucinex, Theraflu Sick contacts: Yes - kids started Covid neg in ED, left without being evaluated.   Past Medical History:  Diagnosis Date  . Asthma   . Derangement of medial meniscus   . Folic acid deficiency   . High cholesterol   . Hypertension   . Joint pain   . 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 Meningitis 2010   hospitalized  . Obesity   . Osteoarthritis 2015  . Pernicious anemia   . Rheumatoid arthritis (HCC) 2015  . Steatosis of liver   . Vitamin D deficiency     BP 134/79 (BP Location: Left Arm, Patient Position: Sitting, Cuff Size: Normal)   Pulse 99   Temp 98.6 F (37 C) (Oral)  No conversational dyspnea Age appropriate judgment and insight Nml affect and mood  Viral URI with cough  Mild intermittent asthma with exacerbation  1. Syrup as above. Warned about drowsiness and to take at night first, if does not make drowsy, ok to take during day. Continue to push fluids, practice good hand hygiene, cover mouth when coughing. F/u prn. If starting to experience fevers, shaking, or shortness of breath, seek immediate care. 2. Pred burst as contingency, but will have her add back ICS during this mild flare. F/u prn for this.  Pt voiced understanding and agreement to the plan.  Jilda Roche [place], DO 08/10/20 10:48  AM

## 2020-08-11 ENCOUNTER — Telehealth: Payer: Self-pay

## 2020-08-11 NOTE — Telephone Encounter (Signed)
PA approved.   Request Reference Number: [id number]. QVAR REDIHAL AER is approved through 08/13/2021. Your patient may now fill this prescription and it will be covered.

## 2020-08-11 NOTE — Telephone Encounter (Signed)
PA initiated via Covermymeds; KEY: [id number]. Awaiting determination.

## 2020-08-13 ENCOUNTER — Encounter (HOSPITAL_BASED_OUTPATIENT_CLINIC_OR_DEPARTMENT_OTHER): Payer: Self-pay | Admitting: Emergency Medicine

## 2020-08-13 ENCOUNTER — Other Ambulatory Visit: Payer: Self-pay

## 2020-08-13 ENCOUNTER — Emergency Department (HOSPITAL_BASED_OUTPATIENT_CLINIC_OR_DEPARTMENT_OTHER)
Admission: EM | Admit: 2020-08-13 | Discharge: 2020-08-13 | Disposition: A | Discharge status: Home / Self Care | Payer: Medicare Other | Attending: Emergency Medicine | Admitting: Emergency Medicine

## 2020-08-13 ENCOUNTER — Other Ambulatory Visit (HOSPITAL_BASED_OUTPATIENT_CLINIC_OR_DEPARTMENT_OTHER): Payer: Self-pay | Admitting: Emergency Medicine

## 2020-08-13 ENCOUNTER — Emergency Department (HOSPITAL_BASED_OUTPATIENT_CLINIC_OR_DEPARTMENT_OTHER): Payer: Medicare Other

## 2020-08-13 DIAGNOSIS — B9789 Other viral agents as the cause of diseases classified elsewhere: Secondary | ICD-10-CM | POA: Diagnosis not present

## 2020-08-13 DIAGNOSIS — N182 Chronic kidney disease, stage 2 (mild): Secondary | ICD-10-CM | POA: Insufficient documentation

## 2020-08-13 DIAGNOSIS — Z79899 Other long term (current) drug therapy: Secondary | ICD-10-CM | POA: Diagnosis not present

## 2020-08-13 DIAGNOSIS — I129 Hypertensive chronic kidney disease with stage 1 through stage 4 chronic kidney disease, or unspecified chronic kidney disease: Secondary | ICD-10-CM | POA: Diagnosis not present

## 2020-08-13 DIAGNOSIS — Z7951 Long term (current) use of inhaled steroids: Secondary | ICD-10-CM | POA: Insufficient documentation

## 2020-08-13 DIAGNOSIS — E1122 Type 2 diabetes mellitus with diabetic chronic kidney disease: Secondary | ICD-10-CM | POA: Diagnosis not present

## 2020-08-13 DIAGNOSIS — R0602 Shortness of breath: Secondary | ICD-10-CM | POA: Diagnosis not present

## 2020-08-13 DIAGNOSIS — J4521 Mild intermittent asthma with (acute) exacerbation: Secondary | ICD-10-CM

## 2020-08-13 DIAGNOSIS — R059 Cough, unspecified: Secondary | ICD-10-CM | POA: Diagnosis not present

## 2020-08-13 DIAGNOSIS — J4540 Moderate persistent asthma, uncomplicated: Secondary | ICD-10-CM | POA: Insufficient documentation

## 2020-08-13 DIAGNOSIS — J069 Acute upper respiratory infection, unspecified: Secondary | ICD-10-CM | POA: Insufficient documentation

## 2020-08-13 DIAGNOSIS — Z794 Long term (current) use of insulin: Secondary | ICD-10-CM | POA: Diagnosis not present

## 2020-08-13 MED ORDER — BECLOMETHASONE DIPROPIONATE 40 MCG/ACT IN AERS: INHALATION_SPRAY | RESPIRATORY_TRACT | 5 refills | Status: DC

## 2020-08-13 MED ORDER — ALBUTEROL SULFATE HFA 108 (90 BASE) MCG/ACT IN AERS
INHALATION_SPRAY | RESPIRATORY_TRACT | Status: AC
  Administered 2020-08-13: 2
  Filled 2020-08-13: qty 6.7

## 2020-08-13 MED ORDER — METHYLPREDNISOLONE 4 MG PO TBPK: ORAL_TABLET | ORAL | 0 refills | Status: DC

## 2020-08-13 MED FILL — METHYLPREDNISOLONE 4 MG TBP: 4 | 6 days supply | Qty: 21 | Fill #0

## 2020-08-13 MED FILL — QVAR REDIHALER 40 MCG/ACT A: 40 | 30 days supply | Qty: 11 | Fill #0

## 2020-08-13 NOTE — ED Provider Notes (Signed)
[facility] EMERGENCY DEPARTMENT Provider Note   CSN: [id number] Arrival date & time: 08/13/20  [id number]     History Chief Complaint  Patient presents with  . Cough    Joy [name] is a 54 y.o. female.  The history is provided by the patient and medical records.  Cough  Joy [name] is a 54 y.o. female who presents to the Emergency Department complaining of cough.  She presents to the ED complaining of cough/sob.  Sxs started on 12/23.  She went to [facility] on 12/26 and had negative COVID 19 test but LWBS. She had a sick contact with URI sxs.  Cough is minimally productive.  Denies fever, chest pain, abdominal pain, vomiting, diarrhea, leg swelling/pain.    Has a hx/o asthma, RA, CKD stage II, HTN, DM.  She has been fully vaccinated and boosted for COVID 19.    She did an e-visit and was started on steroids three days ago.  She does not feel any significant improvement in sxs since starting the steroids.   She is supposed to take qvar daily but it was not available at the pharmacy.  She has an albuterol MDI that she has been using, gets some relief.      Past Medical History:  Diagnosis Date  . Asthma   . Derangement of medial meniscus   . Folic acid deficiency   . High cholesterol   . Hypertension   . Joint pain   . 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 Meningitis 2010   hospitalized  . Obesity   . Osteoarthritis 2015  . Pernicious anemia   . Rheumatoid arthritis ([place]) 2015  . Steatosis of liver   . Vitamin D deficiency     Patient Active Problem List   Diagnosis Date Noted  . Prediabetes 04/27/2020  . Insulin resistance 03/03/2020  . Vitamin D deficiency 02/03/2020  . Hyperlipidemia 10/17/2019  . Essential hypertension 10/17/2019  . Chronic kidney disease 10/17/2019  . Moderate persistent asthma 12/07/2015  . Dermatitis, contact 12/07/2015  . Allergic rhinitis due to pollen 12/07/2015  . Rheumatoid arthritis ([place])  12/07/2015  . Folate deficiency 11/20/2014  . Fatty liver 11/20/2014  . Routine general medical examination at a health care facility 09/26/2013  . ANEMIA, PERNICIOUS 07/05/2010  . Class 3 severe obesity with serious comorbidity and body mass index (BMI) of 45.0 to 49.9 in adult [facility]) 07/01/2010    Past Surgical History:  Procedure Laterality Date  . ABDOMINAL HYSTERECTOMY  1994   partial  . BREAST SURGERY  2003   BIOPSY LEFT BREAST--BENIGN  . FOOT SURGERY Right   . KNEE SURGERY  2017  . TONSILLECTOMY       OB History    Gravida  3   Para  0   Term  0   Preterm  0   AB  0   Living  3     SAB  0   IAB  0   Ectopic  0   Multiple  0   Live Births  0           Family History  Problem Relation Age of Onset  . Stroke Mother   . Blindness Mother        in left eye due to stroke  . Glaucoma Mother        caused blindness of right eye  . Dementia Mother        died at  83  . Cancer Maternal Aunt 60       ovarian  . Cancer Maternal Grandmother 60       colon  . Diabetes Maternal Aunt   . Diabetes Maternal Aunt     Social History   Tobacco Use  . Smoking status: Never Smoker  . Smokeless tobacco: Never Used  Vaping Use  . Vaping Use: Never used  Substance Use Topics  . Alcohol use: No  . Drug use: No    Home Medications Prior to Admission medications   Medication Sig Start Date End Date Taking? Authorizing Provider  albuterol (PROAIR HFA) 108 (90 Base) MCG/ACT inhaler Inhale 2 puffs into the lungs every 4 (four) hours as needed for wheezing or shortness of breath. 12/07/15  Yes Bardelas, Jens Som, MD  methylPREDNISolone (MEDROL DOSEPAK) 4 MG TBPK tablet Take according to label instructions 08/13/20  Yes Quintella Reichert, MD  atorvastatin (LIPITOR) 20 MG tablet TAKE 1 TABLET BY MOUTH  DAILY 03/23/20   Debbrah Alar, NP  beclomethasone (QVAR) 40 MCG/ACT inhaler TWO PUFFS TWICE A DAY TO PREVENT COUGH OR WHEEZE. RINSE, GARGLE AND SPIT AFTER USE  08/13/20   Quintella Reichert, MD  cetirizine (ZYRTEC ALLERGY) 10 MG tablet Take 1 tablet (10 mg total) by mouth daily. 11/15/16   Julianne Rice, MD  cyclobenzaprine (FLEXERIL) 5 MG tablet Take 1 tablet (5 mg total) by mouth 2 (two) times daily as needed for muscle spasms. 07/12/20   Horton, Barbette Hair, MD  fluticasone (FLONASE) 50 MCG/ACT nasal spray Place 2 sprays into both nostrils daily. 11/15/16   Julianne Rice, MD  folic acid (FOLVITE) 1 MG tablet Take 1 tablet (1 mg total) by mouth daily. 11/20/19   Debbrah Alar, NP  hydroxychloroquine (PLAQUENIL) 200 MG tablet Take by mouth daily.    [provider]  Insulin Pen Needle (BD PEN NEEDLE NANO 2ND GEN) 32G X 4 MM MISC Use 1 needle daily to inject Victoza. 04/26/20   Whitmire, Dawn W, FNP  Insulin Pen Needle (BD PEN NEEDLE NANO 2ND GEN) 32G X 4 MM MISC 1 Package by Does not apply route 2 (two) times daily. 07/21/20   Laqueta Linden, MD  liraglutide (VICTOZA) 18 MG/3ML SOPN Inject 1.2 mg into the skin daily. 07/21/20   Laqueta Linden, MD  lisinopril (ZESTRIL) 5 MG tablet Take 1 tablet (5 mg total) by mouth daily. 02/26/20   Whitmire, Joneen Boers, FNP  methotrexate (RHEUMATREX) 2.5 MG tablet Take 15 mg by mouth once a week. Caution:Chemotherapy. Protect from light.    [provider]  Multiple Vitamins-Minerals (WOMENS MULTIVITAMIN PO) Take by mouth.    [provider]  predniSONE (DELTASONE) 20 MG tablet For an asthma flare, take 2 tabs daily for 5 days. 08/10/20   Shelda Pal, DO  promethazine-dextromethorphan (PROMETHAZINE-DM) 6.25-15 MG/5ML syrup Take 5 mLs by mouth 4 (four) times daily as needed for cough. 08/10/20   Shelda Pal, DO  Vitamin D, Ergocalciferol, (DRISDOL) 1.25 MG (50000 UNIT) CAPS capsule Take 1 capsule (50,000 Units total) by mouth every 7 (seven) days. 07/21/20   Laqueta Linden, MD    Allergies    Patient has no known allergies.  Review of Systems   Review of  Systems  Respiratory: Positive for cough.   All other systems reviewed and are negative.   Physical Exam Updated Vital Signs BP 130/80 (BP Location: Right Arm)   Pulse 91   Temp 98.4 F (36.9 C) (Oral)  Resp 18   Ht 5\' 4"  (1.626 m)   Wt 117.5 kg   SpO2 96%   BMI 44.46 kg/m   Physical Exam Vitals and nursing note reviewed.  Constitutional:      Appearance: She is well-developed and well-nourished.  HENT:     Head: Normocephalic and atraumatic.  Cardiovascular:     Rate and Rhythm: Normal rate and regular rhythm.     Heart sounds: No murmur heard.   Pulmonary:     Effort: Pulmonary effort is normal. No respiratory distress.     Comments: Occasional end expiratory wheeze Abdominal:     Palpations: Abdomen is soft.     Tenderness: There is no abdominal tenderness. There is no guarding or rebound.  Musculoskeletal:        General: No swelling, tenderness or edema.  Skin:    General: Skin is warm and dry.  Neurological:     Mental Status: She is alert and oriented to person, place, and time.  Psychiatric:        Mood and Affect: Mood and affect normal.        Behavior: Behavior normal.     ED Results / Procedures / Treatments   Labs (all labs ordered are listed, but only abnormal results are displayed) Labs Reviewed - No data to display  EKG None  Radiology DG Chest Portable 1 View  Result Date: 08/13/2020 CLINICAL DATA:  54 year old female with cough. Shortness of breath. Tested negative for COVID-19 08/09/2020. EXAM: PORTABLE CHEST 1 VIEW COMPARISON:  Chest radiographs 02/04/2019 and earlier. FINDINGS: Portable AP upright view at 0834 hours. 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 Visualized tracheal air column is within normal limits. Allowing for portable technique the lungs are clear. No  pneumothorax. Negative visible bowel gas pattern and osseous structures. IMPRESSION: Negative portable chest. Electronically Signed   By: 2017 M.D.   On: 08/13/2020 08:50    Procedures Procedures (including critical care time)  Medications Ordered in ED Medications  albuterol (VENTOLIN HFA) 108 (90 Base) MCG/ACT inhaler (2 puffs  Given 08/13/20 0913)    ED Course  I have reviewed the triage vital signs and the nursing notes.  Pertinent labs & imaging results that were available during my care of the patient were reviewed by me and considered in my medical decision making (see chart for details).    MDM Rules/Calculators/A&P                         patient with history of asthma here for evaluation of increased shortness of breath, cough and wheezing for the last week. She does have occasional wheezes on examination without respiratory distress. Imaging is negative for pneumonia. Presentation is not consistent with PE, CHF. She does have improvement with albuterol. Plan to discharge home with home care for asthma exacerbation, likely secondary to viral exposure. Discussed outpatient follow-up and return precautions.  Final Clinical Impression(s) / ED Diagnoses Final diagnoses:  Viral URI with cough    Rx / DC Orders ED Discharge Orders         Ordered    beclomethasone (QVAR) 40 MCG/ACT inhaler        08/13/20 0954    methylPREDNISolone (MEDROL DOSEPAK) 4 MG TBPK tablet        08/13/20 0954  Quintella Reichert, MD 08/13/20 1019

## 2020-08-13 NOTE — ED Notes (Signed)
PCXR at bedside.

## 2020-08-13 NOTE — ED Triage Notes (Signed)
Cough since Sunday, reports short of breath when walking long distances. Seen virtually and given steriods.

## 2020-08-13 NOTE — Discharge Instructions (Addendum)
You may use the albuterol inhaler, 2 puffs every four hours as needed for cough/wheeze.

## 2020-08-16 ENCOUNTER — Other Ambulatory Visit (INDEPENDENT_AMBULATORY_CARE_PROVIDER_SITE_OTHER): Payer: Self-pay | Admitting: Family Medicine

## 2020-08-16 DIAGNOSIS — M0609 Rheumatoid arthritis without rheumatoid factor, multiple sites: Secondary | ICD-10-CM | POA: Diagnosis not present

## 2020-08-16 DIAGNOSIS — E559 Vitamin D deficiency, unspecified: Secondary | ICD-10-CM

## 2020-08-16 DIAGNOSIS — Z79899 Other long term (current) drug therapy: Secondary | ICD-10-CM | POA: Diagnosis not present

## 2020-08-16 DIAGNOSIS — M159 Polyosteoarthritis, unspecified: Secondary | ICD-10-CM | POA: Diagnosis not present

## 2020-08-16 NOTE — Telephone Encounter (Signed)
Dr Ukleja pt °

## 2020-08-18 ENCOUNTER — Telehealth (INDEPENDENT_AMBULATORY_CARE_PROVIDER_SITE_OTHER): Payer: Medicare Other | Admitting: Family Medicine

## 2020-08-18 ENCOUNTER — Other Ambulatory Visit (INDEPENDENT_AMBULATORY_CARE_PROVIDER_SITE_OTHER): Payer: Self-pay | Admitting: Family Medicine

## 2020-08-18 ENCOUNTER — Encounter (INDEPENDENT_AMBULATORY_CARE_PROVIDER_SITE_OTHER): Payer: Self-pay | Admitting: Family Medicine

## 2020-08-18 ENCOUNTER — Other Ambulatory Visit: Payer: Self-pay

## 2020-08-18 ENCOUNTER — Telehealth (INDEPENDENT_AMBULATORY_CARE_PROVIDER_SITE_OTHER): Payer: Self-pay

## 2020-08-18 DIAGNOSIS — Z6841 Body Mass Index (BMI) 40.0 and over, adult: Secondary | ICD-10-CM | POA: Diagnosis not present

## 2020-08-18 DIAGNOSIS — E7849 Other hyperlipidemia: Secondary | ICD-10-CM

## 2020-08-18 DIAGNOSIS — E559 Vitamin D deficiency, unspecified: Secondary | ICD-10-CM | POA: Diagnosis not present

## 2020-08-18 MED ORDER — VITAMIN D (ERGOCALCIFEROL) 1.25 MG (50000 UNIT) PO CAPS: 50000.0000 [IU] | ORAL_CAPSULE | ORAL | 0 refills | Status: DC

## 2020-08-18 MED ORDER — ATORVASTATIN CALCIUM 20 MG PO TABS: 20.0000 mg | ORAL_TABLET | Freq: Every day | ORAL | 0 refills | Status: DC

## 2020-08-18 NOTE — Telephone Encounter (Signed)
I connected with  Joy [name] on 08/18/20 by a video enabled telemedicine application and verified that I am speaking with the correct person using two identifiers.   I discussed the limitations of evaluation and management by telemedicine. The patient expressed understanding and agreed to proceed.

## 2020-08-19 NOTE — Telephone Encounter (Signed)
Last seen by Dr. Lawson Radar yesterday.

## 2020-08-30 NOTE — Progress Notes (Signed)
TeleHealth Visit:  Due to the COVID-19 pandemic, this visit was completed with telemedicine (audio/video) technology to reduce patient and provider exposure as well as to preserve personal protective equipment.   Joy [name] has verbally consented to this TeleHealth visit. The patient is located at home, the provider is located at the [facility] office. The participants in this visit include the listed provider and patient. The visit was conducted today via Mychart Video.  Chief Complaint: OBESITY Joy [name] is here to discuss her progress with her obesity treatment plan along with follow-up of her obesity related diagnoses. Joy [name] is on the Category 1 Plan and states she is following her eating plan approximately 100% of the time. Joy [name] states she is walking 30 minutes 3 to 4 times per week.  Today's visit was #: 14 Starting weight: 268 lbs Starting date: 12/16/19  Interim History: Joy [name] reports that she has been able to follow the plan 100%. 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 Joy [name] has had to make some substitutions as she hasn't felt well. She reports a weight of 258.6 pounds and a blood pressure of 123/84. Joy [name] would like to add a bit more physical activity and continue with Category 1 plan.   Subjective:   1. Vitamin D deficiency Joy [name]'s Vitamin D level was 26.2 on 04/08/20. She is currently taking prescription vitamin D 50,000 IU each week. She notes fatigue, and denies nausea, vomiting or muscle weakness.  2. Other hyperlipidemia Joy [name] has hyperlipidemia and is on atorvastatin daily. Her last LDL was 80, HDL was 44, and triglycerides were 76 on 04/08/20.Joy [name] has been trying to improve her cholesterol levels with intensive lifestyle modification including a low saturated fat diet, exercise and weight loss. She denies any chest pain, claudication or myalgias.  Lab  Results  Component Value Date   ALT 22 04/08/2020   AST 24 04/08/2020   ALKPHOS 105 04/08/2020   BILITOT 0.5 04/08/2020   Lab Results  Component Value Date   CHOL 139 04/08/2020   HDL 44 04/08/2020   LDLCALC 80 04/08/2020   TRIG 76 04/08/2020   CHOLHDL 5 11/25/2019    Assessment/Plan:   1. Vitamin D deficiency Low Vitamin D level contributes to fatigue and are associated with obesity, breast, and colon cancer. She agrees to continue to take prescription Vitamin D @50 ,000 IU every week #4 and will follow-up for routine testing of Vitamin D, at least 2-3 times per year to avoid over-replacement.  - Vitamin D, Ergocalciferol, (DRISDOL) 1.25 MG (50000 UNIT) CAPS capsule; Take 1 capsule (50,000 Units total) by mouth every 7 (seven) days.  Dispense: 4 capsule; Refill: 0  2. Other hyperlipidemia Cardiovascular risk and specific lipid/LDL goals reviewed.  We discussed several lifestyle modifications today and [name] will continue to work on diet, exercise, weight loss efforts, and continue taking atorvastatin. Orders and follow up as documented in patient record.   Counseling Intensive lifestyle modifications are the first line treatment for this issue. . Dietary changes: Increase soluble fiber. Decrease simple carbohydrates. . Exercise changes: Moderate to vigorous-intensity aerobic activity 150 minutes per week if tolerated. . Lipid-lowering medications: see documented in medical record.  - atorvastatin (LIPITOR) 20 MG tablet; Take 1 tablet (20 mg total) by mouth daily.  Dispense: 90 tablet; Refill: 0  3. Class 3 severe obesity with serious comorbidity and body mass index (BMI) of 40.0 to 44.9 in adult, unspecified obesity type (Joy [name])  Joy [name] is currently in the action stage of change. As such, her goal is to continue with weight loss efforts. She has agreed to the Category 1 Plan.   Exercise goals: As is.  Behavioral modification strategies: increasing lean protein intake,  meal planning and cooking strategies, keeping healthy foods in the home and planning for success.  Joy [name] has agreed to follow-up with our clinic in 2 weeks. She was informed of the importance of frequent follow-up visits to maximize her success with intensive lifestyle modifications for her multiple health conditions.  Objective:   VITALS: Per patient if applicable, see vitals. GENERAL: Alert and in no acute distress. CARDIOPULMONARY: No increased WOB. Speaking in clear sentences.  PSYCH: Pleasant and cooperative. Speech normal rate and rhythm. Affect is appropriate. Insight and judgement are appropriate. Attention is focused, linear, and appropriate.  NEURO: Oriented as arrived to appointment on time with no prompting.   Lab Results  Component Value Date   CREATININE 1.04 (H) 04/08/2020   BUN 15 04/08/2020   NA 143 04/08/2020   K 4.6 04/08/2020   CL 106 04/08/2020   CO2 26 04/08/2020   Lab Results  Component Value Date   ALT 22 04/08/2020   AST 24 04/08/2020   ALKPHOS 105 04/08/2020   BILITOT 0.5 04/08/2020   Lab Results  Component Value Date   HGBA1C 5.7 (H) 04/08/2020   HGBA1C 5.6 12/16/2019   Lab Results  Component Value Date   INSULIN 24.0 04/08/2020   INSULIN 11.8 12/16/2019   Lab Results  Component Value Date   TSH 2.100 12/16/2019   Lab Results  Component Value Date   CHOL 139 04/08/2020   HDL 44 04/08/2020   LDLCALC 80 04/08/2020   TRIG 76 04/08/2020   CHOLHDL 5 11/25/2019   Lab Results  Component Value Date   WBC 6.5 12/16/2019   HGB 11.4 12/16/2019   HCT 35.2 12/16/2019   MCV 93 12/16/2019   PLT 277 12/16/2019   Lab Results  Component Value Date   IRON 46 11/25/2019   TIBC 252 09/26/2013   FERRITIN 242.6 11/25/2019    Attestation Statements:   Reviewed by clinician on day of visit: allergies, medications, problem list, medical history, surgical history, family history, social history, and previous encounter notes.   IMarcille Blanco,  CMA, am acting as transcriptionist for Coralie Common, MD   I have reviewed the above documentation for accuracy and completeness, and I agree with the above. - Jinny Blossom, MD

## 2020-08-31 ENCOUNTER — Telehealth (INDEPENDENT_AMBULATORY_CARE_PROVIDER_SITE_OTHER): Payer: Medicare Other | Admitting: Family Medicine

## 2020-08-31 ENCOUNTER — Encounter (INDEPENDENT_AMBULATORY_CARE_PROVIDER_SITE_OTHER): Payer: Self-pay | Admitting: Family Medicine

## 2020-08-31 ENCOUNTER — Other Ambulatory Visit: Payer: Self-pay

## 2020-08-31 DIAGNOSIS — R7303 Prediabetes: Secondary | ICD-10-CM

## 2020-08-31 DIAGNOSIS — E559 Vitamin D deficiency, unspecified: Secondary | ICD-10-CM

## 2020-08-31 DIAGNOSIS — Z6841 Body Mass Index (BMI) 40.0 and over, adult: Secondary | ICD-10-CM

## 2020-09-02 DIAGNOSIS — Z79899 Other long term (current) drug therapy: Secondary | ICD-10-CM | POA: Diagnosis not present

## 2020-09-02 DIAGNOSIS — M0609 Rheumatoid arthritis without rheumatoid factor, multiple sites: Secondary | ICD-10-CM | POA: Diagnosis not present

## 2020-09-02 DIAGNOSIS — M159 Polyosteoarthritis, unspecified: Secondary | ICD-10-CM | POA: Diagnosis not present

## 2020-09-02 DIAGNOSIS — N182 Chronic kidney disease, stage 2 (mild): Secondary | ICD-10-CM | POA: Diagnosis not present

## 2020-09-06 NOTE — Progress Notes (Signed)
TeleHealth Visit:  Due to the COVID-19 pandemic, this visit was completed with telemedicine (audio/video) technology to reduce patient and provider exposure as well as to preserve personal protective equipment.   Joy [name] has verbally consented to this TeleHealth visit. The patient is located at home, the provider is located at the [facility] office. The participants in this visit include the listed provider and patient . Joy [name] was unable to use realtime audiovisual technology today and the telehealth visit was conducted via telephone.  Chief Complaint: OBESITY Joy [name] is here to discuss her progress with her obesity treatment plan along with follow-up of her obesity related diagnoses. Joy [name] is on the Category 1 Plan and states she is following her eating plan approximately 100% of the time. Joy [name] states she is doing chair yoga 45 minutes 2 times per week and walking for 60 minutes 2 times per week.  Today's visit was #: 15 Starting weight: 268 lbs Starting date: 12/16/2019  Interim History: Joy [name] has found following plan 100% has been easier the last few weeks. No increase in hunger with addition of exercise and got all food in. No obstacles coming up in next few weeks except tendency to skip meals.  Subjective:   1. Prediabetes Joy [name] has a diagnosis of prediabetes based on her elevated HgA1c and was informed this puts her at greater risk of developing diabetes. She continues to work on diet and exercise to decrease her risk of diabetes. She denies nausea or hypoglycemia. Joy [name]'s last A1c was 5.7 and insulin 24.0. Joy [name] is on Victoza with no side effects.  Lab Results  Component Value Date   HGBA1C 5.7 (H) 04/08/2020   Lab Results  Component Value Date   INSULIN 24.0 04/08/2020   INSULIN 11.8 12/16/2019    2. Vitamin D deficiency Joy [name]'s Vitamin D level was 26.2 on 04/08/2020 . She is currently taking prescription vitamin D 50,000  IU each week. She denies nausea, vomiting or muscle weakness.   Assessment/Plan:   1. Prediabetes Joy [name] will continue to work on weight loss, exercise, and decreasing simple carbohydrates to help decrease the risk of diabetes. Joy [name] will continue Victoza. No change in dose. We will increase dose if no weight change by next appointment.  2. Vitamin D deficiency Low Vitamin D level contributes to fatigue and are associated with obesity, breast, and colon cancer. She agrees to continue to take prescription Vitamin D @50 ,000 IU every week and will follow-up for routine testing of Vitamin D, at least 2-3 times per year to avoid over-replacement. Joy [name] will continue prescription Vitamin D. No refill needed.  3. Class 3 severe obesity with serious comorbidity and body mass index (BMI) of 45.0 to 49.9 in adult, unspecified obesity type ([facility])  Joy [name] is currently in the action stage of change. As such, her goal is to continue with weight loss efforts. She has agreed to the Category 1 Plan.   Exercise goals: No exercise has been prescribed at this time.  Behavioral modification strategies: increasing lean protein intake, no skipping meals, meal planning and cooking strategies and keeping healthy foods in the home.  Joy [name] has agreed to follow-up with our clinic in 2 weeks on 09/13/2020 at 4:00 pm. She was informed of the importance of frequent follow-up visits to maximize her success with intensive lifestyle modifications for her multiple health conditions.   Objective:   VITALS: Per patient if applicable, see vitals. GENERAL: Alert and in no acute distress. CARDIOPULMONARY: No increased WOB. Speaking in clear  sentences.  PSYCH: Pleasant and cooperative. Speech normal rate and rhythm. Affect is appropriate. Insight and judgement are appropriate. Attention is focused, linear, and appropriate.  NEURO: Oriented as arrived to appointment on time with no prompting.   Lab Results   Component Value Date   CREATININE 1.04 (H) 04/08/2020   BUN 15 04/08/2020   NA 143 04/08/2020   K 4.6 04/08/2020   CL 106 04/08/2020   CO2 26 04/08/2020   Lab Results  Component Value Date   ALT 22 04/08/2020   AST 24 04/08/2020   ALKPHOS 105 04/08/2020   BILITOT 0.5 04/08/2020   Lab Results  Component Value Date   HGBA1C 5.7 (H) 04/08/2020   HGBA1C 5.6 12/16/2019   Lab Results  Component Value Date   INSULIN 24.0 04/08/2020   INSULIN 11.8 12/16/2019   Lab Results  Component Value Date   TSH 2.100 12/16/2019   Lab Results  Component Value Date   CHOL 139 04/08/2020   HDL 44 04/08/2020   LDLCALC 80 04/08/2020   TRIG 76 04/08/2020   CHOLHDL 5 11/25/2019   Lab Results  Component Value Date   WBC 6.5 12/16/2019   HGB 11.4 12/16/2019   HCT 35.2 12/16/2019   MCV 93 12/16/2019   PLT 277 12/16/2019   Lab Results  Component Value Date   IRON 46 11/25/2019   TIBC 252 09/26/2013   FERRITIN 242.6 11/25/2019    Attestation Statements:   Reviewed by clinician on day of visit: allergies, medications, problem list, medical history, surgical history, family history, social history, and previous encounter notes.   I, Para March, am acting as transcriptionist for Coralie Common, MD.  I have reviewed the above documentation for accuracy and completeness, and I agree with the above. - Jinny Blossom, MD

## 2020-09-07 ENCOUNTER — Telehealth (INDEPENDENT_AMBULATORY_CARE_PROVIDER_SITE_OTHER): Payer: Medicare Other | Admitting: Family

## 2020-09-07 ENCOUNTER — Other Ambulatory Visit: Payer: Self-pay

## 2020-09-07 DIAGNOSIS — J069 Acute upper respiratory infection, unspecified: Secondary | ICD-10-CM | POA: Diagnosis not present

## 2020-09-07 DIAGNOSIS — Z20822 Contact with and (suspected) exposure to covid-19: Secondary | ICD-10-CM | POA: Diagnosis not present

## 2020-09-07 DIAGNOSIS — Z03818 Encounter for observation for suspected exposure to other biological agents ruled out: Secondary | ICD-10-CM | POA: Diagnosis not present

## 2020-09-07 MED ORDER — AZITHROMYCIN 250 MG PO TABS: ORAL_TABLET | ORAL | 0 refills | Status: DC

## 2020-09-07 MED ORDER — ALBUTEROL SULFATE HFA 108 (90 BASE) MCG/ACT IN AERS: 2.0000 | INHALATION_SPRAY | RESPIRATORY_TRACT | 2 refills | Status: DC | PRN

## 2020-09-07 MED ORDER — PROMETHAZINE-DM 6.25-15 MG/5ML PO SYRP
5.0000 mL | ORAL_SOLUTION | Freq: Four times a day (QID) | ORAL | 0 refills | Status: DC | PRN
Start: 2020-09-07 — End: 2021-04-21
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
## 2020-09-07 NOTE — Patient Instructions (Signed)
1.  text "covid" to [id number]  ° ° or  ° °2.  Schedule at [email or web address]  ° °or  °  °3.  Call [phone] ° ° ° ° °

## 2020-09-09 DIAGNOSIS — I129 Hypertensive chronic kidney disease with stage 1 through stage 4 chronic kidney disease, or unspecified chronic kidney disease: Secondary | ICD-10-CM | POA: Diagnosis not present

## 2020-09-09 DIAGNOSIS — N182 Chronic kidney disease, stage 2 (mild): Secondary | ICD-10-CM | POA: Diagnosis not present

## 2020-09-09 DIAGNOSIS — M069 Rheumatoid arthritis, unspecified: Secondary | ICD-10-CM | POA: Diagnosis not present

## 2020-09-09 DIAGNOSIS — R3129 Other microscopic hematuria: Secondary | ICD-10-CM | POA: Diagnosis not present

## 2020-09-09 DIAGNOSIS — Z20822 Contact with and (suspected) exposure to covid-19: Secondary | ICD-10-CM | POA: Diagnosis not present

## 2020-09-13 ENCOUNTER — Telehealth (INDEPENDENT_AMBULATORY_CARE_PROVIDER_SITE_OTHER): Payer: Medicare Other | Admitting: Family Medicine

## 2020-09-13 ENCOUNTER — Other Ambulatory Visit: Payer: Self-pay

## 2020-09-13 ENCOUNTER — Encounter (INDEPENDENT_AMBULATORY_CARE_PROVIDER_SITE_OTHER): Payer: Self-pay | Admitting: Family Medicine

## 2020-09-13 ENCOUNTER — Other Ambulatory Visit (INDEPENDENT_AMBULATORY_CARE_PROVIDER_SITE_OTHER): Payer: Self-pay | Admitting: Family Medicine

## 2020-09-13 DIAGNOSIS — Z6841 Body Mass Index (BMI) 40.0 and over, adult: Secondary | ICD-10-CM

## 2020-09-13 DIAGNOSIS — R7303 Prediabetes: Secondary | ICD-10-CM | POA: Diagnosis not present

## 2020-09-13 DIAGNOSIS — E559 Vitamin D deficiency, unspecified: Secondary | ICD-10-CM | POA: Diagnosis not present

## 2020-09-13 MED ORDER — VITAMIN D (ERGOCALCIFEROL) 1.25 MG (50000 UNIT) PO CAPS: 50000.0000 [IU] | ORAL_CAPSULE | ORAL | 0 refills | Status: DC

## 2020-09-14 ENCOUNTER — Other Ambulatory Visit (INDEPENDENT_AMBULATORY_CARE_PROVIDER_SITE_OTHER): Payer: Self-pay | Admitting: Family Medicine

## 2020-09-14 DIAGNOSIS — E559 Vitamin D deficiency, unspecified: Secondary | ICD-10-CM

## 2020-09-14 NOTE — Progress Notes (Signed)
TeleHealth Visit:  Due to the COVID-19 pandemic, this visit was completed with telemedicine (audio/video) technology to reduce patient and provider exposure as well as to preserve personal protective equipment.   Joy [name] has verbally consented to this TeleHealth visit. The patient is located at home, the provider is located at the [facility] office. The participants in this visit include the listed provider and patient. The visit was conducted today via video.   Chief Complaint: OBESITY Joy [name] is here to discuss her progress with her obesity treatment plan along with follow-up of her obesity related diagnoses. Joy [name] is on the Category 1 Plan and states she is following her eating plan approximately 100% of the time. [name] states she is walking 60 minutes 2 times per week.  Today's visit was #: 16 Starting weight: 268 lbs Starting date: 12/16/2019  Interim History: Virtual visit because 3 people in her house are positive for COVID. She reports weight of 252.8 this morning. 2 weeks ago pt got out and did some walking at the [facility]. She is getting all foods in on plan with no hunger or cravings. No obstacles in the next few weeks.  Subjective:   1. Vitamin D deficiency Pt denies nausea, vomiting, and muscle weakness but notes fatigue. Her last Vit D was 26.2. She is on prescription Vit D.  2. Pre-diabetes Pt's last A1c was 5.7 and Insulin level 24.0. She is on Victoza 1.2 mg SubQ daily.  Assessment/Plan:   1. Vitamin D deficiency Low Vitamin D level contributes to fatigue and are associated with obesity, breast, and colon cancer. She agrees to continue to take prescription Vitamin D @50 ,000 IU every week and will follow-up for routine testing of Vitamin D, at least 2-3 times per year to avoid over-replacement.  - Vitamin D, Ergocalciferol, (DRISDOL) 1.25 MG (50000 UNIT) CAPS capsule; Take 1 capsule (50,000 Units total) by mouth every 7 (seven) days.  Dispense:  4 capsule; Refill: 0  2. Pre-diabetes Nikcole will continue to work on weight loss, exercise, and decreasing simple carbohydrates to help decrease the risk of diabetes. Continue Victoza. Check labs at next appt.  3. Class 3 severe obesity with serious comorbidity and body mass index (BMI) of 40.0 to 44.9 in adult, unspecified obesity type (HCC) [name] is currently in the action stage of change. As such, her goal is to continue with weight loss efforts. She has agreed to the Category 1 Plan.   Exercise goals: All adults should avoid inactivity. Some physical activity is better than none, and adults who participate in any amount of physical activity gain some health benefits.  Behavioral modification strategies: increasing lean protein intake, meal planning and cooking strategies, keeping healthy foods in the home and planning for success.  [name] has agreed to follow-up with our clinic in 2 weeks. She was informed of the importance of frequent follow-up visits to maximize her success with intensive lifestyle modifications for her multiple health conditions.  Objective:   VITALS: Per patient if applicable, see vitals. GENERAL: Alert and in no acute distress. CARDIOPULMONARY: No increased WOB. Speaking in clear sentences.  PSYCH: Pleasant and cooperative. Speech normal rate and rhythm. Affect is appropriate. Insight and judgement are appropriate. Attention is focused, linear, and appropriate.  NEURO: Oriented as arrived to appointment on time with no prompting.   Lab Results  Component Value Date   CREATININE 1.04 (H) 04/08/2020   BUN 15 04/08/2020   NA 143 04/08/2020   K 4.6 04/08/2020  CL 106 04/08/2020   CO2 26 04/08/2020   Lab Results  Component Value Date   ALT 22 04/08/2020   AST 24 04/08/2020   ALKPHOS 105 04/08/2020   BILITOT 0.5 04/08/2020   Lab Results  Component Value Date   HGBA1C 5.7 (H) 04/08/2020   HGBA1C 5.6 12/16/2019   Lab Results  Component Value  Date   INSULIN 24.0 04/08/2020   INSULIN 11.8 12/16/2019   Lab Results  Component Value Date   TSH 2.100 12/16/2019   Lab Results  Component Value Date   CHOL 139 04/08/2020   HDL 44 04/08/2020   LDLCALC 80 04/08/2020   TRIG 76 04/08/2020   CHOLHDL 5 11/25/2019   Lab Results  Component Value Date   WBC 6.5 12/16/2019   HGB 11.4 12/16/2019   HCT 35.2 12/16/2019   MCV 93 12/16/2019   PLT 277 12/16/2019   Lab Results  Component Value Date   IRON 46 11/25/2019   TIBC 252 09/26/2013   FERRITIN 242.6 11/25/2019    Attestation Statements:   Reviewed by clinician on day of visit: allergies, medications, problem list, medical history, surgical history, family history, social history, and previous encounter notes.  Coral Ceo, am acting as transcriptionist for Coralie Common, MD.   I have reviewed the above documentation for accuracy and completeness, and I agree with the above. - Jinny Blossom, MD

## 2020-09-17 ENCOUNTER — Other Ambulatory Visit (INDEPENDENT_AMBULATORY_CARE_PROVIDER_SITE_OTHER): Payer: Self-pay | Admitting: Family Medicine

## 2020-09-17 DIAGNOSIS — R7303 Prediabetes: Secondary | ICD-10-CM

## 2020-09-20 ENCOUNTER — Encounter (INDEPENDENT_AMBULATORY_CARE_PROVIDER_SITE_OTHER): Payer: Self-pay

## 2020-09-20 NOTE — Telephone Encounter (Signed)
Last seen by Dr. Ukleja. 

## 2020-09-28 ENCOUNTER — Encounter (INDEPENDENT_AMBULATORY_CARE_PROVIDER_SITE_OTHER): Payer: Self-pay | Admitting: Family Medicine

## 2020-09-28 ENCOUNTER — Ambulatory Visit (INDEPENDENT_AMBULATORY_CARE_PROVIDER_SITE_OTHER): Payer: Medicare Other | Admitting: Family Medicine

## 2020-09-28 ENCOUNTER — Other Ambulatory Visit: Payer: Self-pay

## 2020-09-28 VITALS — BP 103/72 | HR 81 | Temp 98.0°F | Ht 64.0 in | Wt 254.0 lb

## 2020-09-28 DIAGNOSIS — E559 Vitamin D deficiency, unspecified: Secondary | ICD-10-CM | POA: Diagnosis not present

## 2020-09-28 DIAGNOSIS — Z6841 Body Mass Index (BMI) 40.0 and over, adult: Secondary | ICD-10-CM | POA: Diagnosis not present

## 2020-09-28 DIAGNOSIS — R7303 Prediabetes: Secondary | ICD-10-CM | POA: Diagnosis not present

## 2020-09-28 DIAGNOSIS — I10 Essential (primary) hypertension: Secondary | ICD-10-CM | POA: Diagnosis not present

## 2020-09-28 MED ORDER — VICTOZA 18 MG/3ML ~~LOC~~ SOPN
1.2000 mg | PEN_INJECTOR | Freq: Every day | SUBCUTANEOUS | 0 refills | Status: DC
Start: 2020-09-28 — End: 2020-11-16

## 2020-09-29 LAB — INSULIN, RANDOM: INSULIN: 10.9 u[IU]/mL (ref 2.6–24.9)

## 2020-09-29 LAB — HEMOGLOBIN A1C
Est. average glucose Bld gHb Est-mCnc: 105 mg/dL
Hgb A1c MFr Bld: 5.3 % (ref 4.8–5.6)

## 2020-09-29 LAB — COMPREHENSIVE METABOLIC PANEL
ALT: 16 IU/L (ref 0–32)
AST: 17 IU/L (ref 0–40)
Albumin/Globulin Ratio: 1.9 (ref 1.2–2.2)
Albumin: 4.2 g/dL (ref 3.8–4.9)
Alkaline Phosphatase: 108 IU/L (ref 44–121)
BUN/Creatinine Ratio: 9 (ref 9–23)
BUN: 10 mg/dL (ref 6–24)
Bilirubin Total: 0.5 mg/dL (ref 0.0–1.2)
CO2: 23 mmol/L (ref 20–29)
Calcium: 9 mg/dL (ref 8.7–10.2)
Chloride: 107 mmol/L — ABNORMAL HIGH (ref 96–106)
Creatinine, Ser: 1.08 mg/dL — ABNORMAL HIGH (ref 0.57–1.00)
GFR calc Af Amer: 67 mL/min/{1.73_m2} (ref >59)
GFR calc non Af Amer: 58 mL/min/{1.73_m2} — ABNORMAL LOW (ref >59)
Globulin, Total: 2.2 g/dL (ref 1.5–4.5)
Glucose: 83 mg/dL (ref 65–99)
Potassium: 4.2 mmol/L (ref 3.5–5.2)
Sodium: 146 mmol/L — ABNORMAL HIGH (ref 134–144)
Total Protein: 6.4 g/dL (ref 6.0–8.5)

## 2020-09-29 LAB — LIPID PANEL WITH LDL/HDL RATIO
Cholesterol, Total: 132 mg/dL (ref 100–199)
HDL: 42 mg/dL (ref >39)
LDL Chol Calc (NIH): 77 mg/dL (ref 0–99)
LDL/HDL Ratio: 1.8 ratio (ref 0.0–3.2)
Triglycerides: 60 mg/dL (ref 0–149)
VLDL Cholesterol Cal: 13 mg/dL (ref 5–40)

## 2020-09-29 LAB — VITAMIN D 25 HYDROXY (VIT D DEFICIENCY, FRACTURES): Vit D, 25-Hydroxy: 66.6 ng/mL (ref 30.0–100.0)

## 2020-09-29 NOTE — Progress Notes (Signed)
Chief Complaint:   OBESITY Joy [name] is here to discuss her progress with her obesity treatment plan along with follow-up of her obesity related diagnoses. Joy [name] is on the Category 1 Plan and states she is following her eating plan approximately 100% of the time. Joy [name] states she is walking 60 minutes 3 times per week.  Today's visit was #: 86 Starting weight: 268 lbs Starting date: 12/16/2019 Today's weight: 254 lbs Today's date: 09/28/2020 Total lbs lost to date: 14 lbs Total lbs lost since last in-office visit: 0  Interim History: This is pt's first visit back in the office since the beginning of December. She is getting all foods in on Category 1 and experiencing hunger. She does an orange or granny smith apple for snack calories. She is getting 6 oz of protein at dinner.  Subjective:   1. Pre-diabetes Pt's last A1c was 5.7 with an insulin level of 24.0. She is on GLP-1, Victoza, with no side effects.  2. Essential hypertension BP is well controlled. Pt denies dizziness, lightheadedness, chest pain, chest pressure, or headache. She is on Lisinopril and a statin.  BP Readings from Last 3 Encounters:  09/28/20 103/72  08/13/20 130/80  08/10/20 134/79    3. Vitamin D deficiency Pt denies nausea, vomiting, and muscle weakness but notes fatigue. Pt is on prescription Vit D. Her last Vit D level was 26.2.  Assessment/Plan:   1. Pre-diabetes Joy [name] will continue to work on weight loss, exercise, and decreasing simple carbohydrates to help decrease the risk of diabetes. Check labs today.  - liraglutide (VICTOZA) 18 MG/3ML SOPN; Inject 1.2 mg into the skin daily.  Dispense: 12 mL; Refill: 0  - Hemoglobin A1c - Insulin, random  2. Essential hypertension Joy [name] is working on healthy weight loss and exercise to improve blood pressure control. We will watch for signs of hypotension as she continues her lifestyle modifications. Check labs today.  - Comprehensive  metabolic panel - Lipid Panel With LDL/HDL Ratio  3. Vitamin D deficiency Low Vitamin D level contributes to fatigue and are associated with obesity, breast, and colon cancer. She agrees to continue to take prescription Vitamin D @50 ,000 IU every week and will follow-up for routine testing of Vitamin D, at least 2-3 times per year to avoid over-replacement. Check labs today.  - VITAMIN D 25 Hydroxy (Vit-D Deficiency, Fractures)  4. Class 3 severe obesity with serious comorbidity and body mass index (BMI) of 40.0 to 44.9 in adult, unspecified obesity type (HCC) Joy [name] is currently in the action stage of change. As such, her goal is to continue with weight loss efforts. She has agreed to the Category 2 Plan.   If no change in weight at next appointment, we will consider increasing GLP-1.  Exercise goals: All adults should avoid inactivity. Some physical activity is better than none, and adults who participate in any amount of physical activity gain some health benefits. Add in 10-15 minutes of resistance training 2-3 times a week.  Behavioral modification strategies: increasing lean protein intake, meal planning and cooking strategies, keeping healthy foods in the home and planning for success.  Joy [name] has agreed to follow-up with our clinic in 3 weeks. She was informed of the importance of frequent follow-up visits to maximize her success with intensive lifestyle modifications for her multiple health conditions.   Joy [name] was informed we would discuss her lab results at her next visit unless there is a critical issue that needs to be addressed sooner. Joy [name] agreed  to keep her next visit at the agreed upon time to discuss these results.  Objective:   Blood pressure 103/72, pulse 81, temperature 98 F (36.7 C), temperature source Oral, height 5\' 4"  (1.626 m), weight 254 lb (115.2 kg), SpO2 99 %. Body mass index is 43.6 kg/m.  General: Cooperative, alert, well developed, in no  acute distress. HEENT: Conjunctivae and lids unremarkable. Cardiovascular: Regular rhythm.  Lungs: Normal work of breathing. Neurologic: No focal deficits.   Lab Results  Component Value Date   CREATININE 1.08 (H) 09/28/2020   BUN 10 09/28/2020   NA 146 (H) 09/28/2020   K 4.2 09/28/2020   CL 107 (H) 09/28/2020   CO2 23 09/28/2020   Lab Results  Component Value Date   ALT 16 09/28/2020   AST 17 09/28/2020   ALKPHOS 108 09/28/2020   BILITOT 0.5 09/28/2020   Lab Results  Component Value Date   HGBA1C 5.3 09/28/2020   HGBA1C 5.7 (H) 04/08/2020   HGBA1C 5.6 12/16/2019   Lab Results  Component Value Date   INSULIN 10.9 09/28/2020   INSULIN 24.0 04/08/2020   INSULIN 11.8 12/16/2019   Lab Results  Component Value Date   TSH 2.100 12/16/2019   Lab Results  Component Value Date   CHOL 132 09/28/2020   HDL 42 09/28/2020   LDLCALC 77 09/28/2020   TRIG 60 09/28/2020   CHOLHDL 5 11/25/2019   Lab Results  Component Value Date   WBC 6.5 12/16/2019   HGB 11.4 12/16/2019   HCT 35.2 12/16/2019   MCV 93 12/16/2019   PLT 277 12/16/2019   Lab Results  Component Value Date   IRON 46 11/25/2019   TIBC 252 09/26/2013   FERRITIN 242.6 11/25/2019    Obesity Behavioral Intervention:   Approximately 15 minutes were spent on the discussion below.  ASK: We discussed the diagnosis of obesity with Joy [name] today and Joy [name] agreed to give [name] permission to discuss obesity behavioral modification therapy today.  ASSESS: Joy [name] has the diagnosis of obesity and her BMI today is 43.7. Joy [name] is in the action stage of change.   ADVISE: Joy [name] was educated on the multiple health risks of obesity as well as the benefit of weight loss to improve her health. She was advised of the need for long term treatment and the importance of lifestyle modifications to improve her current health and to decrease her risk of future health problems.  AGREE: Multiple dietary modification  options and treatment options were discussed and Joy [name] agreed to follow the recommendations documented in the above note.  ARRANGE: Joy [name] was educated on the importance of frequent visits to treat obesity as outlined per CMS and USPSTF guidelines and agreed to schedule her next follow up appointment today.  Attestation Statements:   Reviewed by clinician on day of visit: allergies, medications, problem list, medical history, surgical history, family history, social history, and previous encounter notes.  Coral Ceo, am acting as transcriptionist for Coralie Common, MD.   I have reviewed the above documentation for accuracy and completeness, and I agree with the above. - Jinny Blossom, MD

## 2020-10-09 ENCOUNTER — Other Ambulatory Visit (INDEPENDENT_AMBULATORY_CARE_PROVIDER_SITE_OTHER): Payer: Self-pay | Admitting: Family Medicine

## 2020-10-09 DIAGNOSIS — E559 Vitamin D deficiency, unspecified: Secondary | ICD-10-CM

## 2020-10-11 NOTE — Telephone Encounter (Signed)
Last seen by Dr. Ukleja. 

## 2020-10-19 ENCOUNTER — Encounter (INDEPENDENT_AMBULATORY_CARE_PROVIDER_SITE_OTHER): Payer: Self-pay | Admitting: Family Medicine

## 2020-10-19 ENCOUNTER — Telehealth (INDEPENDENT_AMBULATORY_CARE_PROVIDER_SITE_OTHER): Payer: Medicare Other | Admitting: Family Medicine

## 2020-10-19 ENCOUNTER — Other Ambulatory Visit (INDEPENDENT_AMBULATORY_CARE_PROVIDER_SITE_OTHER): Payer: Self-pay | Admitting: Family Medicine

## 2020-10-19 ENCOUNTER — Other Ambulatory Visit: Payer: Self-pay

## 2020-10-19 DIAGNOSIS — Z6841 Body Mass Index (BMI) 40.0 and over, adult: Secondary | ICD-10-CM | POA: Diagnosis not present

## 2020-10-19 DIAGNOSIS — E559 Vitamin D deficiency, unspecified: Secondary | ICD-10-CM

## 2020-10-19 DIAGNOSIS — R7303 Prediabetes: Secondary | ICD-10-CM

## 2020-10-19 MED ORDER — VITAMIN D (ERGOCALCIFEROL) 1.25 MG (50000 UNIT) PO CAPS: 50000.0000 [IU] | ORAL_CAPSULE | ORAL | 0 refills | Status: DC

## 2020-10-19 NOTE — Telephone Encounter (Signed)
Last seen Dawn 

## 2020-10-20 ENCOUNTER — Encounter (INDEPENDENT_AMBULATORY_CARE_PROVIDER_SITE_OTHER): Payer: Self-pay | Admitting: Family Medicine

## 2020-10-20 NOTE — Progress Notes (Signed)
TeleHealth Visit:  Due to the COVID-19 pandemic, this visit was completed with telemedicine (audio/video) technology to reduce patient and provider exposure as well as to preserve personal protective equipment.   Joy [name] has verbally consented to this TeleHealth visit. The patient is located at home, the provider is located at the [facility] office. The participants in this visit include the listed provider and patient. The visit was conducted today via video.   Chief Complaint: OBESITY Joy [name] is here to discuss her progress with her obesity treatment plan along with follow-up of her obesity related diagnoses. Joy [name] is on the Category 2 Plan and states she is following her eating plan approximately 100% of the time. Joy [name] states she is walking 60 minutes 3 times per week.  Today's visit was #: 18 Starting weight: 268 lbs Starting date: 12/16/2019  Interim History: We are doing a video visit today because Joy [name] is experiencing knee pain. She reports a weight of 250 lbs today and was 254 lbs at last OV. She was changed to category 2 plan last OV due to polyphagia but she has been following category 1 because she forgot to follow category 2. She feels hunger is satisfied.  She does have SSB (juice) at times.  Subjective:   1. Vitamin D deficiency Sandy's Vit D was at goal at last lab draw on 09/28/2020 (66). She is on weekly prescription Vit D.  2. Pre-diabetes Sandy's last A1c was 5.3. She is on Victoza 1.2 mg daily for appetite control and pre-diabetes. Her appetite is well controlled. She denies constipation or nausea.   Lab Results  Component Value Date   HGBA1C 5.3 09/28/2020   Lab Results  Component Value Date   INSULIN 10.9 09/28/2020   INSULIN 24.0 04/08/2020   INSULIN 11.8 12/16/2019    Assessment/Plan:   1. Vitamin D deficiency  She agrees to continue to take prescription Vitamin D @50 ,000 IU every week for 1 month and then switch to OTC  Vit D3 5,000 IU daily. She will follow-up for routine testing of Vitamin D, at least 2-3 times per year to avoid over-replacement.  - Vitamin D, Ergocalciferol, (DRISDOL) 1.25 MG (50000 UNIT) CAPS capsule; Take 1 capsule (50,000 Units total) by mouth every 7 (seven) days.  Dispense: 4 capsule; Refill: 0  2. Pre-diabetes . Continue Victoza 1.2 mg daily.  3. Class 3 severe obesity with serious comorbidity and body mass index (BMI) of 40.0 to 44.9 in adult, unspecified obesity type (HCC) Joy [name] is currently in the action stage of change. As such, her goal is to continue with weight loss efforts. She has agreed to the Category 2 Plan.   Joy [name] will include juice calories in extra calories.  Exercise goals: As is  Behavioral modification strategies: decreasing liquid calories and planning for success.  Joy [name] has agreed to follow-up with our clinic in 3 weeks.  Objective:   VITALS: Per patient if applicable, see vitals. GENERAL: Alert and in no acute distress. CARDIOPULMONARY: No increased WOB. Speaking in clear sentences.  PSYCH: Pleasant and cooperative. Speech normal rate and rhythm. Affect is appropriate. Insight and judgement are appropriate. Attention is focused, linear, and appropriate.  NEURO: Oriented as arrived to appointment on time with no prompting.   Lab Results  Component Value Date   CREATININE 1.08 (H) 09/28/2020   BUN 10 09/28/2020   NA 146 (H) 09/28/2020   K 4.2 09/28/2020   CL 107 (H) 09/28/2020   CO2 23 09/28/2020  Lab Results  Component Value Date   ALT 16 09/28/2020   AST 17 09/28/2020   ALKPHOS 108 09/28/2020   BILITOT 0.5 09/28/2020   Lab Results  Component Value Date   HGBA1C 5.3 09/28/2020   HGBA1C 5.7 (H) 04/08/2020   HGBA1C 5.6 12/16/2019   Lab Results  Component Value Date   INSULIN 10.9 09/28/2020   INSULIN 24.0 04/08/2020   INSULIN 11.8 12/16/2019   Lab Results  Component Value Date   TSH 2.100 12/16/2019   Lab Results   Component Value Date   CHOL 132 09/28/2020   HDL 42 09/28/2020   LDLCALC 77 09/28/2020   TRIG 60 09/28/2020   CHOLHDL 5 11/25/2019   Lab Results  Component Value Date   WBC 6.5 12/16/2019   HGB 11.4 12/16/2019   HCT 35.2 12/16/2019   MCV 93 12/16/2019   PLT 277 12/16/2019   Lab Results  Component Value Date   IRON 46 11/25/2019   TIBC 252 09/26/2013   FERRITIN 242.6 11/25/2019    Attestation Statements:   Reviewed by clinician on day of visit: allergies, medications, problem list, medical history, surgical history, family history, social history, and previous encounter notes.  Coral Ceo, am acting as Location manager for [employer], [place].  I have reviewed the above documentation for accuracy and completeness, and I agree with the above. - Georgianne Fick, FNP

## 2020-11-02 ENCOUNTER — Emergency Department (HOSPITAL_BASED_OUTPATIENT_CLINIC_OR_DEPARTMENT_OTHER): Payer: Medicare Other

## 2020-11-02 ENCOUNTER — Emergency Department (HOSPITAL_BASED_OUTPATIENT_CLINIC_OR_DEPARTMENT_OTHER)
Admission: EM | Admit: 2020-11-02 | Discharge: 2020-11-02 | Disposition: A | Discharge status: Home / Self Care | Payer: Medicare Other | Attending: Emergency Medicine | Admitting: Emergency Medicine

## 2020-11-02 ENCOUNTER — Other Ambulatory Visit: Payer: Self-pay

## 2020-11-02 ENCOUNTER — Encounter (HOSPITAL_BASED_OUTPATIENT_CLINIC_OR_DEPARTMENT_OTHER): Payer: Self-pay | Admitting: *Deleted

## 2020-11-02 DIAGNOSIS — M25531 Pain in right wrist: Secondary | ICD-10-CM | POA: Diagnosis not present

## 2020-11-02 DIAGNOSIS — I129 Hypertensive chronic kidney disease with stage 1 through stage 4 chronic kidney disease, or unspecified chronic kidney disease: Secondary | ICD-10-CM | POA: Insufficient documentation

## 2020-11-02 DIAGNOSIS — S52501A Unspecified fracture of the lower end of right radius, initial encounter for closed fracture: Secondary | ICD-10-CM | POA: Diagnosis not present

## 2020-11-02 DIAGNOSIS — Z79899 Other long term (current) drug therapy: Secondary | ICD-10-CM | POA: Diagnosis not present

## 2020-11-02 DIAGNOSIS — Y92410 Unspecified street and highway as the place of occurrence of the external cause: Secondary | ICD-10-CM | POA: Insufficient documentation

## 2020-11-02 DIAGNOSIS — Z794 Long term (current) use of insulin: Secondary | ICD-10-CM | POA: Insufficient documentation

## 2020-11-02 DIAGNOSIS — Z7952 Long term (current) use of systemic steroids: Secondary | ICD-10-CM | POA: Diagnosis not present

## 2020-11-02 DIAGNOSIS — R0789 Other chest pain: Secondary | ICD-10-CM | POA: Diagnosis not present

## 2020-11-02 DIAGNOSIS — J4540 Moderate persistent asthma, uncomplicated: Secondary | ICD-10-CM | POA: Diagnosis not present

## 2020-11-02 DIAGNOSIS — N182 Chronic kidney disease, stage 2 (mild): Secondary | ICD-10-CM | POA: Insufficient documentation

## 2020-11-02 DIAGNOSIS — R7303 Prediabetes: Secondary | ICD-10-CM | POA: Insufficient documentation

## 2020-11-02 DIAGNOSIS — M7989 Other specified soft tissue disorders: Secondary | ICD-10-CM | POA: Diagnosis not present

## 2020-11-02 DIAGNOSIS — S6991XA Unspecified injury of right wrist, hand and finger(s), initial encounter: Secondary | ICD-10-CM | POA: Diagnosis present

## 2020-11-02 DIAGNOSIS — J45909 Unspecified asthma, uncomplicated: Secondary | ICD-10-CM | POA: Insufficient documentation

## 2020-11-02 MED ORDER — OXYCODONE-ACETAMINOPHEN 5-325 MG PO TABS
1.0000 | ORAL_TABLET | Freq: Once | ORAL | Status: AC
  Administered 2020-11-02: 1 via ORAL
  Filled 2020-11-02: qty 1

## 2020-11-02 MED ORDER — HYDROCODONE-ACETAMINOPHEN 5-325 MG PO TABS: 1.0000 | ORAL_TABLET | Freq: Four times a day (QID) | ORAL | 0 refills | Status: DC | PRN

## 2020-11-02 NOTE — Discharge Instructions (Signed)
At this time there does not appear to be the presence of an emergent medical condition, however there is always the potential for conditions to change. Please read and follow the below instructions.  Please return to the Emergency Department immediately for any new or worsening symptoms. Please be sure to follow up with your Primary Care Provider within one week regarding your visit today; please call their office to schedule an appointment even if you are feeling better for a follow-up visit. You may take the medication Norco (Hydrocodone/Acetaminophen) as prescribed to help with severe pain.  This medication will make you drowsy so do not drive, drink alcohol, take other sedating medications or perform any dangerous activities such as driving after taking Norco. Norco contains Tylenol (acetaminophen) so do not take any other Tylenol-containing products with Norco. Please use rest ice and elevation to help with pain and swelling. Please call the hand specialist on your discharge paperwork, Dr. Amedeo Plenty today to schedule follow-up appointment for further evaluation definitive treatment of your wrist fracture.  Go to the nearest Emergency Department immediately if: You have fever or chills You cannot move your fingers. You have severe pain. Your fingers or your hand: Become numb, cold, or pale. Turn a bluish color. You have: Loss of feeling (numbness), tingling, or weakness in your arms or legs. Very bad neck pain, especially tenderness in the middle of the back of your neck. A change in your ability to control your pee or poop (stool). More pain in any area of your body. Swelling in any area of your body, especially your legs. Shortness of breath or light-headedness. Chest pain. Blood in your pee, poop, or vomit. Very bad pain in your belly (abdomen) or your back. Very bad headaches or headaches that are getting worse. Sudden vision loss or double vision. Your eye suddenly turns red. The  black center of your eye (pupil) is an odd shape or size. You have any new/concerning or worsening of symptoms  Please read the additional information packets attached to your discharge summary.  Do not take your medicine if  develop an itchy rash, swelling in your mouth or lips, or difficulty breathing; call 911 and seek immediate emergency medical attention if this occurs.  You may review your lab tests and imaging results in their entirety on your MyChart account.  Please discuss all results of fully with your primary care provider and other specialist at your follow-up visit.  Note: Portions of this text may have been transcribed using voice recognition software. Every effort was made to ensure accuracy; however, inadvertent computerized transcription errors may still be present.
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
## 2020-11-08 DIAGNOSIS — S52501A Unspecified fracture of the lower end of right radius, initial encounter for closed fracture: Secondary | ICD-10-CM | POA: Diagnosis not present

## 2020-11-09 ENCOUNTER — Other Ambulatory Visit: Payer: Self-pay

## 2020-11-09 ENCOUNTER — Ambulatory Visit (INDEPENDENT_AMBULATORY_CARE_PROVIDER_SITE_OTHER): Payer: Medicare Other | Admitting: Family Medicine

## 2020-11-09 ENCOUNTER — Encounter (INDEPENDENT_AMBULATORY_CARE_PROVIDER_SITE_OTHER): Payer: Self-pay | Admitting: Family Medicine

## 2020-11-09 VITALS — BP 113/73 | HR 74 | Temp 97.7°F | Ht 64.0 in | Wt 258.0 lb

## 2020-11-09 DIAGNOSIS — R7303 Prediabetes: Secondary | ICD-10-CM

## 2020-11-09 DIAGNOSIS — Z6841 Body Mass Index (BMI) 40.0 and over, adult: Secondary | ICD-10-CM

## 2020-11-09 DIAGNOSIS — K5903 Drug induced constipation: Secondary | ICD-10-CM | POA: Diagnosis not present

## 2020-11-11 DIAGNOSIS — G8918 Other acute postprocedural pain: Secondary | ICD-10-CM | POA: Diagnosis not present

## 2020-11-11 DIAGNOSIS — S52591A Other fractures of lower end of right radius, initial encounter for closed fracture: Secondary | ICD-10-CM | POA: Diagnosis not present

## 2020-11-11 DIAGNOSIS — S52571A Other intraarticular fracture of lower end of right radius, initial encounter for closed fracture: Secondary | ICD-10-CM | POA: Diagnosis not present

## 2020-11-11 HISTORY — PX: WRIST SURGERY: SHX841

## 2020-11-13 ENCOUNTER — Other Ambulatory Visit (INDEPENDENT_AMBULATORY_CARE_PROVIDER_SITE_OTHER): Payer: Self-pay | Admitting: Family Medicine

## 2020-11-13 DIAGNOSIS — E7849 Other hyperlipidemia: Secondary | ICD-10-CM

## 2020-11-15 NOTE — Telephone Encounter (Signed)
Dr.Ukleja 

## 2020-11-16 ENCOUNTER — Other Ambulatory Visit (INDEPENDENT_AMBULATORY_CARE_PROVIDER_SITE_OTHER): Payer: Self-pay | Admitting: Family Medicine

## 2020-11-16 DIAGNOSIS — M159 Polyosteoarthritis, unspecified: Secondary | ICD-10-CM | POA: Diagnosis not present

## 2020-11-16 DIAGNOSIS — E7849 Other hyperlipidemia: Secondary | ICD-10-CM

## 2020-11-16 DIAGNOSIS — Z79899 Other long term (current) drug therapy: Secondary | ICD-10-CM | POA: Diagnosis not present

## 2020-11-16 DIAGNOSIS — E559 Vitamin D deficiency, unspecified: Secondary | ICD-10-CM

## 2020-11-16 DIAGNOSIS — M0609 Rheumatoid arthritis without rheumatoid factor, multiple sites: Secondary | ICD-10-CM | POA: Diagnosis not present

## 2020-11-16 DIAGNOSIS — R7303 Prediabetes: Secondary | ICD-10-CM

## 2020-11-16 MED ORDER — VITAMIN D (ERGOCALCIFEROL) 1.25 MG (50000 UNIT) PO CAPS: 50000.0000 [IU] | ORAL_CAPSULE | ORAL | 0 refills | Status: DC

## 2020-11-16 MED ORDER — VICTOZA 18 MG/3ML ~~LOC~~ SOPN: 1.2000 mg | PEN_INJECTOR | Freq: Every day | SUBCUTANEOUS | 0 refills | Status: DC

## 2020-11-16 MED ORDER — BD PEN NEEDLE NANO 2ND GEN 32G X 4 MM MISC: 1.0000 | Freq: Two times a day (BID) | 0 refills | Status: DC

## 2020-11-16 NOTE — Telephone Encounter (Signed)
Would you like to refill? 
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
## 2020-11-16 NOTE — Telephone Encounter (Signed)
Dr.Ukleja 

## 2020-11-23 ENCOUNTER — Other Ambulatory Visit: Payer: Self-pay | Admitting: Family

## 2020-11-30 ENCOUNTER — Encounter (INDEPENDENT_AMBULATORY_CARE_PROVIDER_SITE_OTHER): Payer: Self-pay | Admitting: Physician Assistant

## 2020-11-30 ENCOUNTER — Other Ambulatory Visit: Payer: Self-pay

## 2020-11-30 ENCOUNTER — Ambulatory Visit (INDEPENDENT_AMBULATORY_CARE_PROVIDER_SITE_OTHER): Payer: Medicare Other | Admitting: Physician Assistant

## 2020-11-30 VITALS — BP 117/87 | HR 89 | Temp 97.9°F | Ht 64.0 in | Wt 257.0 lb

## 2020-11-30 DIAGNOSIS — Z6841 Body Mass Index (BMI) 40.0 and over, adult: Secondary | ICD-10-CM | POA: Diagnosis not present

## 2020-11-30 DIAGNOSIS — I10 Essential (primary) hypertension: Secondary | ICD-10-CM

## 2020-11-30 DIAGNOSIS — R7303 Prediabetes: Secondary | ICD-10-CM | POA: Diagnosis not present

## 2020-11-30 DIAGNOSIS — E66813 Obesity, class 3: Secondary | ICD-10-CM

## 2020-12-01 DIAGNOSIS — S52501D Unspecified fracture of the lower end of right radius, subsequent encounter for closed fracture with routine healing: Secondary | ICD-10-CM | POA: Diagnosis not present
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
## 2020-12-09 DIAGNOSIS — S52501D Unspecified fracture of the lower end of right radius, subsequent encounter for closed fracture with routine healing: Secondary | ICD-10-CM | POA: Diagnosis not present

## 2020-12-09 DIAGNOSIS — M25631 Stiffness of right wrist, not elsewhere classified: Secondary | ICD-10-CM | POA: Diagnosis not present

## 2020-12-12 ENCOUNTER — Other Ambulatory Visit (INDEPENDENT_AMBULATORY_CARE_PROVIDER_SITE_OTHER): Payer: Self-pay | Admitting: Family Medicine

## 2020-12-12 DIAGNOSIS — E559 Vitamin D deficiency, unspecified: Secondary | ICD-10-CM

## 2020-12-13 DIAGNOSIS — M47816 Spondylosis without myelopathy or radiculopathy, lumbar region: Secondary | ICD-10-CM | POA: Diagnosis not present

## 2020-12-13 DIAGNOSIS — M2578 Osteophyte, vertebrae: Secondary | ICD-10-CM | POA: Diagnosis not present

## 2020-12-13 DIAGNOSIS — M4316 Spondylolisthesis, lumbar region: Secondary | ICD-10-CM | POA: Diagnosis not present

## 2020-12-13 DIAGNOSIS — R52 Pain, unspecified: Secondary | ICD-10-CM | POA: Diagnosis not present

## 2020-12-13 DIAGNOSIS — M546 Pain in thoracic spine: Secondary | ICD-10-CM | POA: Diagnosis not present

## 2020-12-13 NOTE — Telephone Encounter (Signed)
Pt last seen by Tracey Aguilar, PA-C.  

## 2020-12-14 ENCOUNTER — Other Ambulatory Visit (INDEPENDENT_AMBULATORY_CARE_PROVIDER_SITE_OTHER): Payer: Self-pay | Admitting: Family Medicine

## 2020-12-14 DIAGNOSIS — R7303 Prediabetes: Secondary | ICD-10-CM

## 2020-12-14 DIAGNOSIS — E7849 Other hyperlipidemia: Secondary | ICD-10-CM

## 2020-12-14 NOTE — Telephone Encounter (Signed)
Joy [name] 

## 2020-12-16 ENCOUNTER — Other Ambulatory Visit: Payer: Self-pay

## 2020-12-16 ENCOUNTER — Encounter (INDEPENDENT_AMBULATORY_CARE_PROVIDER_SITE_OTHER): Payer: Self-pay | Admitting: Physician Assistant

## 2020-12-16 ENCOUNTER — Other Ambulatory Visit (INDEPENDENT_AMBULATORY_CARE_PROVIDER_SITE_OTHER): Payer: Self-pay | Admitting: Physician Assistant

## 2020-12-16 ENCOUNTER — Ambulatory Visit (INDEPENDENT_AMBULATORY_CARE_PROVIDER_SITE_OTHER): Payer: Medicare Other | Admitting: Physician Assistant

## 2020-12-16 VITALS — BP 113/74 | HR 87 | Temp 98.0°F | Ht 64.0 in | Wt 256.0 lb

## 2020-12-16 DIAGNOSIS — Z6841 Body Mass Index (BMI) 40.0 and over, adult: Secondary | ICD-10-CM

## 2020-12-16 DIAGNOSIS — E559 Vitamin D deficiency, unspecified: Secondary | ICD-10-CM

## 2020-12-16 DIAGNOSIS — R7303 Prediabetes: Secondary | ICD-10-CM | POA: Diagnosis not present

## 2020-12-16 DIAGNOSIS — Z9189 Other specified personal risk factors, not elsewhere classified: Secondary | ICD-10-CM

## 2020-12-16 MED ORDER — VITAMIN D (ERGOCALCIFEROL) 1.25 MG (50000 UNIT) PO CAPS: 50000.0000 [IU] | ORAL_CAPSULE | ORAL | 0 refills | Status: DC

## 2020-12-16 MED ORDER — VICTOZA 18 MG/3ML ~~LOC~~ SOPN: 1.2000 mg | PEN_INJECTOR | Freq: Every day | SUBCUTANEOUS | 0 refills | Status: DC

## 2020-12-17 DIAGNOSIS — M25631 Stiffness of right wrist, not elsewhere classified: Secondary | ICD-10-CM | POA: Diagnosis not present

## 2020-12-20 NOTE — Progress Notes (Signed)
Chief Complaint:   OBESITY Joy [name] is here to discuss her progress with her obesity treatment plan along with follow-up of her obesity related diagnoses. Joy [name] is on the Category 1 Plan and states she is following her eating plan approximately 100% of the time. Joy [name] states she is walking for 60 minutes 4 times per week.  Today's visit was #: 21 Starting weight: 268 lbs Starting date: 12/16/2019 Today's weight: 256 lbs Today's date: 12/16/2020 Total lbs lost to date: 12 Total lbs lost since last in-office visit: 1  Interim History: Joy [name] did well with weight loss. She is eating all of the food on the plan and her hunger is controlled. She misses Miracle whip on her sandwiches.  Subjective:   1. Pre-diabetes Joy [name] on Victoza, and she denies nausea, vomiting, or diarrhea. Her appetite is controlled.  2. Vitamin D deficiency Joy [name] denies nausea, vomiting, or muscle weakness. She is on Vit D weekly.  3. At risk for diabetes mellitus Joy [name] is at higher than average risk for developing diabetes due to obesity.   Assessment/Plan:   1. Pre-diabetes Joy [name] will continue to work on weight loss, exercise, and decreasing simple carbohydrates to help decrease the risk of diabetes. We will refill Victoza for 1 month.  - liraglutide (VICTOZA) 18 MG/3ML SOPN; Inject 1.2 mg into the skin daily.  Dispense: 6 mL; Refill: 0  2. Vitamin D deficiency Low Vitamin D level contributes to fatigue and are associated with obesity, breast, and colon cancer. We will refill prescription Vitamin D for 1 month. Joy [name] will follow-up for routine testing of Vitamin D, at least 2-3 times per year to avoid over-replacement.  - Vitamin D, Ergocalciferol, (DRISDOL) 1.25 MG (50000 UNIT) CAPS capsule; Take 1 capsule (50,000 Units total) by mouth every 7 (seven) days.  Dispense: 4 capsule; Refill: 0  3. At risk for diabetes mellitus Joy [name] was given approximately 15 minutes of  diabetes education and counseling today. We discussed intensive lifestyle modifications today with an emphasis on weight loss as well as increasing exercise and decreasing simple carbohydrates in her diet. We also reviewed medication options with an emphasis on risk versus benefit of those discussed.   Repetitive spaced learning was employed today to elicit superior memory formation and behavioral change.  4. Class 3 severe obesity with serious comorbidity and body mass index (BMI) of 45.0 to 49.9 in adult, unspecified obesity type (HCC) Joy [name] is currently in the action stage of change. As such, her goal is to continue with weight loss efforts. She has agreed to the Category 1 Plan.   Exercise goals: As is.  Behavioral modification strategies: meal planning and cooking strategies.  Joy [name] has agreed to follow-up with our clinic in 2 weeks. She was informed of the importance of frequent follow-up visits to maximize her success with intensive lifestyle modifications for her multiple health conditions.   Objective:   Blood pressure 113/74, pulse 87, temperature 98 F (36.7 C), height 5\' 4"  (1.626 m), weight 256 lb (116.1 kg), SpO2 98 %. Body mass index is 43.94 kg/m.  General: Cooperative, alert, well developed, in no acute distress. HEENT: Conjunctivae and lids unremarkable. Cardiovascular: Regular rhythm.  Lungs: Normal work of breathing. Neurologic: No focal deficits.   Lab Results  Component Value Date   CREATININE 1.08 (H) 09/28/2020   BUN 10 09/28/2020   NA 146 (H) 09/28/2020   K 4.2 09/28/2020   CL 107 (H) 09/28/2020   CO2 23 09/28/2020   Lab Results  Component Value Date   ALT 16 09/28/2020   AST 17 09/28/2020   ALKPHOS 108 09/28/2020   BILITOT 0.5 09/28/2020   Lab Results  Component Value Date   HGBA1C 5.3 09/28/2020   HGBA1C 5.7 (H) 04/08/2020   HGBA1C 5.6 12/16/2019   Lab Results  Component Value Date   INSULIN 10.9 09/28/2020   INSULIN 24.0  04/08/2020   INSULIN 11.8 12/16/2019   Lab Results  Component Value Date   TSH 2.100 12/16/2019   Lab Results  Component Value Date   CHOL 132 09/28/2020   HDL 42 09/28/2020   LDLCALC 77 09/28/2020   TRIG 60 09/28/2020   CHOLHDL 5 11/25/2019   Lab Results  Component Value Date   WBC 6.5 12/16/2019   HGB 11.4 12/16/2019   HCT 35.2 12/16/2019   MCV 93 12/16/2019   PLT 277 12/16/2019   Lab Results  Component Value Date   IRON 46 11/25/2019   TIBC 252 09/26/2013   FERRITIN 242.6 11/25/2019   Attestation Statements:   Reviewed by clinician on day of visit: allergies, medications, problem list, medical history, surgical history, family history, social history, and previous encounter notes.   Wilhemena Durie, am acting as transcriptionist for [employer], PA-C.  I have reviewed the above documentation for accuracy and completeness, and I agree with the above. Abby Potash, PA-C

## 2020-12-27 DIAGNOSIS — M25631 Stiffness of right wrist, not elsewhere classified: Secondary | ICD-10-CM | POA: Diagnosis not present

## 2021-01-04 ENCOUNTER — Ambulatory Visit (INDEPENDENT_AMBULATORY_CARE_PROVIDER_SITE_OTHER): Payer: Medicare Other | Admitting: Physician Assistant

## 2021-01-04 ENCOUNTER — Other Ambulatory Visit: Payer: Self-pay

## 2021-01-04 ENCOUNTER — Encounter (INDEPENDENT_AMBULATORY_CARE_PROVIDER_SITE_OTHER): Payer: Self-pay | Admitting: Physician Assistant

## 2021-01-04 VITALS — BP 108/75 | HR 78 | Temp 98.3°F | Ht 64.0 in | Wt 256.0 lb

## 2021-01-04 DIAGNOSIS — E7849 Other hyperlipidemia: Secondary | ICD-10-CM | POA: Diagnosis not present

## 2021-01-04 DIAGNOSIS — M25631 Stiffness of right wrist, not elsewhere classified: Secondary | ICD-10-CM | POA: Diagnosis not present

## 2021-01-04 DIAGNOSIS — Z6841 Body Mass Index (BMI) 40.0 and over, adult: Secondary | ICD-10-CM | POA: Diagnosis not present

## 2021-01-04 DIAGNOSIS — R7303 Prediabetes: Secondary | ICD-10-CM | POA: Diagnosis not present

## 2021-01-04 DIAGNOSIS — Z9189 Other specified personal risk factors, not elsewhere classified: Secondary | ICD-10-CM

## 2021-01-04 NOTE — Progress Notes (Signed)
Chief Complaint:   OBESITY Joy [name] is here to discuss her progress with her obesity treatment plan along with follow-up of her obesity related diagnoses. Joy [name] is on the Category 1 Plan and states she is following her eating plan approximately 100% of the time. Joy [name] states she is walking for 60 minutes 3 times per week.  Today's visit was #: 53 Starting weight: 268 lbs Starting date: 12/16/2019 Today's weight: 256 lbs Today's date: 01/04/2021 Total lbs lost to date: 12 Total lbs lost since last in-office visit: 0  Interim History: Joy [name] reports that she is hungrier in the afternoon after lunch. She is on 1.2 mg of Victoza. She is not eating her snack calories.   Subjective:   1. Pre-diabetes Joy [name] notes hunger after lunch. She is tolerating Victoza 1.2 mg well.  2. Other hyperlipidemia Joy [name] is on Lipitor, and she is tolerating it well.  3. At risk for heart disease Joy [name] is at a higher than average risk for cardiovascular disease due to obesity.   Assessment/Plan:   1. Pre-diabetes Joy [name] agreed to increase Victoza to 1.2 mg plus 5 clicks (1.5 mg). She will continue to work on weight loss, exercise, and decreasing simple carbohydrates to help decrease the risk of diabetes.   2. Other hyperlipidemia Cardiovascular risk and specific lipid/LDL goals reviewed. We discussed several lifestyle modifications today. Joy [name] will continue her medications and weight loss efforts. Orders and follow up as documented in patient record.   Counseling Intensive lifestyle modifications are the first line treatment for this issue. . Dietary changes: Increase soluble fiber. Decrease simple carbohydrates. . Exercise changes: Moderate to vigorous-intensity aerobic activity 150 minutes per week if tolerated. . Lipid-lowering medications: see documented in medical record.  3. At risk for heart disease Joy [name] was given approximately 15 minutes of coronary  artery disease prevention counseling today. She is 55 y.o. female and has risk factors for heart disease including obesity. We discussed intensive lifestyle modifications today with an emphasis on specific weight loss instructions and strategies.   Repetitive spaced learning was employed today to elicit superior memory formation and behavioral change.  4. Class 3 severe obesity with serious comorbidity and body mass index (BMI) of 45.0 to 49.9 in adult, unspecified obesity type (HCC) Joy [name] is currently in the action stage of change. As such, her goal is to continue with weight loss efforts. She has agreed to change to the Category 2 Plan.   We will recheck IC and labs at her next visit.  Exercise goals: As is.  Behavioral modification strategies: meal planning and cooking strategies and planning for success.  Joy [name] has agreed to follow-up with our clinic in 4 weeks. She was informed of the importance of frequent follow-up visits to maximize her success with intensive lifestyle modifications for her multiple health conditions.   Objective:   Blood pressure 108/75, pulse 78, temperature 98.3 F (36.8 C), height 5\' 4"  (1.626 m), weight 256 lb (116.1 kg), SpO2 100 %. Body mass index is 43.94 kg/m.  General: Cooperative, alert, well developed, in no acute distress. HEENT: Conjunctivae and lids unremarkable. Cardiovascular: Regular rhythm.  Lungs: Normal work of breathing. Neurologic: No focal deficits.   Lab Results  Component Value Date   CREATININE 1.08 (H) 09/28/2020   BUN 10 09/28/2020   NA 146 (H) 09/28/2020   K 4.2 09/28/2020   CL 107 (H) 09/28/2020   CO2 23 09/28/2020   Lab Results  Component Value Date   ALT 16 09/28/2020  AST 17 09/28/2020   ALKPHOS 108 09/28/2020   BILITOT 0.5 09/28/2020   Lab Results  Component Value Date   HGBA1C 5.3 09/28/2020   HGBA1C 5.7 (H) 04/08/2020   HGBA1C 5.6 12/16/2019   Lab Results  Component Value Date   INSULIN 10.9  09/28/2020   INSULIN 24.0 04/08/2020   INSULIN 11.8 12/16/2019   Lab Results  Component Value Date   TSH 2.100 12/16/2019   Lab Results  Component Value Date   CHOL 132 09/28/2020   HDL 42 09/28/2020   LDLCALC 77 09/28/2020   TRIG 60 09/28/2020   CHOLHDL 5 11/25/2019   Lab Results  Component Value Date   WBC 6.5 12/16/2019   HGB 11.4 12/16/2019   HCT 35.2 12/16/2019   MCV 93 12/16/2019   PLT 277 12/16/2019   Lab Results  Component Value Date   IRON 46 11/25/2019   TIBC 252 09/26/2013   FERRITIN 242.6 11/25/2019   Attestation Statements:   Reviewed by clinician on day of visit: allergies, medications, problem list, medical history, surgical history, family history, social history, and previous encounter notes.   Wilhemena Durie, am acting as transcriptionist for [employer], PA-C.  I have reviewed the above documentation for accuracy and completeness, and I agree with the above. Abby Potash, PA-C

## 2021-01-06 DIAGNOSIS — S52501D Unspecified fracture of the lower end of right radius, subsequent encounter for closed fracture with routine healing: Secondary | ICD-10-CM | POA: Diagnosis not present

## 2021-01-07 DIAGNOSIS — Z79899 Other long term (current) drug therapy: Secondary | ICD-10-CM | POA: Diagnosis not present

## 2021-01-07 DIAGNOSIS — Z135 Encounter for screening for eye and ear disorders: Secondary | ICD-10-CM | POA: Diagnosis not present

## 2021-01-07 DIAGNOSIS — H5203 Hypermetropia, bilateral: Secondary | ICD-10-CM | POA: Diagnosis not present

## 2021-01-07 DIAGNOSIS — E119 Type 2 diabetes mellitus without complications: Secondary | ICD-10-CM | POA: Diagnosis not present

## 2021-01-07 LAB — HM DIABETES EYE EXAM

## 2021-01-14 DIAGNOSIS — M25631 Stiffness of right wrist, not elsewhere classified: Secondary | ICD-10-CM | POA: Diagnosis not present

## 2021-01-18 DIAGNOSIS — H5213 Myopia, bilateral: Secondary | ICD-10-CM | POA: Diagnosis not present

## 2021-01-24 ENCOUNTER — Other Ambulatory Visit (INDEPENDENT_AMBULATORY_CARE_PROVIDER_SITE_OTHER): Payer: Self-pay | Admitting: Physician Assistant

## 2021-01-24 DIAGNOSIS — R7303 Prediabetes: Secondary | ICD-10-CM

## 2021-01-24 NOTE — Telephone Encounter (Signed)
Last seen Tracey 

## 2021-01-31 ENCOUNTER — Encounter (INDEPENDENT_AMBULATORY_CARE_PROVIDER_SITE_OTHER): Payer: Self-pay | Admitting: Physician Assistant

## 2021-01-31 ENCOUNTER — Other Ambulatory Visit (INDEPENDENT_AMBULATORY_CARE_PROVIDER_SITE_OTHER): Payer: Self-pay | Admitting: Physician Assistant

## 2021-01-31 DIAGNOSIS — R7303 Prediabetes: Secondary | ICD-10-CM

## 2021-01-31 DIAGNOSIS — E559 Vitamin D deficiency, unspecified: Secondary | ICD-10-CM

## 2021-02-01 MED ORDER — VITAMIN D (ERGOCALCIFEROL) 1.25 MG (50000 UNIT) PO CAPS: 50000.0000 [IU] | ORAL_CAPSULE | ORAL | 0 refills | Status: DC

## 2021-02-01 MED ORDER — VICTOZA 18 MG/3ML ~~LOC~~ SOPN: 1.2000 mg | PEN_INJECTOR | Freq: Every day | SUBCUTANEOUS | 0 refills | Status: DC

## 2021-02-01 NOTE — Telephone Encounter (Signed)
Okay to refill? Pt will be out before next appt.

## 2021-02-04 DIAGNOSIS — M25631 Stiffness of right wrist, not elsewhere classified: Secondary | ICD-10-CM | POA: Diagnosis not present

## 2021-02-07 ENCOUNTER — Ambulatory Visit (INDEPENDENT_AMBULATORY_CARE_PROVIDER_SITE_OTHER): Payer: Medicare Other | Admitting: Physician Assistant

## 2021-02-07 ENCOUNTER — Other Ambulatory Visit: Payer: Self-pay

## 2021-02-07 ENCOUNTER — Encounter (INDEPENDENT_AMBULATORY_CARE_PROVIDER_SITE_OTHER): Payer: Self-pay | Admitting: Physician Assistant

## 2021-02-07 VITALS — BP 127/79 | HR 80 | Temp 97.9°F | Ht 64.0 in | Wt 253.0 lb

## 2021-02-07 DIAGNOSIS — E559 Vitamin D deficiency, unspecified: Secondary | ICD-10-CM

## 2021-02-07 DIAGNOSIS — Z9189 Other specified personal risk factors, not elsewhere classified: Secondary | ICD-10-CM | POA: Diagnosis not present

## 2021-02-07 DIAGNOSIS — R7303 Prediabetes: Secondary | ICD-10-CM

## 2021-02-07 DIAGNOSIS — Z6841 Body Mass Index (BMI) 40.0 and over, adult: Secondary | ICD-10-CM

## 2021-02-07 DIAGNOSIS — E7849 Other hyperlipidemia: Secondary | ICD-10-CM | POA: Diagnosis not present

## 2021-02-07 MED ORDER — VITAMIN D3 125 MCG (5000 UT) PO CAPS: 5000.0000 [IU] | ORAL_CAPSULE | Freq: Every day | ORAL | 0 refills | Status: AC

## 2021-02-08 LAB — COMPREHENSIVE METABOLIC PANEL
ALT: 13 IU/L (ref 0–32)
AST: 20 IU/L (ref 0–40)
Albumin/Globulin Ratio: 1.7 (ref 1.2–2.2)
Albumin: 4 g/dL (ref 3.8–4.9)
Alkaline Phosphatase: 100 IU/L (ref 44–121)
BUN/Creatinine Ratio: 11 (ref 9–23)
BUN: 10 mg/dL (ref 6–24)
Bilirubin Total: 0.3 mg/dL (ref 0.0–1.2)
CO2: 24 mmol/L (ref 20–29)
Calcium: 8.9 mg/dL (ref 8.7–10.2)
Chloride: 102 mmol/L (ref 96–106)
Creatinine, Ser: 0.93 mg/dL (ref 0.57–1.00)
Globulin, Total: 2.4 g/dL (ref 1.5–4.5)
Glucose: 80 mg/dL (ref 65–99)
Potassium: 3.8 mmol/L (ref 3.5–5.2)
Sodium: 144 mmol/L (ref 134–144)
Total Protein: 6.4 g/dL (ref 6.0–8.5)
eGFR: 73 mL/min/{1.73_m2} (ref >59)

## 2021-02-08 LAB — HEMOGLOBIN A1C
Est. average glucose Bld gHb Est-mCnc: 105 mg/dL
Hgb A1c MFr Bld: 5.3 % (ref 4.8–5.6)

## 2021-02-08 LAB — LIPID PANEL
Chol/HDL Ratio: 3.7 ratio (ref 0.0–4.4)
Cholesterol, Total: 153 mg/dL (ref 100–199)
HDL: 41 mg/dL (ref >39)
LDL Chol Calc (NIH): 97 mg/dL (ref 0–99)
Triglycerides: 80 mg/dL (ref 0–149)
VLDL Cholesterol Cal: 15 mg/dL (ref 5–40)

## 2021-02-08 LAB — VITAMIN D 25 HYDROXY (VIT D DEFICIENCY, FRACTURES): Vit D, 25-Hydroxy: 42.1 ng/mL (ref 30.0–100.0)

## 2021-02-08 LAB — INSULIN, RANDOM: INSULIN: 7.1 u[IU]/mL (ref 2.6–24.9)

## 2021-02-09 NOTE — Progress Notes (Signed)
Chief Complaint:   OBESITY Joy [name] is here to discuss her progress with her obesity treatment plan along with follow-up of her obesity related diagnoses. Joy [name] is on the Category 2 Plan and states she is following her eating plan approximately 100% of the time. Joy [name] states she is walking for 60 minutes 5 times per week, and playing kickball for 45-60 minutes 1 time per week.  Today's visit was #: 23 Starting weight: 268 lbs Starting date: 12/16/2019 Today's weight: 253 lbs Today's date: 02/07/2021 Total lbs lost to date: 15 Total lbs lost since last in-office visit: 3  Interim History: Joy [name] did well with weight loss. She notes increasing her Victoza to 1.5 mg helped a great deal with her appetite control.  Subjective:   1. Pre-diabetes Joy [name] is on Victoza 1.5 mg, and her appetite is controlled. She denies nausea, vomiting, or diarrhea.  2. Vitamin D deficiency Joy [name] is on Vit D weekly. Last level was at goal.  3. Other hyperlipidemia Joy [name] is on atorvastatin, and she denies chest pain.  4. At risk for diabetes mellitus Joy [name] is at higher than average risk for developing diabetes due to obesity.   Assessment/Plan:   1. Pre-diabetes Joy [name] will continue Victoza 1.5 mg, and she will continue to work on weight loss, exercise, and decreasing simple carbohydrates to help decrease the risk of diabetes. We will check labs today.  - Comprehensive metabolic panel - Hemoglobin A1c - Insulin, random  2. Vitamin D deficiency Low Vitamin D level contributes to fatigue and are associated with obesity, breast, and colon cancer. Joy [name] agreed to discontinue weekly Vit D, and start Vitamin D 5,000 IU daily. We will check labs today, and she will follow-up for routine testing of Vitamin D, at least 2-3 times per year to avoid over-replacement.  - VITAMIN D 25 Hydroxy (Vit-D Deficiency, Fractures) - Cholecalciferol (VITAMIN D3) 125 MCG (5000 UT) CAPS;  Take 1 capsule (5,000 Units total) by mouth daily.  Dispense: 30 capsule; Refill: 0  3. Other hyperlipidemia Cardiovascular risk and specific lipid/LDL goals reviewed. We discussed several lifestyle modifications today. We will check labs today. Joy [name] will continue her medications, and will continue to work on diet, exercise and weight loss efforts. Orders and follow up as documented in patient record.   Counseling Intensive lifestyle modifications are the first line treatment for this issue. Dietary changes: Increase soluble fiber. Decrease simple carbohydrates. Exercise changes: Moderate to vigorous-intensity aerobic activity 150 minutes per week if tolerated. Lipid-lowering medications: see documented in medical record.  - Lipid panel  4. At risk for diabetes mellitus Joy [name] was given approximately 15 minutes of diabetes education and counseling today. We discussed intensive lifestyle modifications today with an emphasis on weight loss as well as increasing exercise and decreasing simple carbohydrates in her diet. We also reviewed medication options with an emphasis on risk versus benefit of those discussed.   Repetitive spaced learning was employed today to elicit superior memory formation and behavioral change.  5. Class 3 severe obesity with serious comorbidity and body mass index (BMI) of 45.0 to 49.9 in adult, unspecified obesity type (HCC) Joy [name] is currently in the action stage of change. As such, her goal is to continue with weight loss efforts. She has agreed to the Category 1 Plan.   We will recheck her IC at her next visit.  Exercise goals: As is.  Behavioral modification strategies: meal planning and cooking strategies and keeping healthy foods in the home.  Joy [name] has agreed  to follow-up with our clinic in 2 to 3 weeks. She was informed of the importance of frequent follow-up visits to maximize her success with intensive lifestyle modifications for her multiple  health conditions.   Joy [name] was informed we would discuss her lab results at her next visit unless there is a critical issue that needs to be addressed sooner. Joy [name] agreed to keep her next visit at the agreed upon time to discuss these results.  Objective:   Blood pressure 127/79, pulse 80, temperature 97.9 F (36.6 C), height 5\' 4"  (1.626 m), weight 253 lb (114.8 kg), SpO2 98 %. Body mass index is 43.43 kg/m.  General: Cooperative, alert, well developed, in no acute distress. HEENT: Conjunctivae and lids unremarkable. Cardiovascular: Regular rhythm.  Lungs: Normal work of breathing. Neurologic: No focal deficits.   Lab Results  Component Value Date   CREATININE 0.93 02/07/2021   BUN 10 02/07/2021   NA 144 02/07/2021   K 3.8 02/07/2021   CL 102 02/07/2021   CO2 24 02/07/2021   Lab Results  Component Value Date   ALT 13 02/07/2021   AST 20 02/07/2021   ALKPHOS 100 02/07/2021   BILITOT 0.3 02/07/2021   Lab Results  Component Value Date   HGBA1C 5.3 02/07/2021   HGBA1C 5.3 09/28/2020   HGBA1C 5.7 (H) 04/08/2020   HGBA1C 5.6 12/16/2019   Lab Results  Component Value Date   INSULIN 7.1 02/07/2021   INSULIN 10.9 09/28/2020   INSULIN 24.0 04/08/2020   INSULIN 11.8 12/16/2019   Lab Results  Component Value Date   TSH 2.100 12/16/2019   Lab Results  Component Value Date   CHOL 153 02/07/2021   HDL 41 02/07/2021   LDLCALC 97 02/07/2021   TRIG 80 02/07/2021   CHOLHDL 3.7 02/07/2021   Lab Results  Component Value Date   VD25OH 42.1 02/07/2021   VD25OH 66.6 09/28/2020   VD25OH 26.2 (L) 04/08/2020   Lab Results  Component Value Date   WBC 6.5 12/16/2019   HGB 11.4 12/16/2019   HCT 35.2 12/16/2019   MCV 93 12/16/2019   PLT 277 12/16/2019   Lab Results  Component Value Date   IRON 46 11/25/2019   TIBC 252 09/26/2013   FERRITIN 242.6 11/25/2019   Attestation Statements:   Reviewed by clinician on day of visit: allergies, medications, problem  list, medical history, surgical history, family history, social history, and previous encounter notes.   Wilhemena Durie, am acting as transcriptionist for [employer], PA-C.  I have reviewed the above documentation for accuracy and completeness, and I agree with the above. Abby Potash, PA-C

## 2021-02-17 DIAGNOSIS — Z4789 Encounter for other orthopedic aftercare: Secondary | ICD-10-CM | POA: Diagnosis not present

## 2021-02-17 DIAGNOSIS — S52501A Unspecified fracture of the lower end of right radius, initial encounter for closed fracture: Secondary | ICD-10-CM | POA: Diagnosis not present

## 2021-02-18 DIAGNOSIS — M0609 Rheumatoid arthritis without rheumatoid factor, multiple sites: Secondary | ICD-10-CM | POA: Diagnosis not present

## 2021-02-18 DIAGNOSIS — Z79899 Other long term (current) drug therapy: Secondary | ICD-10-CM | POA: Diagnosis not present

## 2021-02-18 DIAGNOSIS — M159 Polyosteoarthritis, unspecified: Secondary | ICD-10-CM | POA: Diagnosis not present

## 2021-02-21 ENCOUNTER — Other Ambulatory Visit: Payer: Self-pay | Admitting: *Deleted

## 2021-02-21 MED ORDER — ALBUTEROL SULFATE HFA 108 (90 BASE) MCG/ACT IN AERS: 2.0000 | INHALATION_SPRAY | RESPIRATORY_TRACT | 0 refills | Status: DC | PRN

## 2021-02-28 ENCOUNTER — Other Ambulatory Visit: Payer: Self-pay

## 2021-02-28 ENCOUNTER — Other Ambulatory Visit (INDEPENDENT_AMBULATORY_CARE_PROVIDER_SITE_OTHER): Payer: Self-pay | Admitting: Physician Assistant

## 2021-02-28 ENCOUNTER — Ambulatory Visit (INDEPENDENT_AMBULATORY_CARE_PROVIDER_SITE_OTHER): Payer: Medicare Other | Admitting: Physician Assistant

## 2021-02-28 ENCOUNTER — Encounter (INDEPENDENT_AMBULATORY_CARE_PROVIDER_SITE_OTHER): Payer: Self-pay | Admitting: Physician Assistant

## 2021-02-28 VITALS — BP 97/67 | HR 96 | Temp 98.1°F | Ht 64.0 in | Wt 247.0 lb

## 2021-02-28 DIAGNOSIS — Z6841 Body Mass Index (BMI) 40.0 and over, adult: Secondary | ICD-10-CM

## 2021-02-28 DIAGNOSIS — R7303 Prediabetes: Secondary | ICD-10-CM | POA: Diagnosis not present

## 2021-02-28 MED ORDER — VICTOZA 18 MG/3ML ~~LOC~~ SOPN: 1.2000 mg | PEN_INJECTOR | Freq: Every day | SUBCUTANEOUS | 0 refills | Status: DC

## 2021-02-28 MED ORDER — BD PEN NEEDLE NANO 2ND GEN 32G X 4 MM MISC: 0 refills | Status: DC

## 2021-03-06 IMAGING — DX DG CHEST 1V PORT
1 series · 1 of 1 positions shown · non-contrast
Comparison: Chest radiographs 02/04/2019 and earlier.

CLINICAL DATA: 54-year-old female with cough. Shortness of breath.
Tested negative for [id number]-M2 08/09/2020.

EXAM:
PORTABLE CHEST 1 VIEW

[chest ap]
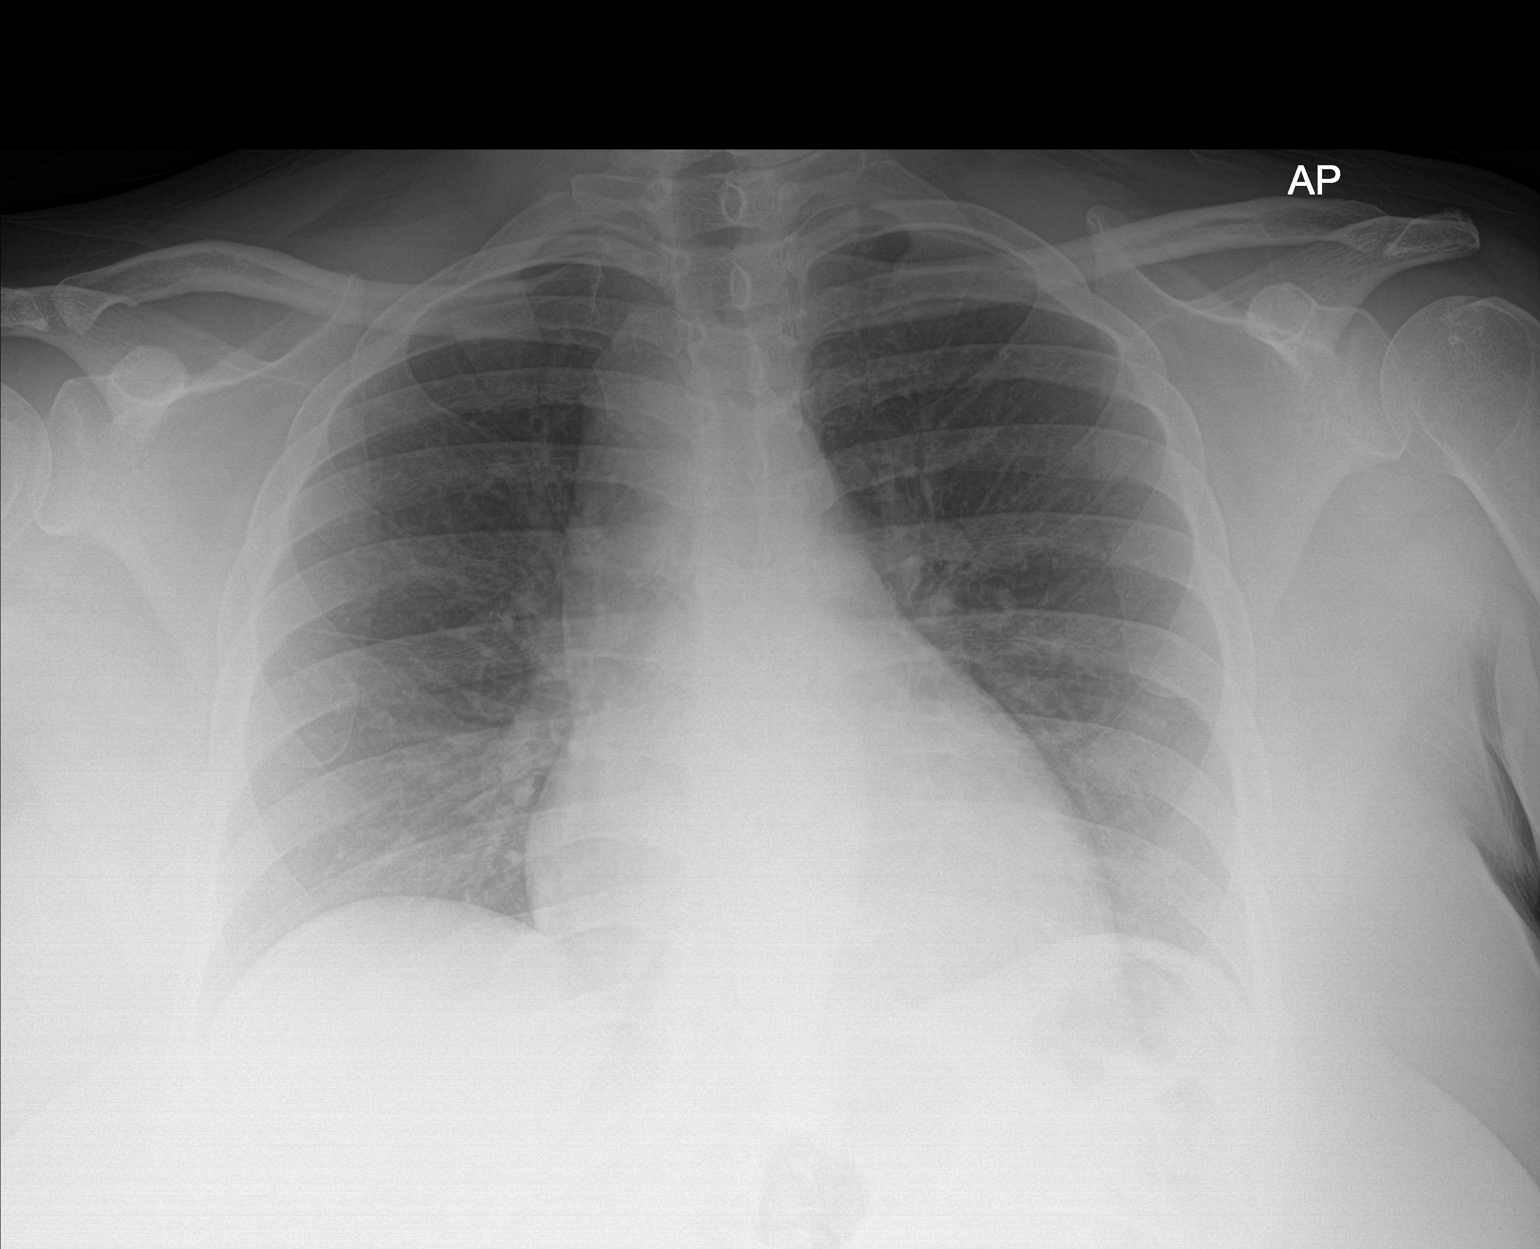

[1 of 1 positions shown; findings below may reference images not displayed]

FINDINGS: Portable AP upright view at 7980 hours.

Lung volumes and mediastinal contours are stable and normal aside
from question of congenital right side aortic arch, uncertain. The
aorta does descend on the left confirmed on 8711 CT Abdomen and
Pelvis.

Visualized tracheal air column is within normal limits. Allowing for
portable technique the lungs are clear. No pneumothorax. Negative
visible bowel gas pattern and osseous structures.
IMPRESSION: Negative portable chest.

## 2021-03-08 NOTE — Progress Notes (Signed)
Chief Complaint:   OBESITY Clifford is here to discuss her progress with her obesity treatment plan along with follow-up of her obesity related diagnoses. Jeylah is on the Category 1 Plan and states she is following her eating plan approximately 60% of the time. Zamia states she is walking for 60 minutes 5 times per week and doing chair yoga 1 time per week.  Today's visit was #: 24 Starting weight: 268 lbs Starting date: 12/16/2019 Today's weight: 247 lbs Today's date: 02/28/2021 Total lbs lost to date: 21 Total lbs lost since last in-office visit: 6  Interim History: Jazzlin reports feeling good about her weight loss, but not about how well she followed the plan. She is developing an aversion to meat and she is making herself eat.  Subjective:   1. Pre-diabetes Linell is on Victoza 1.5 mg and her appetite is controlled.  Assessment/Plan:   1. Pre-diabetes Niasha will continue to work on weight loss, exercise, and decreasing simple carbohydrates to help decrease the risk of diabetes. We will refill Victoza for 1 month, and refill pen needles #100 with no refill.  - liraglutide (VICTOZA) 18 MG/3ML SOPN; Inject 1.2 mg into the skin daily.  Dispense: 6 mL; Refill: 0 - Insulin Pen Needle (BD PEN NEEDLE NANO 2ND GEN) 32G X 4 MM MISC; Use 1 needle daily to inject Victoza.  Dispense: 100 each; Refill: 0  2. Class 3 severe obesity with serious comorbidity and body mass index (BMI) of 45.0 to 49.9 in adult, unspecified obesity type ([place]), current bmi 42.38 Devlyn is currently in the action stage of change. As such, her goal is to continue with weight loss efforts. She has agreed to the Category 1 Plan.   We will recheck her IC next month.  Exercise goals: As is.  Behavioral modification strategies: meal planning and cooking strategies and keeping healthy foods in the home.  Cheryll has agreed to follow-up with our clinic in 3 weeks. She was informed of the  importance of frequent follow-up visits to maximize her success with intensive lifestyle modifications for her multiple health conditions.   Objective:   Blood pressure 97/67, pulse 96, temperature 98.1 F (36.7 C), height '5\' 4"'$  (1.626 m), weight 247 lb (112 kg), SpO2 98 %. Body mass index is 42.4 kg/m.  General: Cooperative, alert, well developed, in no acute distress. HEENT: Conjunctivae and lids unremarkable. Cardiovascular: Regular rhythm.  Lungs: Normal work of breathing. Neurologic: No focal deficits.   Lab Results  Component Value Date   CREATININE 0.93 02/07/2021   BUN 10 02/07/2021   NA 144 02/07/2021   K 3.8 02/07/2021   CL 102 02/07/2021   CO2 24 02/07/2021   Lab Results  Component Value Date   ALT 13 02/07/2021   AST 20 02/07/2021   ALKPHOS 100 02/07/2021   BILITOT 0.3 02/07/2021   Lab Results  Component Value Date   HGBA1C 5.3 02/07/2021   HGBA1C 5.3 09/28/2020   HGBA1C 5.7 (H) 04/08/2020   HGBA1C 5.6 12/16/2019   Lab Results  Component Value Date   INSULIN 7.1 02/07/2021   INSULIN 10.9 09/28/2020   INSULIN 24.0 04/08/2020   INSULIN 11.8 12/16/2019   Lab Results  Component Value Date   TSH 2.100 12/16/2019   Lab Results  Component Value Date   CHOL 153 02/07/2021   HDL 41 02/07/2021   LDLCALC 97 02/07/2021   TRIG 80 02/07/2021   CHOLHDL 3.7 02/07/2021   Lab Results  Component Value  Date   VD25OH 42.1 02/07/2021   VD25OH 66.6 09/28/2020   VD25OH 26.2 (L) 04/08/2020   Lab Results  Component Value Date   WBC 6.5 12/16/2019   HGB 11.4 12/16/2019   HCT 35.2 12/16/2019   MCV 93 12/16/2019   PLT 277 12/16/2019   Lab Results  Component Value Date   IRON 46 11/25/2019   TIBC 252 09/26/2013   FERRITIN 242.6 11/25/2019    Obesity Behavioral Intervention:   Approximately 15 minutes were spent on the discussion below.  ASK: We discussed the diagnosis of obesity with Kirrah today and Tyffani agreed to give [name] permission to discuss  obesity behavioral modification therapy today.  ASSESS: Cathia has the diagnosis of obesity and her BMI today is 42.38. Waynetta is in the action stage of change.   ADVISE: Sayuri was educated on the multiple health risks of obesity as well as the benefit of weight loss to improve her health. She was advised of the need for long term treatment and the importance of lifestyle modifications to improve her current health and to decrease her risk of future health problems.  AGREE: Multiple dietary modification options and treatment options were discussed and Shylyn agreed to follow the recommendations documented in the above note.  ARRANGE: Eila was educated on the importance of frequent visits to treat obesity as outlined per CMS and USPSTF guidelines and agreed to schedule her next follow up appointment today.  Attestation Statements:   Reviewed by clinician on day of visit: allergies, medications, problem list, medical history, surgical history, family history, social history, and previous encounter notes.   Wilhemena Durie, am acting as transcriptionist for [employer], PA-C.  I have reviewed the above documentation for accuracy and completeness, and I agree with the above. Abby Potash, PA-C

## 2021-03-17 ENCOUNTER — Other Ambulatory Visit (INDEPENDENT_AMBULATORY_CARE_PROVIDER_SITE_OTHER): Payer: Self-pay | Admitting: Physician Assistant

## 2021-03-17 DIAGNOSIS — R7303 Prediabetes: Secondary | ICD-10-CM

## 2021-03-18 ENCOUNTER — Other Ambulatory Visit: Payer: Self-pay

## 2021-03-18 ENCOUNTER — Ambulatory Visit (INDEPENDENT_AMBULATORY_CARE_PROVIDER_SITE_OTHER): Payer: Medicare Other | Admitting: Family

## 2021-03-18 ENCOUNTER — Encounter: Payer: Self-pay | Admitting: Family

## 2021-03-18 VITALS — BP 110/69 | HR 73 | Temp 98.4°F | Resp 16 | Ht 64.0 in | Wt 250.0 lb

## 2021-03-18 DIAGNOSIS — Z0000 Encounter for general adult medical examination without abnormal findings: Secondary | ICD-10-CM | POA: Diagnosis not present
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
## 2021-03-21 ENCOUNTER — Other Ambulatory Visit: Payer: Self-pay

## 2021-03-21 MED ORDER — VICTOZA 18 MG/3ML ~~LOC~~ SOPN
1.2000 mg | PEN_INJECTOR | Freq: Every day | SUBCUTANEOUS | 0 refills | Status: DC
Start: 2021-03-21 — End: 2021-03-29
  Filled 2021-03-21 – 2021-03-23 (×2): qty 6, 30d supply, fill #0

## 2021-03-21 NOTE — Telephone Encounter (Signed)
LAST APPOINTMENT DATE: 02/28/2021 NEXT APPOINTMENT DATE: 04/04/2021   [facility] - [place], [place] [address] [place] [address] Phone: [phone] Fax: [phone]  [id number] - [place], [place] - [address] AT [place] [address] [place] [address] Phone: [phone] Fax: [phone]  [facility]  ([place], [place] [address] Phone: [phone] Fax: [place] [address] [place] [address] Phone: [phone] Fax: [phone]  Patient is requesting a refill of the following medications: Requested Prescriptions   Pending Prescriptions Disp Refills   liraglutide (VICTOZA) 18 MG/3ML SOPN 6 mL 0    Sig: Inject 1.2 mg into the skin daily.    Date last filled: 02/28/2021 Previously prescribed by Abby Potash  Lab Results  Component Value Date   HGBA1C 5.3 02/07/2021   HGBA1C 5.3 09/28/2020   HGBA1C 5.7 (H) 04/08/2020   Lab Results  Component Value Date   LDLCALC 97 02/07/2021   CREATININE 0.93 02/07/2021   Lab Results  Component Value Date   VD25OH 42.1 02/07/2021   VD25OH 66.6 09/28/2020   VD25OH 26.2 (L) 04/08/2020    BP Readings from Last 3 Encounters:  03/18/21 110/69  02/28/21 97/67  02/07/21 127/79

## 2021-03-23 ENCOUNTER — Other Ambulatory Visit: Payer: Self-pay

## 2021-03-23 DIAGNOSIS — R21 Rash and other nonspecific skin eruption: Secondary | ICD-10-CM | POA: Diagnosis not present

## 2021-03-27 ENCOUNTER — Other Ambulatory Visit: Payer: Self-pay | Admitting: Family

## 2021-03-29 ENCOUNTER — Other Ambulatory Visit: Payer: Self-pay

## 2021-03-29 ENCOUNTER — Encounter (INDEPENDENT_AMBULATORY_CARE_PROVIDER_SITE_OTHER): Payer: Self-pay | Admitting: Adult Health

## 2021-03-29 ENCOUNTER — Ambulatory Visit (INDEPENDENT_AMBULATORY_CARE_PROVIDER_SITE_OTHER): Payer: Medicare Other | Admitting: Adult Health

## 2021-03-29 ENCOUNTER — Other Ambulatory Visit (INDEPENDENT_AMBULATORY_CARE_PROVIDER_SITE_OTHER): Payer: Self-pay | Admitting: Adult Health

## 2021-03-29 VITALS — BP 120/82 | HR 79 | Temp 98.2°F | Ht 64.0 in | Wt 249.0 lb

## 2021-03-29 DIAGNOSIS — R7303 Prediabetes: Secondary | ICD-10-CM

## 2021-03-29 DIAGNOSIS — Z6841 Body Mass Index (BMI) 40.0 and over, adult: Secondary | ICD-10-CM

## 2021-03-29 MED ORDER — VICTOZA 18 MG/3ML ~~LOC~~ SOPN: 3.0000 mg | PEN_INJECTOR | Freq: Every day | SUBCUTANEOUS | 0 refills | Status: DC

## 2021-03-30 ENCOUNTER — Encounter (INDEPENDENT_AMBULATORY_CARE_PROVIDER_SITE_OTHER): Payer: Self-pay | Admitting: Adult Health

## 2021-03-30 ENCOUNTER — Other Ambulatory Visit: Payer: Self-pay

## 2021-03-30 NOTE — Progress Notes (Signed)
Chief Complaint:   OBESITY Joy [name] is here to discuss her progress with her obesity treatment plan along with follow-up of her obesity related diagnoses. Joy [name] is on the Category 1 Plan and states she is following her eating plan approximately 100% of the time. Joy [name] states she is walking 60 minutes 5 times per week.  Today's visit was #: 25 Starting weight: 268 lbs Starting date: 12/16/2019 Today's weight: 249 lbs Today's date: 03/29/2021 Total lbs lost to date: 19 Total lbs lost since last in-office visit: 0  Interim History: Joy [name] is on Victoza 1.2 mg with 5 additional clicks, approximately 1.5 mg QD.  She is still experiencing breakthrough polyphagia. She converted from category 1 to pescatarian due to growing tired of meat. Of Note: Recommended at last OV to check IC- has an upcoming appt to complete this. She is aware to arrive 30 mins prior to her scheduled appt time to have this completed.  Subjective:   1. Pre-diabetes Joy [name] is on Victoza 1.2 mg with 5 additional clicks, and is still experiencing breakthrough polyphagia.   02/07/2021 A1c 5.3- at goal!  Assessment/Plan:   1. Pre-diabetes Joy [name] will continue to work on weight loss, exercise, and decreasing simple carbohydrates to help decrease the risk of diabetes. Pt knows to only inject 1.8 mg QD and hold at this dose until next OV.  Refill- liraglutide (VICTOZA) 18 MG/3ML SOPN; Inject 3 mg into the skin daily. Pt knows to inject 1.'8mg'$  daily  Dispense: 15 mL; Refill: 0  2. Class 3 severe obesity with serious comorbidity and body mass index (BMI) of 40.0 to 44.9 in adult, unspecified obesity type (H[facility])  Joy [name] is currently in the action stage of change. As such, her goal is to continue with weight loss efforts. She has agreed to the [name].   Exercise goals:  As is  Behavioral modification strategies: increasing lean protein intake, decreasing simple carbohydrates, meal planning and  cooking strategies, keeping healthy foods in the home, and planning for success.  Joy [name] has agreed to follow-up with our clinic in 4 weeks. She was informed of the importance of frequent follow-up visits to maximize her success with intensive lifestyle modifications for her multiple health conditions.   Objective:   Blood pressure 120/82, pulse 79, temperature 98.2 F (36.8 C), height '5\' 4"'$  (1.626 m), weight 249 lb (112.9 kg), SpO2 96 %. Body mass index is 42.74 kg/m.  General: Cooperative, alert, well developed, in no acute distress. HEENT: Conjunctivae and lids unremarkable. Cardiovascular: Regular rhythm.  Lungs: Normal work of breathing. Neurologic: No focal deficits.   Lab Results  Component Value Date   CREATININE 0.93 02/07/2021   BUN 10 02/07/2021   NA 144 02/07/2021   K 3.8 02/07/2021   CL 102 02/07/2021   CO2 24 02/07/2021   Lab Results  Component Value Date   ALT 13 02/07/2021   AST 20 02/07/2021   ALKPHOS 100 02/07/2021   BILITOT 0.3 02/07/2021   Lab Results  Component Value Date   HGBA1C 5.3 02/07/2021   HGBA1C 5.3 09/28/2020   HGBA1C 5.7 (H) 04/08/2020   HGBA1C 5.6 12/16/2019   Lab Results  Component Value Date   INSULIN 7.1 02/07/2021   INSULIN 10.9 09/28/2020   INSULIN 24.0 04/08/2020   INSULIN 11.8 12/16/2019   Lab Results  Component Value Date   TSH 2.100 12/16/2019   Lab Results  Component Value Date   CHOL 153 02/07/2021   HDL 41 02/07/2021   [place]  97 02/07/2021   TRIG 80 02/07/2021   CHOLHDL 3.7 02/07/2021   Lab Results  Component Value Date   VD25OH 42.1 02/07/2021   VD25OH 66.6 09/28/2020   VD25OH 26.2 (L) 04/08/2020   Lab Results  Component Value Date   WBC 6.5 12/16/2019   HGB 11.4 12/16/2019   HCT 35.2 12/16/2019   MCV 93 12/16/2019   PLT 277 12/16/2019   Lab Results  Component Value Date   IRON 46 11/25/2019   TIBC 252 09/26/2013   FERRITIN 242.6 11/25/2019    Obesity Behavioral Intervention:    Approximately 15 minutes were spent on the discussion below.  ASK: We discussed the diagnosis of obesity with Joy [name] today and Joy [name] agreed to give [name] permission to discuss obesity behavioral modification therapy today.  ASSESS: Joy [name] has the diagnosis of obesity and her BMI today is 42.8. Joy [name] is in the action stage of change.   ADVISE: Joy [name] was educated on the multiple health risks of obesity as well as the benefit of weight loss to improve her health. She was advised of the need for long term treatment and the importance of lifestyle modifications to improve her current health and to decrease her risk of future health problems.  AGREE: Multiple dietary modification options and treatment options were discussed and Joy [name] agreed to follow the recommendations documented in the above note.  ARRANGE: Joy [name] was educated on the importance of frequent visits to treat obesity as outlined per CMS and USPSTF guidelines and agreed to schedule her next follow up appointment today.  Attestation Statements:   Reviewed by clinician on day of visit: allergies, medications, problem list, medical history, surgical history, family history, social history, and previous encounter notes.  Coral Ceo, CMA, am acting as transcriptionist for Mina Marble, NP.  I have reviewed the above documentation for accuracy and completeness, and I agree with the above. -  Aalijah Lanphere d. Safari Cinque, NP-C

## 2021-04-04 ENCOUNTER — Ambulatory Visit (INDEPENDENT_AMBULATORY_CARE_PROVIDER_SITE_OTHER): Payer: Medicare Other | Admitting: Physician Assistant

## 2021-04-04 DIAGNOSIS — H52223 Regular astigmatism, bilateral: Secondary | ICD-10-CM | POA: Diagnosis not present

## 2021-04-04 DIAGNOSIS — H524 Presbyopia: Secondary | ICD-10-CM | POA: Diagnosis not present

## 2021-04-21 ENCOUNTER — Ambulatory Visit (INDEPENDENT_AMBULATORY_CARE_PROVIDER_SITE_OTHER): Payer: Medicare Other

## 2021-04-21 VITALS — Ht 64.0 in | Wt 249.0 lb

## 2021-04-21 DIAGNOSIS — Z0000 Encounter for general adult medical examination without abnormal findings: Secondary | ICD-10-CM | POA: Diagnosis not present

## 2021-04-21 NOTE — Patient Instructions (Signed)
Joy [name] , Thank you for taking time to complete your Medicare Wellness Visit. I appreciate your ongoing commitment to your health goals. Please review the following plan we discussed and let me know if I can assist you in the future.   Screening recommendations/referrals: Colonoscopy: Completed 07/31/2019-Due 07/30/2029 Mammogram: Scheduled for 05/17/2021 Bone Density: Completed 11/26/2019 Recommended yearly ophthalmology/optometry visit for glaucoma screening and checkup Recommended yearly dental visit for hygiene and checkup  Vaccinations: Influenza vaccine:-Due-May obtain vaccine at our office or your local pharmacy  Pneumococcal vaccine: Due at age 84 Tdap vaccine: Up to date-Due 12/2029 Shingles vaccine: Completed vaccines  Covid-19: Booster due  Advanced directives: Declined information today  Conditions/risks identified: See problem list  Next appointment: Follow up in one year for your annual wellness visit.   Preventive Care 40-64 Years, Female Preventive care refers to lifestyle choices and visits with your health care provider that can promote health and wellness. What does preventive care include? A yearly physical exam. This is also called an annual well check. Dental exams once or twice a year. Routine eye exams. Ask your health care provider how often you should have your eyes checked. Personal lifestyle choices, including: Daily care of your teeth and gums. Regular physical activity. Eating a healthy diet. Avoiding tobacco and drug use. Limiting alcohol use. Practicing safe sex. Taking low-dose aspirin daily starting at age 58. Taking vitamin and mineral supplements as recommended by your health care provider. What happens during an annual well check? The services and screenings done by your health care provider during your annual well check will depend on your age, overall health, lifestyle risk factors, and family history of disease. Counseling  Your  health care provider may ask you questions about your: Alcohol use. Tobacco use. Drug use. Emotional well-being. Home and relationship well-being. Sexual activity. Eating habits. Work and work Statistician. Method of birth control. Menstrual cycle. Pregnancy history. Screening  You may have the following tests or measurements: Height, weight, and BMI. Blood pressure. Lipid and cholesterol levels. These may be checked every 5 years, or more frequently if you are over 80 years old. Skin check. Lung cancer screening. You may have this screening every year starting at age 47 if you have a 30-pack-year history of smoking and currently smoke or have quit within the past 15 years. Fecal occult blood test (FOBT) of the stool. You may have this test every year starting at age 75. Flexible sigmoidoscopy or colonoscopy. You may have a sigmoidoscopy every 5 years or a colonoscopy every 10 years starting at age 101. Hepatitis C blood test. Hepatitis B blood test. Sexually transmitted disease (STD) testing. Diabetes screening. This is done by checking your blood sugar (glucose) after you have not eaten for a while (fasting). You may have this done every 1-3 years. Mammogram. This may be done every 1-2 years. Talk to your health care provider about when you should start having regular mammograms. This may depend on whether you have a family history of breast cancer. BRCA-related cancer screening. This may be done if you have a family history of breast, ovarian, tubal, or peritoneal cancers. Pelvic exam and Pap test. This may be done every 3 years starting at age 71. Starting at age 56, this may be done every 5 years if you have a Pap test in combination with an HPV test. Bone density scan. This is done to screen for osteoporosis. You may have this scan if you are at high risk for osteoporosis. Discuss your  test results, treatment options, and if necessary, the need for more tests with your health care  provider. Vaccines  Your health care provider may recommend certain vaccines, such as: Influenza vaccine. This is recommended every year. Tetanus, diphtheria, and acellular pertussis (Tdap, Td) vaccine. You may need a Td booster every 10 years. Zoster vaccine. You may need this after age 29. Pneumococcal 13-valent conjugate (PCV13) vaccine. You may need this if you have certain conditions and were not previously vaccinated. Pneumococcal polysaccharide (PPSV23) vaccine. You may need one or two doses if you smoke cigarettes or if you have certain conditions. Talk to your health care provider about which screenings and vaccines you need and how often you need them. This information is not intended to replace advice given to you by your health care provider. Make sure you discuss any questions you have with your health care provider. Document Released: 08/27/2015 Document Revised: 04/19/2016 Document Reviewed: 06/01/2015 Elsevier Interactive Patient Education  2017 [place] Prevention in the Home Falls can cause injuries. They can happen to people of all ages. There are many things you can do to make your home safe and to help prevent falls. What can I do on the outside of my home? Regularly fix the edges of walkways and driveways and fix any cracks. Remove anything that might make you trip as you walk through a door, such as a raised step or threshold. Trim any bushes or trees on the path to your home. Use bright outdoor lighting. Clear any walking paths of anything that might make someone trip, such as rocks or tools. Regularly check to see if handrails are loose or broken. Make sure that both sides of any steps have handrails. Any raised decks and porches should have guardrails on the edges. Have any leaves, snow, or ice cleared regularly. Use sand or salt on walking paths during winter. Clean up any spills in your garage right away. This includes oil or grease spills. What  can I do in the bathroom? Use night lights. Install grab bars by the toilet and in the tub and shower. Do not use towel bars as grab bars. Use non-skid mats or decals in the tub or shower. If you need to sit down in the shower, use a plastic, non-slip stool. Keep the floor dry. Clean up any water that spills on the floor as soon as it happens. Remove soap buildup in the tub or shower regularly. Attach bath mats securely with double-sided non-slip rug tape. Do not have throw rugs and other things on the floor that can make you trip. What can I do in the bedroom? Use night lights. Make sure that you have a light by your bed that is easy to reach. Do not use any sheets or blankets that are too big for your bed. They should not hang down onto the floor. Have a firm chair that has side arms. You can use this for support while you get dressed. Do not have throw rugs and other things on the floor that can make you trip. What can I do in the kitchen? Clean up any spills right away. Avoid walking on wet floors. Keep items that you use a lot in easy-to-reach places. If you need to reach something above you, use a strong step stool that has a grab bar. Keep electrical cords out of the way. Do not use floor polish or wax that makes floors slippery. If you must use wax, use non-skid  floor wax. Do not have throw rugs and other things on the floor that can make you trip. What can I do with my stairs? Do not leave any items on the stairs. Make sure that there are handrails on both sides of the stairs and use them. Fix handrails that are broken or loose. Make sure that handrails are as long as the stairways. Check any carpeting to make sure that it is firmly attached to the stairs. Fix any carpet that is loose or worn. Avoid having throw rugs at the top or bottom of the stairs. If you do have throw rugs, attach them to the floor with carpet tape. Make sure that you have a light switch at the top of the  stairs and the bottom of the stairs. If you do not have them, ask someone to add them for you. What else can I do to help prevent falls? Wear shoes that: Do not have high heels. Have rubber bottoms. Are comfortable and fit you well. Are closed at the toe. Do not wear sandals. If you use a stepladder: Make sure that it is fully opened. Do not climb a closed stepladder. Make sure that both sides of the stepladder are locked into place. Ask someone to hold it for you, if possible. Clearly mark and make sure that you can see: Any grab bars or handrails. First and last steps. Where the edge of each step is. Use tools that help you move around (mobility aids) if they are needed. These include: Canes. Walkers. Scooters. Crutches. Turn on the lights when you go into a dark area. Replace any light bulbs as soon as they burn out. Set up your furniture so you have a clear path. Avoid moving your furniture around. If any of your floors are uneven, fix them. If there are any pets around you, be aware of where they are. Review your medicines with your doctor. Some medicines can make you feel dizzy. This can increase your chance of falling. Ask your doctor what other things that you can do to help prevent falls. This information is not intended to replace advice given to you by your health care provider. Make sure you discuss any questions you have with your health care provider. Document Released: 05/27/2009 Document Revised: 01/06/2016 Document Reviewed: 09/04/2014 Elsevier Interactive Patient Education  2017 [employer].
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
## 2021-04-25 ENCOUNTER — Ambulatory Visit (INDEPENDENT_AMBULATORY_CARE_PROVIDER_SITE_OTHER): Payer: Medicare Other | Admitting: Family Medicine

## 2021-04-25 ENCOUNTER — Other Ambulatory Visit: Payer: Self-pay

## 2021-04-25 ENCOUNTER — Encounter (INDEPENDENT_AMBULATORY_CARE_PROVIDER_SITE_OTHER): Payer: Self-pay | Admitting: Family Medicine

## 2021-04-25 VITALS — BP 109/74 | HR 80 | Temp 98.1°F | Ht 64.0 in | Wt 250.0 lb

## 2021-04-25 DIAGNOSIS — R0602 Shortness of breath: Secondary | ICD-10-CM

## 2021-04-25 DIAGNOSIS — Z6841 Body Mass Index (BMI) 40.0 and over, adult: Secondary | ICD-10-CM

## 2021-04-25 DIAGNOSIS — R7303 Prediabetes: Secondary | ICD-10-CM | POA: Diagnosis not present

## 2021-04-25 MED ORDER — VICTOZA 18 MG/3ML ~~LOC~~ SOPN: 3.0000 mg | PEN_INJECTOR | Freq: Every day | SUBCUTANEOUS | 0 refills | Status: DC

## 2021-04-25 NOTE — Progress Notes (Signed)
Chief Complaint:   OBESITY Joy [name] is here to discuss her progress with her obesity treatment plan along with follow-up of her obesity related diagnoses. Joy [name] is on the [employer] and states she is following her eating plan approximately 95% of the time. Joy [name] states she is walking 60 minutes 5 times per week.  Today's visit was #: 25 Starting weight: 268 lbs Starting date: 12/16/2019 Today's weight: 250 lbs Today's date: 04/25/2021 Total lbs lost to date: 18 Total lbs lost since last in-office visit: 0  Interim History: Since her last appt, Joy [name] has been trying Pescatarian plan. She is getting tired of chicken and doesn't eat fish. She has not tried any vegetable options. She has no upcoming plans. Pt has a membership to [employer] and can add in Charity fundraiser.  Subjective:   1. SOB (shortness of breath) Slight improvement from initial appt.  2. Pre-diabetes Pt is on Victoza 1.8 mg. She denies hunger or GI side effects.  Assessment/Plan:   1. SOB (shortness of breath) Joy [name] does feel that she gets out of breath more easily that she used to when she exercises. Joy [name]'s shortness of breath appears to be obesity related and exercise induced. She has agreed to work on weight loss and gradually increase exercise to treat her exercise induced shortness of breath. Will continue to monitor closely. IC today.  2. Pre-diabetes Joy [name] will increase Victoza to 2.1 mg. She will continue to work on weight loss, exercise, and decreasing simple carbohydrates to help decrease the risk of diabetes.   Increase and Refill- liraglutide (VICTOZA) 18 MG/3ML SOPN; Inject 3 mg into the skin daily. Pt knows to inject 2.'1mg'$  daily  Dispense: 15 mL; Refill: 0  3. Obesity with current BMI of 42.9  Joy [name] is currently in the action stage of change. As such, her goal is to continue with weight loss efforts. She has agreed to the Category 2 Plan and the [place].    Exercise goals:  As is- Add in resistance training 10-15 minutes 2-3 times a week.  Behavioral modification strategies: increasing lean protein intake, meal planning and cooking strategies, and keeping healthy foods in the home.  Joy [name] has agreed to follow-up with our clinic in 3 weeks. She was informed of the importance of frequent follow-up visits to maximize her success with intensive lifestyle modifications for her multiple health conditions.   Objective:   Blood pressure 109/74, pulse 80, temperature 98.1 F (36.7 C), height '5\' 4"'$  (1.626 m), weight 250 lb (113.4 kg), SpO2 99 %. Body mass index is 42.91 kg/m.  General: Cooperative, alert, well developed, in no acute distress. HEENT: Conjunctivae and lids unremarkable. Cardiovascular: Regular rhythm.  Lungs: Normal work of breathing. Neurologic: No focal deficits.   Lab Results  Component Value Date   CREATININE 0.93 02/07/2021   BUN 10 02/07/2021   NA 144 02/07/2021   K 3.8 02/07/2021   CL 102 02/07/2021   CO2 24 02/07/2021   Lab Results  Component Value Date   ALT 13 02/07/2021   AST 20 02/07/2021   ALKPHOS 100 02/07/2021   BILITOT 0.3 02/07/2021   Lab Results  Component Value Date   HGBA1C 5.3 02/07/2021   HGBA1C 5.3 09/28/2020   HGBA1C 5.7 (H) 04/08/2020   HGBA1C 5.6 12/16/2019   Lab Results  Component Value Date   INSULIN 7.1 02/07/2021   INSULIN 10.9 09/28/2020   INSULIN 24.0 04/08/2020   INSULIN 11.8 12/16/2019   Lab Results  Component Value  Date   TSH 2.100 12/16/2019   Lab Results  Component Value Date   CHOL 153 02/07/2021   HDL 41 02/07/2021   LDLCALC 97 02/07/2021   TRIG 80 02/07/2021   CHOLHDL 3.7 02/07/2021   Lab Results  Component Value Date   VD25OH 42.1 02/07/2021   VD25OH 66.6 09/28/2020   VD25OH 26.2 (L) 04/08/2020   Lab Results  Component Value Date   WBC 6.5 12/16/2019   HGB 11.4 12/16/2019   HCT 35.2 12/16/2019   MCV 93 12/16/2019   PLT 277 12/16/2019   Lab  Results  Component Value Date   IRON 46 11/25/2019   TIBC 252 09/26/2013   FERRITIN 242.6 11/25/2019    Obesity Behavioral Intervention:   Approximately 15 minutes were spent on the discussion below.  ASK: We discussed the diagnosis of obesity with Joy [name] today and Joy [name] agreed to give [name] permission to discuss obesity behavioral modification therapy today.  ASSESS: Joy [name] has the diagnosis of obesity and her BMI today is 42.9. Joy [name] is in the action stage of change.   ADVISE: Joy [name] was educated on the multiple health risks of obesity as well as the benefit of weight loss to improve her health. She was advised of the need for long term treatment and the importance of lifestyle modifications to improve her current health and to decrease her risk of future health problems.  AGREE: Multiple dietary modification options and treatment options were discussed and Joy [name] agreed to follow the recommendations documented in the above note.  ARRANGE: Joy [name] was educated on the importance of frequent visits to treat obesity as outlined per CMS and USPSTF guidelines and agreed to schedule her next follow up appointment today.  Attestation Statements:   Reviewed by clinician on day of visit: allergies, medications, problem list, medical history, surgical history, family history, social history, and previous encounter notes.  Coral Ceo, CMA, am acting as transcriptionist for Coralie Common, MD.   I have reviewed the above documentation for accuracy and completeness, and I agree with the above. - Coralie Common, MD

## 2021-04-26 ENCOUNTER — Encounter (INDEPENDENT_AMBULATORY_CARE_PROVIDER_SITE_OTHER): Payer: Self-pay

## 2021-05-07 ENCOUNTER — Other Ambulatory Visit (INDEPENDENT_AMBULATORY_CARE_PROVIDER_SITE_OTHER): Payer: Self-pay | Admitting: Family Medicine

## 2021-05-07 DIAGNOSIS — E7849 Other hyperlipidemia: Secondary | ICD-10-CM

## 2021-05-09 NOTE — Telephone Encounter (Signed)
Pt last seen by Dr. Ukleja.  

## 2021-05-09 NOTE — Telephone Encounter (Signed)
LAST APPOINTMENT DATE: 9*12/22 NEXT APPOINTMENT DATE: 05/24/21   [facility] - [place], [place] [address] [place] [address] Phone: [phone] Fax: [phone]  [id number] - [place], [place] - [address] AT [place] [address] [place] [address] Phone: [phone] Fax: [phone]  [facility]  ([place], [place] [address] Phone: [phone] Fax: [place] [address], [place] [address] Phone: [phone] Fax: [phone]  Patient is requesting a refill of the following medications: Pending Prescriptions:                       Disp   Refills   atorvastatin (LIPITOR) 20 MG tablet        30 tab*0         Date last filled: 11/16/20 Previously prescribed by Dr Jearld Shines  Lab Results      Component                Value               Date                      HGBA1C                   5.3                 02/07/2021                HGBA1C                   5.3                 09/28/2020                HGBA1C                   5.7 (H)             04/08/2020           Lab Results      Component                Value               Date                      LDLCALC                  97                  02/07/2021                CREATININE               0.93                02/07/2021           Lab Results      Component                Value               Date  VD25OH                   42.1                02/07/2021                VD25OH                   66.6                09/28/2020                VD25OH                   26.2 (L)            04/08/2020            BP Readings from Last 3 Encounters: 04/25/21 : 109/74 03/29/21 : 120/82 03/18/21 : 110/69

## 2021-05-17 ENCOUNTER — Other Ambulatory Visit: Payer: Self-pay

## 2021-05-17 ENCOUNTER — Ambulatory Visit (HOSPITAL_BASED_OUTPATIENT_CLINIC_OR_DEPARTMENT_OTHER)
Admission: RE | Admit: 2021-05-17 | Discharge: 2021-05-17 | Disposition: A | Discharge status: Home / Self Care | Payer: Medicare Other | Source: Ambulatory Visit | Attending: Family | Admitting: Family

## 2021-05-17 ENCOUNTER — Encounter (HOSPITAL_BASED_OUTPATIENT_CLINIC_OR_DEPARTMENT_OTHER): Payer: Self-pay

## 2021-05-17 DIAGNOSIS — Z1231 Encounter for screening mammogram for malignant neoplasm of breast: Secondary | ICD-10-CM | POA: Diagnosis not present

## 2021-05-17 DIAGNOSIS — Z0000 Encounter for general adult medical examination without abnormal findings: Secondary | ICD-10-CM | POA: Diagnosis not present

## 2021-05-19 DIAGNOSIS — R21 Rash and other nonspecific skin eruption: Secondary | ICD-10-CM | POA: Diagnosis not present

## 2021-05-19 DIAGNOSIS — J4520 Mild intermittent asthma, uncomplicated: Secondary | ICD-10-CM | POA: Diagnosis not present

## 2021-05-19 DIAGNOSIS — H1013 Acute atopic conjunctivitis, bilateral: Secondary | ICD-10-CM | POA: Diagnosis not present

## 2021-05-19 DIAGNOSIS — J309 Allergic rhinitis, unspecified: Secondary | ICD-10-CM | POA: Diagnosis not present

## 2021-05-24 ENCOUNTER — Ambulatory Visit (INDEPENDENT_AMBULATORY_CARE_PROVIDER_SITE_OTHER): Payer: Medicare Other | Admitting: Adult Health

## 2021-05-24 ENCOUNTER — Encounter (INDEPENDENT_AMBULATORY_CARE_PROVIDER_SITE_OTHER): Payer: Self-pay | Admitting: Adult Health

## 2021-05-24 ENCOUNTER — Other Ambulatory Visit: Payer: Self-pay

## 2021-05-24 ENCOUNTER — Other Ambulatory Visit (INDEPENDENT_AMBULATORY_CARE_PROVIDER_SITE_OTHER): Payer: Self-pay | Admitting: Adult Health

## 2021-05-24 VITALS — BP 116/72 | HR 90 | Temp 98.5°F | Ht 64.0 in | Wt 251.0 lb

## 2021-05-24 DIAGNOSIS — R7303 Prediabetes: Secondary | ICD-10-CM

## 2021-05-24 DIAGNOSIS — E559 Vitamin D deficiency, unspecified: Secondary | ICD-10-CM

## 2021-05-24 DIAGNOSIS — E7849 Other hyperlipidemia: Secondary | ICD-10-CM | POA: Diagnosis not present

## 2021-05-24 DIAGNOSIS — Z6841 Body Mass Index (BMI) 40.0 and over, adult: Secondary | ICD-10-CM

## 2021-05-24 MED ORDER — ATORVASTATIN CALCIUM 20 MG PO TABS: ORAL_TABLET | ORAL | 0 refills | Status: DC

## 2021-05-24 MED ORDER — VICTOZA 18 MG/3ML ~~LOC~~ SOPN: 3.0000 mg | PEN_INJECTOR | Freq: Every day | SUBCUTANEOUS | 0 refills | Status: DC

## 2021-05-24 MED ORDER — BD PEN NEEDLE NANO 2ND GEN 32G X 4 MM MISC: 0 refills | Status: DC

## 2021-05-24 NOTE — Telephone Encounter (Signed)
Last seen [facility]

## 2021-05-24 NOTE — Progress Notes (Signed)
Chief Complaint:   OBESITY Joy [name] is here to discuss her progress with her obesity treatment plan along with follow-up of her obesity related diagnoses. Joy [name] is on the Category 2 Plan and the [place] and states she is following her eating plan approximately 100% of the time. Joy [name] states she is walking for 60 minutes 5 times per week.  Today's visit was #: 25 Starting weight: 268 lbs Starting date: 12/16/2019 Today's weight: 251 lbs Today's date: 05/24/2021 Total lbs lost to date: 17 lbs Total lbs lost since last in-office visit: 0  Interim History: Joy [name] remained on Victoza 1.8 mg daily - unable to "click up to 2.1 mg on my needle".   She reports increased polyphagia with the 1.8 mg dose.  Subjective:   1. Pre-diabetes Family history - mother and maternal aunts had/has T2D. Joy [name] remained on Victoza 1.8 mg daily - unable to "click up to 2.1 mg on my needle".   She reports increased polyphagia with the 1.8 mg dose. She denies mass in neck, dysphagia, dyspepsia, or persistent hoarseness.  2. Vitamin D deficiency She is on OTC vitamin D3 5,000 IU daily.  3. Other hyperlipidemia She is on atorvastatin 20 mg daily - denies myalgias. She takes her statin in the evening.  Assessment/Plan:   1. Pre-diabetes Check labs today. Refill Victoza 3 mg daily, as per below.  She understands to inject 2.1 mg daily.  - Refill liraglutide (VICTOZA) 18 MG/3ML SOPN; Inject 3 mg into the skin daily. Pt knows to inject 2.1mg  daily  Dispense: 15 mL; Refill: 0 - Refill Insulin Pen Needle (BD PEN NEEDLE NANO 2ND GEN) 32G X 4 MM MISC; Use 1 needle daily to inject Victoza.  Dispense: 100 each; Refill: 0 - Comprehensive metabolic panel - Hemoglobin A1c - Insulin, random  2. Vitamin D deficiency Check vitamin D level today.  - VITAMIN D 25 Hydroxy (Vit-D Deficiency, Fractures)  3. Other hyperlipidemia Refill atorvastatin 20 mg daily, as per below.  - Refill  atorvastatin (LIPITOR) 20 MG tablet; TAKE 1 TABLET(20 MG) BY MOUTH DAILY  Dispense: 30 tablet; Refill: 0  4. Obesity with current BMI of 43.2  Joy [name] is currently in the action stage of change. As such, her goal is to continue with weight loss efforts. She has agreed to the Joy [name].   Use crumbles to increase protein options.  Exercise goals:  As is.  Behavioral modification strategies: increasing lean protein intake, decreasing simple carbohydrates, meal planning and cooking strategies, keeping healthy foods in the home, and planning for success.  Joy [name] has agreed to follow-up with our clinic in 2-3 weeks. She was informed of the importance of frequent follow-up visits to maximize her success with intensive lifestyle modifications for her multiple health conditions.   Joy [name] was informed we would discuss her lab results at her next visit unless there is a critical issue that needs to be addressed sooner. Joy [name] agreed to keep her next visit at the agreed upon time to discuss these results.  Objective:   Blood pressure 116/72, pulse 90, temperature 98.5 F (36.9 C), height 5\' 4"  (1.626 m), weight 251 lb (113.9 kg), SpO2 97 %. Body mass index is 43.08 kg/m.  General: Cooperative, alert, well developed, in no acute distress. HEENT: Conjunctivae and lids unremarkable. Cardiovascular: Regular rhythm.  Lungs: Normal work of breathing. Neurologic: No focal deficits.   Lab Results  Component Value Date   CREATININE 0.93 02/07/2021   BUN 10 02/07/2021   NA  144 02/07/2021   K 3.8 02/07/2021   CL 102 02/07/2021   CO2 24 02/07/2021   Lab Results  Component Value Date   ALT 13 02/07/2021   AST 20 02/07/2021   ALKPHOS 100 02/07/2021   BILITOT 0.3 02/07/2021   Lab Results  Component Value Date   HGBA1C 5.3 02/07/2021   HGBA1C 5.3 09/28/2020   HGBA1C 5.7 (H) 04/08/2020   HGBA1C 5.6 12/16/2019   Lab Results  Component Value Date   INSULIN 7.1 02/07/2021    INSULIN 10.9 09/28/2020   INSULIN 24.0 04/08/2020   INSULIN 11.8 12/16/2019   Lab Results  Component Value Date   TSH 2.100 12/16/2019   Lab Results  Component Value Date   CHOL 153 02/07/2021   HDL 41 02/07/2021   LDLCALC 97 02/07/2021   TRIG 80 02/07/2021   CHOLHDL 3.7 02/07/2021   Lab Results  Component Value Date   VD25OH 42.1 02/07/2021   VD25OH 66.6 09/28/2020   VD25OH 26.2 (L) 04/08/2020   Lab Results  Component Value Date   WBC 6.5 12/16/2019   HGB 11.4 12/16/2019   HCT 35.2 12/16/2019   MCV 93 12/16/2019   PLT 277 12/16/2019   Lab Results  Component Value Date   IRON 46 11/25/2019   TIBC 252 09/26/2013   FERRITIN 242.6 11/25/2019   Obesity Behavioral Intervention:   Approximately 15 minutes were spent on the discussion below.  ASK: We discussed the diagnosis of obesity with Joy [name] today and Joy [name] agreed to give [name] permission to discuss obesity behavioral modification therapy today.  ASSESS: Joy [name] has the diagnosis of obesity and her BMI today is 43.2. Joy [name] is in the action stage of change.   ADVISE: Joy [name] was educated on the multiple health risks of obesity as well as the benefit of weight loss to improve her health. She was advised of the need for long term treatment and the importance of lifestyle modifications to improve her current health and to decrease her risk of future health problems.  AGREE: Multiple dietary modification options and treatment options were discussed and Joy [name] agreed to follow the recommendations documented in the above note.  ARRANGE: Joy [name] was educated on the importance of frequent visits to treat obesity as outlined per CMS and USPSTF guidelines and agreed to schedule her next follow up appointment today.  Attestation [name]:   Reviewed by clinician on day of visit: allergies, medications, problem list, medical history, surgical history, family history, social history, and previous encounter  notes.  I, Water quality scientist, CMA, am acting as Location manager for Mina Marble, NP.  I have reviewed the above documentation for accuracy and completeness, and I agree with the above. -  Rocio Roam d. Braheem Tomasik, NP-C

## 2021-05-25 LAB — COMPREHENSIVE METABOLIC PANEL
ALT: 14 IU/L (ref 0–32)
AST: 19 IU/L (ref 0–40)
Albumin/Globulin Ratio: 1.5 (ref 1.2–2.2)
Albumin: 4 g/dL (ref 3.8–4.9)
Alkaline Phosphatase: 103 IU/L (ref 44–121)
BUN/Creatinine Ratio: 7 — ABNORMAL LOW (ref 9–23)
BUN: 7 mg/dL (ref 6–24)
Bilirubin Total: 0.4 mg/dL (ref 0.0–1.2)
CO2: 25 mmol/L (ref 20–29)
Calcium: 9.2 mg/dL (ref 8.7–10.2)
Chloride: 105 mmol/L (ref 96–106)
Creatinine, Ser: 0.94 mg/dL (ref 0.57–1.00)
Globulin, Total: 2.7 g/dL (ref 1.5–4.5)
Glucose: 84 mg/dL (ref 70–99)
Potassium: 4.4 mmol/L (ref 3.5–5.2)
Sodium: 143 mmol/L (ref 134–144)
Total Protein: 6.7 g/dL (ref 6.0–8.5)
eGFR: 72 mL/min/{1.73_m2} (ref >59)

## 2021-05-25 LAB — INSULIN, RANDOM: INSULIN: 12.6 u[IU]/mL (ref 2.6–24.9)

## 2021-05-25 LAB — VITAMIN D 25 HYDROXY (VIT D DEFICIENCY, FRACTURES): Vit D, 25-Hydroxy: 58.3 ng/mL (ref 30.0–100.0)

## 2021-05-25 LAB — HEMOGLOBIN A1C
Est. average glucose Bld gHb Est-mCnc: 108 mg/dL
Hgb A1c MFr Bld: 5.4 % (ref 4.8–5.6)

## 2021-05-26 ENCOUNTER — Other Ambulatory Visit (INDEPENDENT_AMBULATORY_CARE_PROVIDER_SITE_OTHER): Payer: Self-pay | Admitting: Adult Health

## 2021-05-26 DIAGNOSIS — R7303 Prediabetes: Secondary | ICD-10-CM
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
## 2021-05-26 NOTE — Telephone Encounter (Signed)
Last seen by Katy 

## 2021-05-30 ENCOUNTER — Other Ambulatory Visit (INDEPENDENT_AMBULATORY_CARE_PROVIDER_SITE_OTHER): Payer: Self-pay

## 2021-05-30 ENCOUNTER — Other Ambulatory Visit (INDEPENDENT_AMBULATORY_CARE_PROVIDER_SITE_OTHER): Payer: Self-pay | Admitting: Adult Health

## 2021-05-30 ENCOUNTER — Encounter (INDEPENDENT_AMBULATORY_CARE_PROVIDER_SITE_OTHER): Payer: Self-pay

## 2021-05-30 DIAGNOSIS — E8881 Metabolic syndrome: Secondary | ICD-10-CM

## 2021-05-30 MED ORDER — LIRAGLUTIDE 18 MG/3ML ~~LOC~~ SOPN: 1.2000 mg | PEN_INJECTOR | Freq: Every day | SUBCUTANEOUS | 0 refills | Status: DC

## 2021-05-30 NOTE — Telephone Encounter (Signed)
Please review

## 2021-05-30 NOTE — Telephone Encounter (Signed)
Her insurance will only cover 2 pens and I believe those are 56ml per pen. The las rx sent in was for 79ml which is what I believe is causing the issue. Pt stated she called her insurance and they will only cover 2 pens per mo.

## 2021-05-30 NOTE — Telephone Encounter (Signed)
Pt's insurance has denied [facility] stated the denial reason on their end states "maximum dailey dose exceeded and the maximum dose covered is 0.3mg  qd." Pt requested to go back to last dose covered by plan.

## 2021-05-30 NOTE — Telephone Encounter (Signed)
I have resent prior authorization to patient insurance [place] and I have also done prior authorization request for Medicaid. I am currently waiting on a response. Will notify patient and provider once response is received.

## 2021-06-07 ENCOUNTER — Other Ambulatory Visit (INDEPENDENT_AMBULATORY_CARE_PROVIDER_SITE_OTHER): Payer: Self-pay | Admitting: Emergency Medicine

## 2021-06-07 DIAGNOSIS — E559 Vitamin D deficiency, unspecified: Secondary | ICD-10-CM

## 2021-06-08 ENCOUNTER — Other Ambulatory Visit: Payer: Self-pay | Admitting: Family

## 2021-06-08 DIAGNOSIS — I10 Essential (primary) hypertension: Secondary | ICD-10-CM

## 2021-06-08 MED ORDER — LISINOPRIL 5 MG PO TABS: 5.0000 mg | ORAL_TABLET | Freq: Every day | ORAL | 0 refills | Status: DC

## 2021-06-08 MED ORDER — ALBUTEROL SULFATE HFA 108 (90 BASE) MCG/ACT IN AERS: INHALATION_SPRAY | RESPIRATORY_TRACT | 1 refills | Status: DC

## 2021-06-12 ENCOUNTER — Other Ambulatory Visit (INDEPENDENT_AMBULATORY_CARE_PROVIDER_SITE_OTHER): Payer: Self-pay | Admitting: Adult Health

## 2021-06-12 DIAGNOSIS — E8881 Metabolic syndrome: Secondary | ICD-10-CM

## 2021-06-13 NOTE — Telephone Encounter (Signed)
Pt last seen by Katy Danford, FNP.  

## 2021-06-14 ENCOUNTER — Ambulatory Visit (INDEPENDENT_AMBULATORY_CARE_PROVIDER_SITE_OTHER): Payer: Medicare Other | Admitting: Family Medicine

## 2021-06-27 ENCOUNTER — Encounter (INDEPENDENT_AMBULATORY_CARE_PROVIDER_SITE_OTHER): Payer: Self-pay

## 2021-06-27 ENCOUNTER — Other Ambulatory Visit (INDEPENDENT_AMBULATORY_CARE_PROVIDER_SITE_OTHER): Payer: Self-pay | Admitting: Adult Health

## 2021-06-27 DIAGNOSIS — E7849 Other hyperlipidemia: Secondary | ICD-10-CM

## 2021-06-27 NOTE — Telephone Encounter (Signed)
Pt last seen by Katy Danford, FNP.  

## 2021-06-27 NOTE — Telephone Encounter (Signed)
Message sent to pt to see if refill needed prior to appointment tomorrow.

## 2021-06-28 ENCOUNTER — Encounter (INDEPENDENT_AMBULATORY_CARE_PROVIDER_SITE_OTHER): Payer: Self-pay | Admitting: Adult Health

## 2021-06-28 ENCOUNTER — Ambulatory Visit (INDEPENDENT_AMBULATORY_CARE_PROVIDER_SITE_OTHER): Payer: Medicare Other | Admitting: Adult Health

## 2021-06-28 ENCOUNTER — Other Ambulatory Visit: Payer: Self-pay

## 2021-06-28 VITALS — BP 105/72 | HR 82 | Temp 98.3°F | Ht 64.0 in | Wt 253.0 lb

## 2021-06-28 DIAGNOSIS — E559 Vitamin D deficiency, unspecified: Secondary | ICD-10-CM | POA: Diagnosis not present

## 2021-06-28 DIAGNOSIS — Z6841 Body Mass Index (BMI) 40.0 and over, adult: Secondary | ICD-10-CM

## 2021-06-28 DIAGNOSIS — E88819 Insulin resistance, unspecified: Secondary | ICD-10-CM

## 2021-06-28 DIAGNOSIS — E8881 Metabolic syndrome: Secondary | ICD-10-CM | POA: Diagnosis not present

## 2021-06-28 MED ORDER — LIRAGLUTIDE 18 MG/3ML ~~LOC~~ SOPN: 1.8000 mg | PEN_INJECTOR | Freq: Every day | SUBCUTANEOUS | 0 refills | Status: DC

## 2021-06-28 NOTE — Progress Notes (Signed)
Chief Complaint:   OBESITY Joy [name] is here to discuss her progress with her obesity treatment plan along with follow-up of her obesity related diagnoses. Joy [name] is on the [employer] and states she is following her eating plan approximately 100% of the time. Joy [name] states she is walking for 60 minutes 5 times per week.  Today's visit was #: 28 Starting weight: 268 lbs Starting date: 12/16/2019 Today's weight: 253 lbs Today's date: 06/28/2021 Total lbs lost to date: 15 lbs Total lbs lost since last in-office visit: 0  Interim History:  Typical day of eating of Joy [name]: Breakfast - grits with toast.   Lunch - Sandwich - 45 calorie bread with 4 ounces of [place]. Dinner - Medical sales representative or 4-6 oz of chicken (weighed with food scale).  Subjective:   1. Insulin resistance Worsening.  Discussed labs with patient today.  On 05/24/2021, BG 84, A1c 5.4, insulin 12.6. Victoza 1.2 mg QD, denies medication SE.  She is experiencing breakthrough hunger on the 1.2mg  Victoza dose. Insulin level worsening.  2. Vitamin D deficiency Discussed labs with patient today.  On 05/24/2021, vitamin D level - 58.3. She is on OTC vitamin D3 5,000 IU daily.  Assessment/Plan:   1. Insulin resistance Refill and increase Victoza to 1.8 mg daily.  - Refill and increase liraglutide (VICTOZA) 18 MG/3ML SOPN; Inject 1.8 mg into the skin daily.  Dispense: 9 mL; Refill: 0  2. Vitamin D deficiency Continue OTC vitamin D3 5/000 IU daily.  3. Obesity with current BMI of 43.5  Joy [name] is currently in the action stage of change. As such, her goal is to continue with weight loss efforts. She has agreed to the [employer].   Breakfast - 1 boiled egg with cheese and only grits or toast- not both.  Lunch - Ensure 4 ounces of [place].  Exercise goals:  As is.  Behavioral modification strategies: increasing lean protein intake, decreasing simple carbohydrates, meal planning and  cooking strategies, keeping healthy foods in the home, and planning for success.  Joy [name] has agreed to follow-up with our clinic in 4 weeks. She was informed of the importance of frequent follow-up visits to maximize her success with intensive lifestyle modifications for her multiple health conditions.   Objective:   Blood pressure 105/72, pulse 82, temperature 98.3 F (36.8 C), height 5\' 4"  (1.626 m), weight 253 lb (114.8 kg), SpO2 100 %. Body mass index is 43.43 kg/m.  General: Cooperative, alert, well developed, in no acute distress. HEENT: Conjunctivae and lids unremarkable. Cardiovascular: Regular rhythm.  Lungs: Normal work of breathing. Neurologic: No focal deficits.   Lab Results  Component Value Date   CREATININE 0.94 05/24/2021   BUN 7 05/24/2021   NA 143 05/24/2021   K 4.4 05/24/2021   CL 105 05/24/2021   CO2 25 05/24/2021   Lab Results  Component Value Date   ALT 14 05/24/2021   AST 19 05/24/2021   ALKPHOS 103 05/24/2021   BILITOT 0.4 05/24/2021   Lab Results  Component Value Date   HGBA1C 5.4 05/24/2021   HGBA1C 5.3 02/07/2021   HGBA1C 5.3 09/28/2020   HGBA1C 5.7 (H) 04/08/2020   HGBA1C 5.6 12/16/2019   Lab Results  Component Value Date   INSULIN 12.6 05/24/2021   INSULIN 7.1 02/07/2021   INSULIN 10.9 09/28/2020   INSULIN 24.0 04/08/2020   INSULIN 11.8 12/16/2019   Lab Results  Component Value Date   TSH 2.100 12/16/2019   Lab Results  Component Value Date   CHOL 153 02/07/2021   HDL 41 02/07/2021   LDLCALC 97 02/07/2021   TRIG 80 02/07/2021   CHOLHDL 3.7 02/07/2021   Lab Results  Component Value Date   VD25OH 58.3 05/24/2021   VD25OH 42.1 02/07/2021   VD25OH 66.6 09/28/2020   Lab Results  Component Value Date   WBC 6.5 12/16/2019   HGB 11.4 12/16/2019   HCT 35.2 12/16/2019   MCV 93 12/16/2019   PLT 277 12/16/2019   Lab Results  Component Value Date   IRON 46 11/25/2019   TIBC 252 09/26/2013   FERRITIN 242.6 11/25/2019    Obesity Behavioral Intervention:   Approximately 15 minutes were spent on the discussion below.  ASK: We discussed the diagnosis of obesity with Joy [name] today and Joy [name] agreed to give [name] permission to discuss obesity behavioral modification therapy today.  ASSESS: Joy [name] has the diagnosis of obesity and her BMI today is 43.5. Joy [name] is in the action stage of change.   ADVISE: Joy [name] was educated on the multiple health risks of obesity as well as the benefit of weight loss to improve her health. She was advised of the need for long term treatment and the importance of lifestyle modifications to improve her current health and to decrease her risk of future health problems.  AGREE: Multiple dietary modification options and treatment options were discussed and Joy [name] agreed to follow the recommendations documented in the above note.  ARRANGE: Joy [name] was educated on the importance of frequent visits to treat obesity as outlined per CMS and USPSTF guidelines and agreed to schedule her next follow up appointment today.  Attestation Statements:   Reviewed by clinician on day of visit: allergies, medications, problem list, medical history, surgical history, family history, social history, and previous encounter notes.  I, Water quality scientist, CMA, am acting as Location manager for Mina Marble, NP.  I have reviewed the above documentation for accuracy and completeness, and I agree with the above. -  Katy d. Danford, NP-C

## 2021-07-26 ENCOUNTER — Ambulatory Visit (INDEPENDENT_AMBULATORY_CARE_PROVIDER_SITE_OTHER): Payer: Medicare Other | Admitting: Adult Health

## 2021-07-30 ENCOUNTER — Other Ambulatory Visit (INDEPENDENT_AMBULATORY_CARE_PROVIDER_SITE_OTHER): Payer: Self-pay | Admitting: Adult Health

## 2021-07-30 DIAGNOSIS — E8881 Metabolic syndrome: Secondary | ICD-10-CM

## 2021-08-01 ENCOUNTER — Encounter (INDEPENDENT_AMBULATORY_CARE_PROVIDER_SITE_OTHER): Payer: Self-pay

## 2021-08-01 ENCOUNTER — Telehealth (INDEPENDENT_AMBULATORY_CARE_PROVIDER_SITE_OTHER): Payer: Self-pay | Admitting: Adult Health

## 2021-08-01 NOTE — Telephone Encounter (Signed)
Prior authorization is not required for Victoza. Patient sent mychart message.

## 2021-08-02 ENCOUNTER — Other Ambulatory Visit (INDEPENDENT_AMBULATORY_CARE_PROVIDER_SITE_OTHER): Payer: Self-pay | Admitting: Adult Health

## 2021-08-02 ENCOUNTER — Encounter (INDEPENDENT_AMBULATORY_CARE_PROVIDER_SITE_OTHER): Payer: Self-pay | Admitting: Adult Health

## 2021-08-02 DIAGNOSIS — E8881 Metabolic syndrome: Secondary | ICD-10-CM

## 2021-08-10 MED ORDER — LIRAGLUTIDE 18 MG/3ML ~~LOC~~ SOPN: 1.8000 mg | PEN_INJECTOR | Freq: Every day | SUBCUTANEOUS | 0 refills | Status: DC

## 2021-08-17 ENCOUNTER — Encounter (INDEPENDENT_AMBULATORY_CARE_PROVIDER_SITE_OTHER): Payer: Self-pay

## 2021-09-11 ENCOUNTER — Other Ambulatory Visit (INDEPENDENT_AMBULATORY_CARE_PROVIDER_SITE_OTHER): Payer: Self-pay | Admitting: Adult Health

## 2021-09-11 DIAGNOSIS — R7303 Prediabetes: Secondary | ICD-10-CM

## 2021-09-15 ENCOUNTER — Other Ambulatory Visit (INDEPENDENT_AMBULATORY_CARE_PROVIDER_SITE_OTHER): Payer: Self-pay | Admitting: Adult Health

## 2021-09-15 DIAGNOSIS — E8881 Metabolic syndrome: Secondary | ICD-10-CM

## 2021-09-16 ENCOUNTER — Other Ambulatory Visit: Payer: Self-pay | Admitting: Family

## 2021-09-16 ENCOUNTER — Other Ambulatory Visit (INDEPENDENT_AMBULATORY_CARE_PROVIDER_SITE_OTHER): Payer: Self-pay | Admitting: Adult Health

## 2021-09-16 DIAGNOSIS — I10 Essential (primary) hypertension: Secondary | ICD-10-CM

## 2021-09-16 DIAGNOSIS — E8881 Metabolic syndrome: Secondary | ICD-10-CM

## 2021-09-20 ENCOUNTER — Ambulatory Visit (INDEPENDENT_AMBULATORY_CARE_PROVIDER_SITE_OTHER): Payer: Medicare Other | Admitting: Family

## 2021-09-20 ENCOUNTER — Encounter: Payer: Self-pay | Admitting: Family

## 2021-09-20 VITALS — BP 129/76 | HR 71 | Temp 98.5°F | Resp 16 | Ht 64.0 in | Wt 267.8 lb

## 2021-09-20 DIAGNOSIS — Z6841 Body Mass Index (BMI) 40.0 and over, adult: Secondary | ICD-10-CM

## 2021-09-20 DIAGNOSIS — E559 Vitamin D deficiency, unspecified: Secondary | ICD-10-CM

## 2021-09-20 DIAGNOSIS — E7849 Other hyperlipidemia: Secondary | ICD-10-CM | POA: Diagnosis not present

## 2021-09-20 DIAGNOSIS — E538 Deficiency of other specified B group vitamins: Secondary | ICD-10-CM

## 2021-09-20 DIAGNOSIS — M069 Rheumatoid arthritis, unspecified: Secondary | ICD-10-CM | POA: Diagnosis not present

## 2021-09-20 DIAGNOSIS — R7303 Prediabetes: Secondary | ICD-10-CM

## 2021-09-20 DIAGNOSIS — D510 Vitamin B12 deficiency anemia due to intrinsic factor deficiency: Secondary | ICD-10-CM

## 2021-09-20 DIAGNOSIS — J4540 Moderate persistent asthma, uncomplicated: Secondary | ICD-10-CM | POA: Diagnosis not present

## 2021-09-20 DIAGNOSIS — N189 Chronic kidney disease, unspecified: Secondary | ICD-10-CM

## 2021-09-20 DIAGNOSIS — I10 Essential (primary) hypertension: Secondary | ICD-10-CM | POA: Diagnosis not present

## 2021-09-20 DIAGNOSIS — E66813 Obesity, class 3: Secondary | ICD-10-CM

## 2021-09-20 LAB — BASIC METABOLIC PANEL
BUN: 11 mg/dL (ref 6–23)
CO2: 34 mEq/L — ABNORMAL HIGH (ref 19–32)
Calcium: 9.2 mg/dL (ref 8.4–10.5)
Chloride: 104 mEq/L (ref 96–112)
Creatinine, Ser: 1.03 mg/dL (ref 0.40–1.20)
GFR: 61.16 mL/min (ref >60.00)
Glucose, Bld: 96 mg/dL (ref 70–99)
Potassium: 4.4 mEq/L (ref 3.5–5.1)
Sodium: 142 mEq/L (ref 135–145)

## 2021-09-20 LAB — HEMOGLOBIN A1C: Hgb A1c MFr Bld: 5.3 % (ref 4.6–6.5)

## 2021-09-20 MED ORDER — ATORVASTATIN CALCIUM 20 MG PO TABS: ORAL_TABLET | ORAL | 1 refills | Status: DC

## 2021-09-20 MED ORDER — ALBUTEROL SULFATE HFA 108 (90 BASE) MCG/ACT IN AERS: INHALATION_SPRAY | RESPIRATORY_TRACT | 1 refills | Status: DC

## 2021-09-20 NOTE — Assessment & Plan Note (Signed)
Stable on methotrexate/plaquenil. This is being managed by rheumatology.

## 2021-09-20 NOTE — Assessment & Plan Note (Signed)
bp stable. Continue lisinopril.
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
## 2021-09-20 NOTE — Assessment & Plan Note (Signed)
Continue folic acid.

## 2021-09-20 NOTE — Assessment & Plan Note (Signed)
Last vitamin D Lab Results  Component Value Date   VD25OH 58.3 05/24/2021   Continue daily otc vitamin D.

## 2021-09-20 NOTE — Assessment & Plan Note (Signed)
Stable on qvar/prn albuterol.

## 2021-09-20 NOTE — Assessment & Plan Note (Signed)
Last A1C was WNL.

## 2021-09-20 NOTE — Assessment & Plan Note (Signed)
Last Cr was WNL. Will recheck today.

## 2021-09-20 NOTE — Patient Instructions (Signed)
Please complete lab work prior to leaving.   

## 2021-09-20 NOTE — Assessment & Plan Note (Signed)
Wt Readings from Last 3 Encounters:  09/20/21 267 lb 12.8 oz (121.5 kg)  06/28/21 253 lb (114.8 kg)  05/24/21 251 lb (113.9 kg)   Uncontrolled. She plans to schedule a follow up visit with [facility].

## 2021-09-23 DIAGNOSIS — M0609 Rheumatoid arthritis without rheumatoid factor, multiple sites: Secondary | ICD-10-CM | POA: Diagnosis not present

## 2021-09-23 DIAGNOSIS — Z79899 Other long term (current) drug therapy: Secondary | ICD-10-CM | POA: Diagnosis not present

## 2021-09-29 ENCOUNTER — Ambulatory Visit (INDEPENDENT_AMBULATORY_CARE_PROVIDER_SITE_OTHER): Payer: Medicare Other | Admitting: Adult Health

## 2021-09-29 ENCOUNTER — Other Ambulatory Visit: Payer: Self-pay

## 2021-09-29 ENCOUNTER — Encounter (INDEPENDENT_AMBULATORY_CARE_PROVIDER_SITE_OTHER): Payer: Self-pay | Admitting: Adult Health

## 2021-09-29 VITALS — BP 126/82 | HR 79 | Temp 98.2°F | Ht 64.0 in | Wt 266.0 lb

## 2021-09-29 DIAGNOSIS — N189 Chronic kidney disease, unspecified: Secondary | ICD-10-CM

## 2021-09-29 DIAGNOSIS — E669 Obesity, unspecified: Secondary | ICD-10-CM

## 2021-09-29 DIAGNOSIS — E8881 Metabolic syndrome: Secondary | ICD-10-CM | POA: Diagnosis not present

## 2021-09-29 DIAGNOSIS — M069 Rheumatoid arthritis, unspecified: Secondary | ICD-10-CM

## 2021-09-29 DIAGNOSIS — Z6841 Body Mass Index (BMI) 40.0 and over, adult: Secondary | ICD-10-CM | POA: Diagnosis not present

## 2021-09-29 MED ORDER — LIRAGLUTIDE 18 MG/3ML ~~LOC~~ SOPN: 0.6000 mg | PEN_INJECTOR | Freq: Every day | SUBCUTANEOUS | 0 refills | Status: DC

## 2021-09-29 MED ORDER — BD PEN NEEDLE NANO 2ND GEN 32G X 4 MM MISC: 0 refills | Status: DC

## 2021-09-29 NOTE — Progress Notes (Signed)
Chief Complaint:   OBESITY Joy [name] is here to discuss her progress with her obesity treatment plan along with follow-up of her obesity related diagnoses. Joy [name] is on the [employer] and states she is following her eating plan approximately 50% of the time. Joy [name] states she is not exercising regularly at this time.  Today's visit was #: 58 Starting weight: 268 lbs Starting date: 12/16/2019 Today's weight: 266 lbs Today's date: 09/29/2021 Total lbs lost to date: 2 lbs Total lbs lost since last in-office visit: 0  Interim History:  On 09/20/2021, PCP office visit - prescription discontinued - hydrocodone/acetaminophen, methocarbamol, ondansetron. PCP ran CMP/A1c - discussed results with pt.  Of note:  Last office visit at [facility] - 06/28/2021.  Subjective:   1. Chronic kidney disease, unspecified CKD stage On 09/20/2021, CMP - creatinine 1.03, GFR 64, electrolytes stable.  2. Insulin resistance On 09/20/2021, A1c 5.3. Last dose of Victoza 1.8 mg >5 weeks. When on the GLP-1, so tolerated well.  3. Rheumatoid arthritis, involving unspecified site, unspecified whether rheumatoid factor present ([place]) Due to elevated LFTs, her rheumatologist discontinued folic acid and she denies RUQ pain. On 09/23/2021, alk phos 111, AST 52, ALT 46.  Assessment/Plan:   1. Chronic kidney disease, unspecified CKD stage Continue to avoid NSAIDs.  2. Insulin resistance Refill pen needles. Refill Victoza 0.6 mg daily.  Take 0.6 mg daiy for 2 weeks, then 1.2 mg daily the 3rd week.  - Refill liraglutide (VICTOZA) 18 MG/3ML SOPN; Inject 0.6 mg into the skin daily.  Dispense: 9 mL; Refill: 0 - Refill Insulin Pen Needle (BD PEN NEEDLE NANO 2ND GEN) 32G X 4 MM MISC; Use 1 needle daily to inject Victoza.  Dispense: 100 each; Refill: 0  3. Rheumatoid arthritis, involving unspecified site, unspecified whether rheumatoid factor present ([place]) Avoid hepatotoxic substances.  4. Obesity with current  BMI of 45.7  Joy [name] is currently in the action stage of change. As such, her goal is to continue with weight loss efforts. She has agreed to the Category 1 Plan.   Exercise goals: No exercise has been prescribed at this time.  Behavioral modification strategies: increasing lean protein intake, decreasing simple carbohydrates, meal planning and cooking strategies, keeping healthy foods in the home, better snacking choices, and planning for success.  Joy [name] has agreed to follow-up with our clinic in 3 weeks. She was informed of the importance of frequent follow-up visits to maximize her success with intensive lifestyle modifications for her multiple health conditions.   Objective:   Blood pressure 126/82, pulse 79, temperature 98.2 F (36.8 C), height '5\' 4"'  (1.626 m), weight 266 lb (120.7 kg), SpO2 100 %. Body mass index is 45.66 kg/m.  General: Cooperative, alert, well developed, in no acute distress. HEENT: Conjunctivae and lids unremarkable. Cardiovascular: Regular rhythm.  Lungs: Normal work of breathing. Neurologic: No focal deficits.   Lab Results  Component Value Date   CREATININE 1.03 09/20/2021   BUN 11 09/20/2021   NA 142 09/20/2021   K 4.4 09/20/2021   CL 104 09/20/2021   CO2 34 (H) 09/20/2021   Lab Results  Component Value Date   ALT 14 05/24/2021   AST 19 05/24/2021   ALKPHOS 103 05/24/2021   BILITOT 0.4 05/24/2021   Lab Results  Component Value Date   HGBA1C 5.3 09/20/2021   HGBA1C 5.4 05/24/2021   HGBA1C 5.3 02/07/2021   HGBA1C 5.3 09/28/2020   HGBA1C 5.7 (H) 04/08/2020   Lab Results  Component Value Date  INSULIN 12.6 05/24/2021   INSULIN 7.1 02/07/2021   INSULIN 10.9 09/28/2020   INSULIN 24.0 04/08/2020   INSULIN 11.8 12/16/2019   Lab Results  Component Value Date   TSH 2.100 12/16/2019   Lab Results  Component Value Date   CHOL 153 02/07/2021   HDL 41 02/07/2021   LDLCALC 97 02/07/2021   TRIG 80 02/07/2021   CHOLHDL 3.7  02/07/2021   Lab Results  Component Value Date   VD25OH 58.3 05/24/2021   VD25OH 42.1 02/07/2021   VD25OH 66.6 09/28/2020   Lab Results  Component Value Date   WBC 6.5 12/16/2019   HGB 11.4 12/16/2019   HCT 35.2 12/16/2019   MCV 93 12/16/2019   PLT 277 12/16/2019   Lab Results  Component Value Date   IRON 46 11/25/2019   TIBC 252 09/26/2013   FERRITIN 242.6 11/25/2019   Obesity Behavioral Intervention:   Approximately 15 minutes were spent on the discussion below.  ASK: We discussed the diagnosis of obesity with Joy [name] today and Joy [name] agreed to give [name] permission to discuss obesity behavioral modification therapy today.  ASSESS: Joy [name] has the diagnosis of obesity and her BMI today is 45.7. Joy [name] is in the action stage of change.   ADVISE: [name] was educated on the multiple health risks of obesity as well as the benefit of weight loss to improve her health. She was advised of the need for long term treatment and the importance of lifestyle modifications to improve her current health and to decrease her risk of future health problems.  AGREE: Multiple dietary modification options and treatment options were discussed and Joy [name] agreed to follow the recommendations documented in the above note.  ARRANGE: Joy [name] was educated on the importance of frequent visits to treat obesity as outlined per CMS and USPSTF guidelines and agreed to schedule her next follow up appointment today.  Attestation Statements:   Reviewed by clinician on day of visit: allergies, medications, problem list, medical history, surgical history, family history, social history, and previous encounter notes.  I, Water quality scientist, CMA, am acting as Location manager for Joy Marble, NP.  I have reviewed the above documentation for accuracy and completeness, and I agree with the above. - Joy [name] d. Joy Kassel, NP-C

## 2021-10-25 ENCOUNTER — Ambulatory Visit (INDEPENDENT_AMBULATORY_CARE_PROVIDER_SITE_OTHER): Payer: Medicare Other | Admitting: Adult Health

## 2021-10-25 ENCOUNTER — Other Ambulatory Visit: Payer: Self-pay

## 2021-10-25 ENCOUNTER — Encounter (INDEPENDENT_AMBULATORY_CARE_PROVIDER_SITE_OTHER): Payer: Self-pay | Admitting: Adult Health

## 2021-10-25 VITALS — BP 116/76 | HR 88 | Temp 98.0°F | Ht 64.0 in | Wt 264.0 lb

## 2021-10-25 DIAGNOSIS — E8881 Metabolic syndrome: Secondary | ICD-10-CM | POA: Diagnosis not present

## 2021-10-25 DIAGNOSIS — Z6841 Body Mass Index (BMI) 40.0 and over, adult: Secondary | ICD-10-CM

## 2021-10-25 DIAGNOSIS — E669 Obesity, unspecified: Secondary | ICD-10-CM

## 2021-10-25 DIAGNOSIS — M069 Rheumatoid arthritis, unspecified: Secondary | ICD-10-CM

## 2021-10-25 MED ORDER — LIRAGLUTIDE 18 MG/3ML ~~LOC~~ SOPN: 1.8000 mg | PEN_INJECTOR | Freq: Every day | SUBCUTANEOUS | 0 refills | Status: DC

## 2021-10-25 NOTE — Progress Notes (Signed)
? ? ? ?Chief Complaint:  ? ?OBESITY ?Joy [name] is here to discuss her progress with her obesity treatment plan along with follow-up of her obesity related diagnoses. Joy [name] is on the Category 1 Plan and states she is following her eating plan approximately 100% of the time. Joy [name] states she is doing chair exercises for 30 minutes 5 times per week. ? ?Today's visit was #: 30 ?Starting weight: 268 lbs ?Starting date: 12/16/2019 ?Today's weight: 264 lbs ?Today's date: 10/25/2021 ?Total lbs lost to date: 4 lbs ?Total lbs lost since last in-office visit: 2 lbs ? ?Interim History:  ?Joy [name] was restarted on Victoza 0.6 mg on 09/29/2021. ?She has titrated up to Victoza 1.2 mg- tolerating well. ? ?Methotrexate discontinued by Rheumatology due to elevated LFTs. ?To help manage pain-she has been performing chair exercises.  ? ?Subjective:  ? ?1. Insulin resistance ?Joy [name] was restarted on Victoza 0.6 mg on 09/29/2021. ?She has titrated up to Victoza 1.2 mg- she denies mass in neck, dysphagia, dyspepsia, persistent hoarseness, abd pain, or N/V/Constipation. ? ?2. Rheumatoid arthritis, involving unspecified site, unspecified whether rheumatoid factor present (Joy [name]) ?Methotrexate 2.68m- 7.565monce week and folic acid QD were both discontinued by Rheumatology due to elevated LFTs. ?On 09/23/21, CMP: Creat 1.04, GFR 64 Alk Phos 111, AST 52, ALT 46 ?On 09/23/21 sed rate: 77, CRP (no cardiac) 26.6. ?She denies known family history of autoimmune disorders. ? ?Assessment/Plan:  ? ?1. Insulin resistance ?Refill and increase Victoza 1.8 mg once daily. ? ?- Increase liraglutide (VICTOZA) 18 MG/3ML SOPN; Inject 1.8 mg into the skin daily.  Dispense: 9 mL; Refill: 0 ? ?2. Rheumatoid arthritis, involving unspecified site, unspecified whether rheumatoid factor present ([name]?Follow-up with Rheumatology - April 2023. ? ?3. Obesity with current BMI of 45.3 ? ?Joy [name] currently in the action stage of change. As such, her goal is to  continue with weight loss efforts. She has agreed to the Category 1 Plan.  ? ?Exercise goals:  As is. ? ?Behavioral modification strategies: increasing lean protein intake, decreasing simple carbohydrates, meal planning and cooking strategies, keeping healthy foods in the home, and planning for success. ? ?Joy [name] agreed to follow-up with our clinic in 3-4 weeks. She was informed of the importance of frequent follow-up visits to maximize her success with intensive lifestyle modifications for her multiple health conditions.  ? ?Objective:  ? ?Blood pressure 116/76, pulse 88, temperature 98 ?F (36.7 ?C), height '5\' 4"'  (1.626 m), weight 264 lb (119.7 kg), SpO2 97 %. ?Body mass index is 45.32 kg/m?. ? ?General: Cooperative, alert, well developed, in no acute distress. ?HEENT: Conjunctivae and lids unremarkable. ?Cardiovascular: Regular rhythm.  ?Lungs: Normal work of breathing. ?Neurologic: No focal deficits.  ? ?Lab Results  ?Component Value Date  ? CREATININE 1.03 09/20/2021  ? BUN 11 09/20/2021  ? NA 142 09/20/2021  ? K 4.4 09/20/2021  ? CL 104 09/20/2021  ? CO2 34 (H) 09/20/2021  ? ?Lab Results  ?Component Value Date  ? ALT 14 05/24/2021  ? AST 19 05/24/2021  ? ALKPHOS 103 05/24/2021  ? BILITOT 0.4 05/24/2021  ? ?Lab Results  ?Component Value Date  ? HGBA1C 5.3 09/20/2021  ? HGBA1C 5.4 05/24/2021  ? HGBA1C 5.3 02/07/2021  ? HGBA1C 5.3 09/28/2020  ? HGBA1C 5.7 (H) 04/08/2020  ? ?Lab Results  ?Component Value Date  ? INSULIN 12.6 05/24/2021  ? INSULIN 7.1 02/07/2021  ? INSULIN 10.9 09/28/2020  ? INSULIN 24.0 04/08/2020  ? INSULIN 11.8 12/16/2019  ? ?Lab Results  ?  Component Value Date  ? TSH 2.100 12/16/2019  ? ?Lab Results  ?Component Value Date  ? CHOL 153 02/07/2021  ? HDL 41 02/07/2021  ? I[place] 97 02/07/2021  ? TRIG 80 02/07/2021  ? CHOLHDL 3.7 02/07/2021  ? ?Lab Results  ?Component Value Date  ? VD25OH 58.3 05/24/2021  ? VD25OH 42.1 02/07/2021  ? VD25OH 66.6 09/28/2020  ? ?Lab Results  ?Component Value Date   ? WBC 6.5 12/16/2019  ? HGB 11.4 12/16/2019  ? HCT 35.2 12/16/2019  ? MCV 93 12/16/2019  ? PLT 277 12/16/2019  ? ?Lab Results  ?Component Value Date  ? IRON 46 11/25/2019  ? TIBC 252 09/26/2013  ? FERRITIN 242.6 11/25/2019  ? ?Obesity Behavioral Intervention:  ? ?Approximately 15 minutes were spent on the discussion below. ? ?ASK: ?We discussed the diagnosis of obesity with Joy [name] today and Joy [name] agreed to give [name] permission to discuss obesity behavioral modification therapy today. ? ?ASSESS: ?Joy [name] has the diagnosis of obesity and her BMI today is 45.3. Joy [name] is in the action stage of change.  ? ?ADVISE: ?Joy [name] was educated on the multiple health risks of obesity as well as the benefit of weight loss to improve her health. She was advised of the need for long term treatment and the importance of lifestyle modifications to improve her current health and to decrease her risk of future health problems. ? ?AGREE: ?Multiple dietary modification options and treatment options were discussed and Joy [name] agreed to follow the recommendations documented in the above note. ? ?ARRANGE: ?Joy [name] was educated on the importance of frequent visits to treat obesity as outlined per CMS and USPSTF guidelines and agreed to schedule her next follow up appointment today. ? ?Attestation Statements:  ? ?Reviewed by clinician on day of visit: allergies, medications, problem list, medical history, surgical history, family history, social history, and previous encounter notes. ? ?I, Water quality scientist, CMA, am acting as Location manager for Mina Marble, NP. ? ?I have reviewed the above documentation for accuracy and completeness, and I agree with the above. -  Raahi Korber d. Claryce Friel, NP-C ?

## 2021-11-10 ENCOUNTER — Ambulatory Visit (INDEPENDENT_AMBULATORY_CARE_PROVIDER_SITE_OTHER): Payer: Medicare Other | Admitting: Family

## 2021-11-10 ENCOUNTER — Encounter: Payer: Self-pay | Admitting: Family

## 2021-11-10 VITALS — BP 130/70 | HR 89 | Temp 97.9°F | Ht 64.0 in | Wt 270.2 lb

## 2021-11-10 DIAGNOSIS — R300 Dysuria: Secondary | ICD-10-CM

## 2021-11-10 LAB — POCT URINALYSIS DIP (MANUAL ENTRY)
Bilirubin, UA: NEGATIVE
Glucose, UA: NEGATIVE mg/dL
Ketones, POC UA: NEGATIVE mg/dL
Nitrite, UA: NEGATIVE
Protein Ur, POC: NEGATIVE mg/dL
Spec Grav, UA: <=1.005 — AB (ref 1.010–1.025)
Urobilinogen, UA: 0.2 E.U./dL
pH, UA: 6 (ref 5.0–8.0)

## 2021-11-10 MED ORDER — SULFAMETHOXAZOLE-TRIMETHOPRIM 800-160 MG PO TABS: 1.0000 | ORAL_TABLET | Freq: Two times a day (BID) | ORAL | 0 refills | Status: DC
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
## 2021-11-13 LAB — URINE CULTURE
MICRO NUMBER:: 13201810
SPECIMEN QUALITY:: ADEQUATE

## 2021-11-17 ENCOUNTER — Encounter (INDEPENDENT_AMBULATORY_CARE_PROVIDER_SITE_OTHER): Payer: Self-pay | Admitting: Adult Health

## 2021-11-17 ENCOUNTER — Ambulatory Visit (INDEPENDENT_AMBULATORY_CARE_PROVIDER_SITE_OTHER): Payer: Medicare Other | Admitting: Adult Health

## 2021-11-17 VITALS — BP 106/72 | HR 99 | Temp 98.2°F | Ht 64.0 in | Wt 259.0 lb

## 2021-11-17 DIAGNOSIS — Z6841 Body Mass Index (BMI) 40.0 and over, adult: Secondary | ICD-10-CM

## 2021-11-17 DIAGNOSIS — M159 Polyosteoarthritis, unspecified: Secondary | ICD-10-CM | POA: Diagnosis not present

## 2021-11-17 DIAGNOSIS — M0609 Rheumatoid arthritis without rheumatoid factor, multiple sites: Secondary | ICD-10-CM | POA: Diagnosis not present

## 2021-11-17 DIAGNOSIS — E559 Vitamin D deficiency, unspecified: Secondary | ICD-10-CM | POA: Diagnosis not present

## 2021-11-17 DIAGNOSIS — E8881 Metabolic syndrome: Secondary | ICD-10-CM | POA: Diagnosis not present

## 2021-11-17 DIAGNOSIS — Z79899 Other long term (current) drug therapy: Secondary | ICD-10-CM | POA: Diagnosis not present

## 2021-11-17 DIAGNOSIS — E669 Obesity, unspecified: Secondary | ICD-10-CM | POA: Diagnosis not present

## 2021-11-17 MED ORDER — LIRAGLUTIDE 18 MG/3ML ~~LOC~~ SOPN: 1.8000 mg | PEN_INJECTOR | Freq: Every day | SUBCUTANEOUS | 0 refills | Status: DC

## 2021-11-23 NOTE — Progress Notes (Signed)
? ? ? ?Chief Complaint:  ? ?OBESITY ?Joy [name] is here to discuss her progress with her obesity treatment plan along with follow-up of her obesity related diagnoses. Joy [name] is on the Category 1 Plan and states she is following her eating plan approximately 100% of the time. Joy [name] states she is doing chair exercises for 30 minutes 5-6 times per week. ? ?Today's visit was #: 31 ?Starting weight: 268 lbs ?Starting date: 12/16/2019 ?Today's weight: 259 lbs ?Today's date: 11/17/2021 ?Total lbs lost to date: 9 lbs ?Total lbs lost since last in-office visit: 5 lbs ? ?Interim History:  ?Joy [name] restarted Victoza on 09/29/2021. ?Victoza was increased to 1.8 mg daily on 10/25/2021. ?She has an appointment with Rheumatology today/Dr.Ziolkowska. ? ?She is drinking 4 L of Life Water/day.  ? ?Subjective:  ? ?1. Vitamin D deficiency ?On 05/24/2021, vitamin D level - 58.3 - stable. ?She is on OTC vitamin D3 5000 IU daily. ? ?2. Insulin resistance ?On 09/20/2021, A1c 5.3 - at goal. ?On 05/24/2022, insulin level 12.6 - above goal. ?Joy [name] restarted Victoza on 09/29/2021. ?Victoza was increased to 1.8 mg daily on 10/25/2021. ?She denies GLP-1 medication SE. ? ?Assessment/Plan:  ? ?1. Vitamin D deficiency ?Continue OTC vitamin D3 supplement. ? ?2. Insulin resistance ?Joy Victoza 1.8 mg daily. ? ?- Joy liraglutide (VICTOZA) 18 MG/3ML SOPN; Inject 1.8 mg into the skin daily.  Dispense: 9 mL; Joy: 0 ? ?3. Obesity with current BMI of 44.5 ? ?Joy [name] is currently in the action stage of change. As such, her goal is to continue with weight loss efforts. She has agreed to the Category 1 Plan.  ? ?Check fasting labs at next office visit. ? ?Exercise goals:  As is. ? ?Behavioral modification strategies: increasing lean protein intake, decreasing simple carbohydrates, meal planning and cooking strategies, keeping healthy foods in the home, and planning for success. ? ?Joy [name] has agreed to follow-up with our clinic in 3-4 weeks.  She was informed of the importance of frequent follow-up visits to maximize her success with intensive lifestyle modifications for her multiple health conditions.  ? ?Objective:  ? ?Blood pressure 106/72, pulse 99, temperature 98.2 ?F (36.8 ?C), height '5\' 4"'$  (1.626 m), weight 259 lb (117.5 kg), SpO2 98 %. ?Body mass index is 44.46 kg/m?. ? ?General: Cooperative, alert, well developed, in no acute distress. ?HEENT: Conjunctivae and lids unremarkable. ?Cardiovascular: Regular rhythm.  ?Lungs: Normal work of breathing. ?Neurologic: No focal deficits.  ? ?Lab Results  ?Component Value Date  ? CREATININE 1.03 09/20/2021  ? BUN 11 09/20/2021  ? NA 142 09/20/2021  ? K 4.4 09/20/2021  ? CL 104 09/20/2021  ? CO2 34 (H) 09/20/2021  ? ?Lab Results  ?Component Value Date  ? ALT 14 05/24/2021  ? AST 19 05/24/2021  ? ALKPHOS 103 05/24/2021  ? BILITOT 0.4 05/24/2021  ? ?Lab Results  ?Component Value Date  ? HGBA1C 5.3 09/20/2021  ? HGBA1C 5.4 05/24/2021  ? HGBA1C 5.3 02/07/2021  ? HGBA1C 5.3 09/28/2020  ? HGBA1C 5.7 (H) 04/08/2020  ? ?Lab Results  ?Component Value Date  ? INSULIN 12.6 05/24/2021  ? INSULIN 7.1 02/07/2021  ? INSULIN 10.9 09/28/2020  ? INSULIN 24.0 04/08/2020  ? INSULIN 11.8 12/16/2019  ? ?Lab Results  ?Component Value Date  ? TSH 2.100 12/16/2019  ? ?Lab Results  ?Component Value Date  ? CHOL 153 02/07/2021  ? HDL 41 02/07/2021  ? [place] 97 02/07/2021  ? TRIG 80 02/07/2021  ? CHOLHDL 3.7 02/07/2021  ? ?Lab Results  ?  Component Value Date  ? VD25OH 58.3 05/24/2021  ? VD25OH 42.1 02/07/2021  ? VD25OH 66.6 09/28/2020  ? ?Lab Results  ?Component Value Date  ? WBC 6.5 12/16/2019  ? HGB 11.4 12/16/2019  ? HCT 35.2 12/16/2019  ? MCV 93 12/16/2019  ? PLT 277 12/16/2019  ? ?Lab Results  ?Component Value Date  ? IRON 46 11/25/2019  ? TIBC 252 09/26/2013  ? FERRITIN 242.6 11/25/2019  ? ?Obesity Behavioral Intervention:  ? ?Approximately 15 minutes were spent on the discussion below. ? ?ASK: ?We discussed the diagnosis of  obesity with Adhira today and Tausha agreed to give [name] permission to discuss obesity behavioral modification therapy today. ? ?ASSESS: ?Shantoya has the diagnosis of obesity and her BMI today is 44.5. Renaye is in the action stage of change.  ? ?ADVISE: ?Lillianne was educated on the multiple health risks of obesity as well as the benefit of weight loss to improve her health. She was advised of the need for long term treatment and the importance of lifestyle modifications to improve her current health and to decrease her risk of future health problems. ? ?AGREE: ?Multiple dietary modification options and treatment options were discussed and Debi agreed to follow the recommendations documented in the above note. ? ?ARRANGE: ?Velicia was educated on the importance of frequent visits to treat obesity as outlined per CMS and USPSTF guidelines and agreed to schedule her next follow up appointment today. ? ?Attestation Statements:  ? ?Reviewed by clinician on day of visit: allergies, medications, problem list, medical history, surgical history, family history, social history, and previous encounter notes. ? ?I, Water quality scientist, CMA, am acting as Location manager for Mina Marble, NP. ? ?I have reviewed the above documentation for accuracy and completeness, and I agree with the above. -  Kerissa Coia d. Alexyia Guarino, NP-C ?

## 2021-11-28 ENCOUNTER — Other Ambulatory Visit: Payer: Self-pay | Admitting: Family

## 2021-11-28 DIAGNOSIS — I10 Essential (primary) hypertension: Secondary | ICD-10-CM

## 2021-12-20 ENCOUNTER — Telehealth (INDEPENDENT_AMBULATORY_CARE_PROVIDER_SITE_OTHER): Payer: Medicare Other | Admitting: Family Medicine

## 2021-12-21 ENCOUNTER — Other Ambulatory Visit (INDEPENDENT_AMBULATORY_CARE_PROVIDER_SITE_OTHER): Payer: Self-pay | Admitting: Adult Health

## 2021-12-21 DIAGNOSIS — E8881 Metabolic syndrome: Secondary | ICD-10-CM

## 2021-12-26 ENCOUNTER — Other Ambulatory Visit (INDEPENDENT_AMBULATORY_CARE_PROVIDER_SITE_OTHER): Payer: Self-pay | Admitting: Adult Health

## 2021-12-26 DIAGNOSIS — E8881 Metabolic syndrome: Secondary | ICD-10-CM

## 2021-12-31 ENCOUNTER — Other Ambulatory Visit: Payer: Self-pay | Admitting: Family

## 2022-01-11 ENCOUNTER — Encounter (INDEPENDENT_AMBULATORY_CARE_PROVIDER_SITE_OTHER): Payer: Self-pay | Admitting: Family Medicine

## 2022-01-11 ENCOUNTER — Ambulatory Visit (INDEPENDENT_AMBULATORY_CARE_PROVIDER_SITE_OTHER): Payer: Medicare Other | Admitting: Family Medicine

## 2022-01-11 VITALS — BP 113/78 | HR 85 | Temp 98.0°F | Ht 64.0 in | Wt 263.0 lb

## 2022-01-11 DIAGNOSIS — Z6841 Body Mass Index (BMI) 40.0 and over, adult: Secondary | ICD-10-CM

## 2022-01-11 DIAGNOSIS — E7849 Other hyperlipidemia: Secondary | ICD-10-CM | POA: Diagnosis not present

## 2022-01-11 DIAGNOSIS — Z7985 Long-term (current) use of injectable non-insulin antidiabetic drugs: Secondary | ICD-10-CM | POA: Diagnosis not present

## 2022-01-11 DIAGNOSIS — E559 Vitamin D deficiency, unspecified: Secondary | ICD-10-CM

## 2022-01-11 DIAGNOSIS — E8881 Metabolic syndrome: Secondary | ICD-10-CM | POA: Diagnosis not present

## 2022-01-11 DIAGNOSIS — N189 Chronic kidney disease, unspecified: Secondary | ICD-10-CM

## 2022-01-11 DIAGNOSIS — E669 Obesity, unspecified: Secondary | ICD-10-CM

## 2022-01-11 MED ORDER — LIRAGLUTIDE 18 MG/3ML ~~LOC~~ SOPN: 1.8000 mg | PEN_INJECTOR | Freq: Every day | SUBCUTANEOUS | 0 refills | Status: DC

## 2022-01-11 MED ORDER — BD PEN NEEDLE NANO 2ND GEN 32G X 4 MM MISC: 0 refills | Status: DC

## 2022-01-12 LAB — COMPREHENSIVE METABOLIC PANEL
ALT: 8 IU/L (ref 0–32)
AST: 19 IU/L (ref 0–40)
Albumin/Globulin Ratio: 1.5 (ref 1.2–2.2)
Albumin: 4 g/dL (ref 3.8–4.9)
Alkaline Phosphatase: 100 IU/L (ref 44–121)
BUN/Creatinine Ratio: 10 (ref 9–23)
BUN: 11 mg/dL (ref 6–24)
Bilirubin Total: 0.4 mg/dL (ref 0.0–1.2)
CO2: 26 mmol/L (ref 20–29)
Calcium: 9 mg/dL (ref 8.7–10.2)
Chloride: 105 mmol/L (ref 96–106)
Creatinine, Ser: 1.12 mg/dL — ABNORMAL HIGH (ref 0.57–1.00)
Globulin, Total: 2.7 g/dL (ref 1.5–4.5)
Glucose: 86 mg/dL (ref 70–99)
Potassium: 4.1 mmol/L (ref 3.5–5.2)
Sodium: 143 mmol/L (ref 134–144)
Total Protein: 6.7 g/dL (ref 6.0–8.5)
eGFR: 58 mL/min/{1.73_m2} — ABNORMAL LOW (ref >59)

## 2022-01-12 LAB — LIPID PANEL WITH LDL/HDL RATIO
Cholesterol, Total: 177 mg/dL (ref 100–199)
HDL: 45 mg/dL (ref >39)
LDL Chol Calc (NIH): 118 mg/dL — ABNORMAL HIGH (ref 0–99)
LDL/HDL Ratio: 2.6 ratio (ref 0.0–3.2)
Triglycerides: 73 mg/dL (ref 0–149)
VLDL Cholesterol Cal: 14 mg/dL (ref 5–40)

## 2022-01-12 LAB — HEMOGLOBIN A1C
Est. average glucose Bld gHb Est-mCnc: 100 mg/dL
Hgb A1c MFr Bld: 5.1 % (ref 4.8–5.6)

## 2022-01-12 LAB — INSULIN, RANDOM: INSULIN: 14.1 u[IU]/mL (ref 2.6–24.9)

## 2022-01-12 LAB — VITAMIN D 25 HYDROXY (VIT D DEFICIENCY, FRACTURES): Vit D, 25-Hydroxy: 50.4 ng/mL (ref 30.0–100.0)

## 2022-01-17 NOTE — Progress Notes (Signed)
Chief Complaint:   OBESITY Joy [name] is here to discuss her progress with her obesity treatment plan along with follow-up of her obesity related diagnoses. Joy [name] is on the Category 1 Plan and states she is following her eating plan approximately 100% of the time. Joy [name] states she is walking and doing chair exercises 30-40 minutes 3-5 times per week.  Today's visit was #: 44 Starting weight: 268 lbs Starting date: 12/16/2019 Today's weight: 263 lbs Today's date: 01/11/2022 Total lbs lost to date: 5 lbs Total lbs lost since last in-office visit: 0  Interim History: Joy [name] feels she is starting to get used to Victoza and is starting to get hungry. She voices she has mostly been living her normal life. She thinks she is going to the beach in July.  Subjective:   1. Vitamin D deficiency Gladyse is currently taking Vit D 50,000 IU/day. Her Vit D level of 58.3 in Oct 2022. Denies any nausea, vomiting or muscle weakness.  2. Other hyperlipidemia Joy [name] is currently on Lipitor 20 mg. Her last LFT's and FLP mostly in range.  3. Insulin resistance Joy [name]'s last A1c WNL ( within normal limits) at 5.3. Insulin at 12.6 in Oct 2022.  4. Chronic kidney disease, unspecified CKD stage Joy [name]'s last Cr (creatinine) WNL. In past Cr has fluctuated between 0.9-1.1.  Assessment/Plan:   1. Vitamin D deficiency We will obtain Vit d level today.  - VITAMIN D 25 Hydroxy (Vit-D Deficiency, Fractures)  2. Other hyperlipidemia We will obtain FLP today.  - Lipid Panel With LDL/HDL Ratio  3. Insulin resistance We will refill Victoza 1.8 mg subcutaneous daily for 1 month with 0 refills and pen needles.   -Refill  liraglutide (VICTOZA) 18 MG/3ML SOPN; Inject 1.8 mg into the skin daily.  Dispense: 9 mL; Refill: 0  -Refill  Insulin Pen Needle (BD PEN NEEDLE NANO 2ND GEN) 32G X 4 MM MISC; Use 1 needle daily to inject Victoza.  Dispense: 100 each; Refill: 0  Will obtain A1c and  insulin level today.  - Hemoglobin A1c - Insulin, random  4. Chronic kidney disease, unspecified CKD stage We will obtain CMP today.  - Comprehensive metabolic panel  5. Obesity with current BMI of 45.2 Joy [name] is currently in the action stage of change. As such, her goal is to continue with weight loss efforts. She has agreed to the Category 2 Plan.   Exercise goals: As is   Behavioral modification strategies: increasing lean protein intake, meal planning and cooking strategies, keeping healthy foods in the home, and planning for success.  Joy [name] has agreed to follow-up with our clinic in 3 weeks. She was informed of the importance of frequent follow-up visits to maximize her success with intensive lifestyle modifications for her multiple health conditions.   Joy [name] was informed we would discuss her lab results at her next visit unless there is a critical issue that needs to be addressed sooner. Joy [name] agreed to keep her next visit at the agreed upon time to discuss these results.  Objective:   Blood pressure 113/78, pulse 85, temperature 98 F (36.7 C), height '5\' 4"'$  (1.626 m), weight 263 lb (119.3 kg), SpO2 99 %. Body mass index is 45.14 kg/m.  General: Cooperative, alert, well developed, in no acute distress. HEENT: Conjunctivae and lids unremarkable. Cardiovascular: Regular rhythm.  Lungs: Normal work of breathing. Neurologic: No focal deficits.   Lab Results  Component Value Date   CREATININE 1.12 (H) 01/11/2022   BUN 11 01/11/2022  NA 143 01/11/2022   K 4.1 01/11/2022   CL 105 01/11/2022   CO2 26 01/11/2022   Lab Results  Component Value Date   ALT 8 01/11/2022   AST 19 01/11/2022   ALKPHOS 100 01/11/2022   BILITOT 0.4 01/11/2022   Lab Results  Component Value Date   HGBA1C 5.1 01/11/2022   HGBA1C 5.3 09/20/2021   HGBA1C 5.4 05/24/2021   HGBA1C 5.3 02/07/2021   HGBA1C 5.3 09/28/2020   Lab Results  Component Value Date   INSULIN 14.1  01/11/2022   INSULIN 12.6 05/24/2021   INSULIN 7.1 02/07/2021   INSULIN 10.9 09/28/2020   INSULIN 24.0 04/08/2020   Lab Results  Component Value Date   TSH 2.100 12/16/2019   Lab Results  Component Value Date   CHOL 177 01/11/2022   HDL 45 01/11/2022   LDLCALC 118 (H) 01/11/2022   TRIG 73 01/11/2022   CHOLHDL 3.7 02/07/2021   Lab Results  Component Value Date   VD25OH 50.4 01/11/2022   VD25OH 58.3 05/24/2021   VD25OH 42.1 02/07/2021   Lab Results  Component Value Date   WBC 6.5 12/16/2019   HGB 11.4 12/16/2019   HCT 35.2 12/16/2019   MCV 93 12/16/2019   PLT 277 12/16/2019   Lab Results  Component Value Date   IRON 46 11/25/2019   TIBC 252 09/26/2013   FERRITIN 242.6 11/25/2019    Attestation Statements:   Reviewed by clinician on day of visit: allergies, medications, problem list, medical history, surgical history, family history, social history, and previous encounter notes.  I, Elnora Morrison, RMA am acting as transcriptionist for Coralie Common, MD.  I have reviewed the above documentation for accuracy and completeness, and I agree with the above. - Coralie Common, MD

## 2022-02-01 DIAGNOSIS — E119 Type 2 diabetes mellitus without complications: Secondary | ICD-10-CM | POA: Diagnosis not present

## 2022-02-07 ENCOUNTER — Encounter (INDEPENDENT_AMBULATORY_CARE_PROVIDER_SITE_OTHER): Payer: Self-pay | Admitting: Family Medicine

## 2022-02-07 ENCOUNTER — Ambulatory Visit (INDEPENDENT_AMBULATORY_CARE_PROVIDER_SITE_OTHER): Payer: Medicare Other | Admitting: Family Medicine

## 2022-02-07 VITALS — BP 111/74 | HR 81 | Temp 98.1°F | Ht 64.0 in | Wt 264.0 lb

## 2022-02-07 DIAGNOSIS — E8881 Metabolic syndrome: Secondary | ICD-10-CM

## 2022-02-07 DIAGNOSIS — E7849 Other hyperlipidemia: Secondary | ICD-10-CM

## 2022-02-07 DIAGNOSIS — E669 Obesity, unspecified: Secondary | ICD-10-CM | POA: Diagnosis not present

## 2022-02-07 DIAGNOSIS — Z9189 Other specified personal risk factors, not elsewhere classified: Secondary | ICD-10-CM

## 2022-02-07 DIAGNOSIS — Z6841 Body Mass Index (BMI) 40.0 and over, adult: Secondary | ICD-10-CM

## 2022-02-07 MED ORDER — LIRAGLUTIDE 18 MG/3ML ~~LOC~~ SOPN: 1.8000 mg | PEN_INJECTOR | Freq: Every day | SUBCUTANEOUS | 0 refills | Status: DC

## 2022-02-08 NOTE — Progress Notes (Signed)
Chief Complaint:   OBESITY Joy [name] is here to discuss her progress with her obesity treatment plan along with follow-up of her obesity related diagnoses. Joy [name] is on the Category 2 Plan and states she is following her eating plan approximately 100% of the time. Joy [name] states she is walking 60 minutes 3 times per week.  Today's visit was #: 47 Starting weight: 268 lbs Starting date: 12/16/2019 Today's weight: 264 lbs Today's date: 02/07/2022 Total lbs lost to date: 4 lbs Total lbs lost since last in-office visit: 0  Interim History: Joy [name] enjoyed her birthday with her kids. Starting to get hungry around 2-3 pm and uses grapes for a snack. She is eating Calorie 1 bar or crackers + cheese for snack. Supper she is getting 6 oz of meat + veggies. She is not eating bread at breakfast. She is going to the beach for 5 days 4 nights stay at [place].  Subjective:   1. Insulin resistance Joy [name] is currently on Victoza 1.8 mg. Some occasional hunger.  2. Other hyperlipidemia Joy [name] is currently taking Lipitor 20 mg. Her LDL still elevated at 118, HDL of 45, and [place]ig[place] 73.  3. At risk for heart disease Joy [name] is at higher than average risk for cardiovascular disease due to obesity.  Assessment/Plan:   1. Insulin resistance We will refill Victoza 1.8 mg SubQ daily for 1 month with 0 refills.  -Refill liraglutide (VICTOZA) 18 MG/3ML SOPN; Inject 1.8 mg into the skin daily.  Dispense: 9 mL; Refill: 0  2. Other hyperlipidemia Joy [name] will continue on Lipitor with no changes in dose. Will obtain labs in 3 months.  3. At risk for heart disease Joy [name] was given approximately 15 minutes of coronary artery disease prevention counseling today. She is 56 y.o. female and has risk factors for heart disease including obesity. We discussed intensive lifestyle modifications today with an emphasis on specific weight loss instructions and strategies.  Repetitive spaced learning  was employed today to elicit superior memory formation and behavioral change.   4. Obesity with current BMI of 45.5 Joy [name] is currently in the action stage of change. As such, her goal is to continue with weight loss efforts. She has agreed to the Category 2 Plan.   Exercise goals: All adults should avoid inactivity. Some physical activity is better than none, and adults who participate in any amount of physical activity gain some health benefits.  Joy [name] is to start water aerobics 2 times a week for 3 weeks.  Behavioral modification strategies: increasing lean protein intake, meal planning and cooking strategies, and keeping healthy foods in the home.  Joy [name] has agreed to follow-up with our clinic in 5 weeks. She was informed of the importance of frequent follow-up visits to maximize her success with intensive lifestyle modifications for her multiple health conditions.   Objective:   Blood pressure 111/74, pulse 81, temperature 98.1 F (36.7 C), height '5\' 4"'$  (1.626 m), weight 264 lb (119.7 kg), SpO2 99 %. Body mass index is 45.32 kg/m.  General: Cooperative, alert, well developed, in no acute distress. HEENT: Conjunctivae and lids unremarkable. Cardiovascular: Regular rhythm.  Lungs: Normal work of breathing. Neurologic: No focal deficits.   Lab Results  Component Value Date   CREATININE 1.12 (H) 01/11/2022   BUN 11 01/11/2022   NA 143 01/11/2022   K 4.1 01/11/2022   CL 105 01/11/2022   CO2 26 01/11/2022   Lab Results  Component Value Date   ALT 8 01/11/2022   AST  19 01/11/2022   ALKPHOS 100 01/11/2022   BILITOT 0.4 01/11/2022   Lab Results  Component Value Date   HGBA1C 5.1 01/11/2022   HGBA1C 5.3 09/20/2021   HGBA1C 5.4 05/24/2021   HGBA1C 5.3 02/07/2021   HGBA1C 5.3 09/28/2020   Lab Results  Component Value Date   INSULIN 14.1 01/11/2022   INSULIN 12.6 05/24/2021   INSULIN 7.1 02/07/2021   INSULIN 10.9 09/28/2020   INSULIN 24.0 04/08/2020   Lab  Results  Component Value Date   TSH 2.100 12/16/2019   Lab Results  Component Value Date   CHOL 177 01/11/2022   HDL 45 01/11/2022   LDLCALC 118 (H) 01/11/2022   TRIG 73 01/11/2022   CHOLHDL 3.7 02/07/2021   Lab Results  Component Value Date   VD25OH 50.4 01/11/2022   VD25OH 58.3 05/24/2021   VD25OH 42.1 02/07/2021   Lab Results  Component Value Date   WBC 6.5 12/16/2019   HGB 11.4 12/16/2019   HCT 35.2 12/16/2019   MCV 93 12/16/2019   PLT 277 12/16/2019   Lab Results  Component Value Date   IRON 46 11/25/2019   TIBC 252 09/26/2013   FERRITIN 242.6 11/25/2019   Attestation Statements:   Reviewed by clinician on day of visit: allergies, medications, problem list, medical history, surgical history, family history, social history, and previous encounter notes.  I, Elnora Morrison, RMA am acting as transcriptionist for Coralie Common, MD. I have reviewed the above documentation for accuracy and completeness, and I agree with the above. - Coralie Common, MD

## 2022-02-16 DIAGNOSIS — Z79899 Other long term (current) drug therapy: Secondary | ICD-10-CM | POA: Diagnosis not present

## 2022-02-16 DIAGNOSIS — M0549 Rheumatoid myopathy with rheumatoid arthritis of multiple sites: Secondary | ICD-10-CM | POA: Diagnosis not present

## 2022-03-02 DIAGNOSIS — M0609 Rheumatoid arthritis without rheumatoid factor, multiple sites: Secondary | ICD-10-CM | POA: Diagnosis not present

## 2022-03-02 DIAGNOSIS — Z79899 Other long term (current) drug therapy: Secondary | ICD-10-CM | POA: Diagnosis not present

## 2022-03-02 DIAGNOSIS — M159 Polyosteoarthritis, unspecified: Secondary | ICD-10-CM | POA: Diagnosis not present

## 2022-03-03 DIAGNOSIS — Z79899 Other long term (current) drug therapy: Secondary | ICD-10-CM | POA: Diagnosis not present

## 2022-03-03 DIAGNOSIS — M0609 Rheumatoid arthritis without rheumatoid factor, multiple sites: Secondary | ICD-10-CM | POA: Diagnosis not present

## 2022-03-03 DIAGNOSIS — Z1159 Encounter for screening for other viral diseases: Secondary | ICD-10-CM | POA: Diagnosis not present

## 2022-03-10 DIAGNOSIS — M25561 Pain in right knee: Secondary | ICD-10-CM | POA: Diagnosis not present

## 2022-03-10 DIAGNOSIS — M170 Bilateral primary osteoarthritis of knee: Secondary | ICD-10-CM | POA: Diagnosis not present

## 2022-03-10 DIAGNOSIS — S83241A Other tear of medial meniscus, current injury, right knee, initial encounter: Secondary | ICD-10-CM | POA: Diagnosis not present

## 2022-03-14 ENCOUNTER — Ambulatory Visit (INDEPENDENT_AMBULATORY_CARE_PROVIDER_SITE_OTHER): Payer: Medicare Other | Admitting: Family Medicine

## 2022-03-14 ENCOUNTER — Encounter (INDEPENDENT_AMBULATORY_CARE_PROVIDER_SITE_OTHER): Payer: Self-pay | Admitting: Family Medicine

## 2022-03-14 VITALS — BP 120/82 | HR 85 | Temp 97.6°F | Ht 64.0 in | Wt 264.0 lb

## 2022-03-14 DIAGNOSIS — E669 Obesity, unspecified: Secondary | ICD-10-CM | POA: Diagnosis not present

## 2022-03-14 DIAGNOSIS — Z6841 Body Mass Index (BMI) 40.0 and over, adult: Secondary | ICD-10-CM | POA: Diagnosis not present

## 2022-03-14 DIAGNOSIS — E7849 Other hyperlipidemia: Secondary | ICD-10-CM

## 2022-03-14 DIAGNOSIS — E8881 Metabolic syndrome: Secondary | ICD-10-CM

## 2022-03-14 MED ORDER — LIRAGLUTIDE 18 MG/3ML ~~LOC~~ SOPN
1.8000 mg | PEN_INJECTOR | Freq: Every day | SUBCUTANEOUS | 0 refills | Status: DC
Start: 2022-03-14 — End: 2022-04-05

## 2022-03-16 NOTE — Progress Notes (Signed)
Chief Complaint:   OBESITY Joy [name] is here to discuss her progress with her obesity treatment plan along with follow-up of her obesity related diagnoses. Joy [name] is on the Category 2 Plan and states she is following her eating plan approximately 100% of the time. Joy [name] states she is bicycling/water aerobics  2-5 times per week.  Today's visit was #: 33 Starting weight: 268 lbs Starting date: 12/16/2019 Today's weight: 264 lbs Today's date: 03/14/2022 Total lbs lost to date: 4 lbs Total lbs lost since last in-office visit: 0  Interim History: Joy [name] needs 4 lbs gone by October for knee clean out. She wants to be completely off red meat. She is interested in changing meal plan. Follow Cat 2 daily--sometimes grits and toast or 2 boiled eggs with cheese toast. Lunch is [place] sandwich. Dinner is red meat and or chicken with noodles and vegetables. Snack calories are bars (protein). No up coming plans to go anywhere of do anything.  Subjective:   1. Insulin resistance Joy [name] is doing reasonably will on Victoza. Denies GI side effects. Her last labs were obtained on 01/11/22.  2. Other hyperlipidemia Joy [name] is currently taking Lipitor 20 mg. Still a slight elevation of LDL.  Assessment/Plan:   1. Insulin resistance We will refill Victoza 1.8 mg SubQ daily for 1 month with 0 refills.  -Refill liraglutide (VICTOZA) 18 MG/3ML SOPN; Inject 1.8 mg into the skin daily.  Dispense: 9 mL; Refill: 0  2. Other hyperlipidemia Joy [name] will repeat labs in September 2023.  3. Obesity with current BMI of 45.3 Joy [name] is currently in the action stage of change. As such, her goal is to continue with weight loss efforts. She has agreed to the [employer].   Exercise goals: All adults should avoid inactivity. Some physical activity is better than none, and adults who participate in any amount of physical activity gain some health benefits.  Behavioral modification  strategies: increasing lean protein intake, meal planning and cooking strategies, keeping healthy foods in the home, and planning for success.  Joy [name] has agreed to follow-up with our clinic in 4 weeks. She was informed of the importance of frequent follow-up visits to maximize her success with intensive lifestyle modifications for her multiple health conditions.   Objective:   Blood pressure 120/82, pulse 85, temperature 97.6 F (36.4 C), height '5\' 4"'$  (1.626 m), weight 264 lb (119.7 kg), SpO2 99 %. Body mass index is 45.32 kg/m.  General: Cooperative, alert, well developed, in no acute distress. HEENT: Conjunctivae and lids unremarkable. Cardiovascular: Regular rhythm.  Lungs: Normal work of breathing. Neurologic: No focal deficits.   Lab Results  Component Value Date   CREATININE 1.12 (H) 01/11/2022   BUN 11 01/11/2022   NA 143 01/11/2022   K 4.1 01/11/2022   CL 105 01/11/2022   CO2 26 01/11/2022   Lab Results  Component Value Date   ALT 8 01/11/2022   AST 19 01/11/2022   ALKPHOS 100 01/11/2022   BILITOT 0.4 01/11/2022   Lab Results  Component Value Date   HGBA1C 5.1 01/11/2022   HGBA1C 5.3 09/20/2021   HGBA1C 5.4 05/24/2021   HGBA1C 5.3 02/07/2021   HGBA1C 5.3 09/28/2020   Lab Results  Component Value Date   INSULIN 14.1 01/11/2022   INSULIN 12.6 05/24/2021   INSULIN 7.1 02/07/2021   INSULIN 10.9 09/28/2020   INSULIN 24.0 04/08/2020   Lab Results  Component Value Date   TSH 2.100 12/16/2019   Lab Results  Component Value  Date   CHOL 177 01/11/2022   HDL 45 01/11/2022   LDLCALC 118 (H) 01/11/2022   TRIG 73 01/11/2022   CHOLHDL 3.7 02/07/2021   Lab Results  Component Value Date   VD25OH 50.4 01/11/2022   VD25OH 58.3 05/24/2021   VD25OH 42.1 02/07/2021   Lab Results  Component Value Date   WBC 6.5 12/16/2019   HGB 11.4 12/16/2019   HCT 35.2 12/16/2019   MCV 93 12/16/2019   PLT 277 12/16/2019   Lab Results  Component Value Date   IRON 46  11/25/2019   TIBC 252 09/26/2013   FERRITIN 242.6 11/25/2019    Obesity Behavioral Intervention:   Approximately 15 minutes were spent on the discussion below.  ASK: We discussed the diagnosis of obesity with Joy [name] today and Joy [name] agreed to give [name] permission to discuss obesity behavioral modification therapy today.  ASSESS: Joy [name] has the diagnosis of obesity and her BMI today is 45.3. Joy [name] is in the action stage of change.   ADVISE: Joy [name] was educated on the multiple health risks of obesity as well as the benefit of weight loss to improve her health. She was advised of the need for long term treatment and the importance of lifestyle modifications to improve her current health and to decrease her risk of future health problems.  AGREE: Multiple dietary modification options and treatment options were discussed and Joy [name] agreed to follow the recommendations documented in the above note.  ARRANGE: Joy [name] was educated on the importance of frequent visits to treat obesity as outlined per CMS and USPSTF guidelines and agreed to schedule her next follow up appointment today.  Attestation Statements:   Reviewed by clinician on day of visit: allergies, medications, problem list, medical history, surgical history, family history, social history, and previous encounter notes.  I, Elnora Morrison, RMA am acting as transcriptionist for Coralie Common, MD.  I have reviewed the above documentation for accuracy and completeness, and I agree with the above. - Coralie Common, MD

## 2022-03-20 ENCOUNTER — Telehealth: Payer: Self-pay | Admitting: Family

## 2022-03-20 ENCOUNTER — Ambulatory Visit (INDEPENDENT_AMBULATORY_CARE_PROVIDER_SITE_OTHER): Payer: Medicare Other | Admitting: Family

## 2022-03-20 VITALS — BP 122/70 | HR 77 | Temp 98.1°F | Resp 16 | Ht 64.0 in | Wt 264.0 lb

## 2022-03-20 DIAGNOSIS — J4540 Moderate persistent asthma, uncomplicated: Secondary | ICD-10-CM

## 2022-03-20 DIAGNOSIS — N189 Chronic kidney disease, unspecified: Secondary | ICD-10-CM | POA: Diagnosis not present

## 2022-03-20 DIAGNOSIS — I10 Essential (primary) hypertension: Secondary | ICD-10-CM

## 2022-03-20 DIAGNOSIS — M069 Rheumatoid arthritis, unspecified: Secondary | ICD-10-CM | POA: Diagnosis not present

## 2022-03-20 DIAGNOSIS — R7303 Prediabetes: Secondary | ICD-10-CM | POA: Diagnosis not present

## 2022-03-20 DIAGNOSIS — E785 Hyperlipidemia, unspecified: Secondary | ICD-10-CM

## 2022-03-20 DIAGNOSIS — R21 Rash and other nonspecific skin eruption: Secondary | ICD-10-CM | POA: Diagnosis not present

## 2022-03-20 DIAGNOSIS — E538 Deficiency of other specified B group vitamins: Secondary | ICD-10-CM | POA: Diagnosis not present

## 2022-03-20 LAB — B12 AND FOLATE PANEL
Folate: 5.3 ng/mL — ABNORMAL LOW (ref >5.9)
Vitamin B-12: 88 pg/mL — ABNORMAL LOW (ref 211–911)

## 2022-03-20 MED ORDER — BETAMETHASONE DIPROPIONATE 0.05 % EX CREA: TOPICAL_CREAM | Freq: Two times a day (BID) | CUTANEOUS | 0 refills | Status: DC

## 2022-03-20 MED ORDER — QVAR REDIHALER 40 MCG/ACT IN AERB: 2.0000 | INHALATION_SPRAY | Freq: Two times a day (BID) | RESPIRATORY_TRACT | 5 refills | Status: DC
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
## 2022-03-20 NOTE — Assessment & Plan Note (Signed)
Lab Results  Component Value Date   HGBA1C 5.1 01/11/2022   HGBA1C 5.3 09/20/2021   HGBA1C 5.4 05/24/2021   Lab Results  Component Value Date   LDLCALC 118 (H) 01/11/2022   CREATININE 1.12 (H) 01/11/2022

## 2022-03-20 NOTE — Assessment & Plan Note (Addendum)
Continues to follow with rheumatology.  Maintained on plaquenil and Arava.

## 2022-03-20 NOTE — Assessment & Plan Note (Signed)
BP Readings from Last 3 Encounters:  03/20/22 122/70  03/14/22 120/82  02/07/22 111/74   At goal on lisinopril. Continue same.

## 2022-03-20 NOTE — Assessment & Plan Note (Signed)
Trial of betamethasone cream bid. She also has an appointment upcoming with her allergist.

## 2022-03-20 NOTE — Assessment & Plan Note (Signed)
Lab Results  Component Value Date   CREATININE 1.12 (H) 01/11/2022

## 2022-03-20 NOTE — Telephone Encounter (Signed)
Patient's prescriptions were called into [facility] but insurance does not cover them unless it's done through mail order at [facility]. The patient provided the phone number [phone] and fax [phone].   Patient is requesting beclomethasone (QVAR REDIHALER) 40 MCG/ACT inhaler [[id number]] and betamethasone dipropionate 0.05 % cream [[id number]]

## 2022-03-20 NOTE — Assessment & Plan Note (Signed)
Lab Results  Component Value Date   CHOL 177 01/11/2022   HDL 45 01/11/2022   LDLCALC 118 (H) 01/11/2022   TRIG 73 01/11/2022   CHOLHDL 3.7 02/07/2021   Stable on lipitor. Continue same.

## 2022-03-20 NOTE — Assessment & Plan Note (Signed)
Stable with qvar 3mg bid, rare use of albuterol.

## 2022-03-21 ENCOUNTER — Telehealth: Payer: Self-pay | Admitting: Family

## 2022-03-21 NOTE — Telephone Encounter (Signed)
B12 is very low. Please begin b12 1069mg IM once weekly for 4 weeks, then once monthly.

## 2022-03-22 ENCOUNTER — Encounter (INDEPENDENT_AMBULATORY_CARE_PROVIDER_SITE_OTHER): Payer: Self-pay

## 2022-03-22 ENCOUNTER — Other Ambulatory Visit: Payer: Self-pay

## 2022-03-22 MED ORDER — BETAMETHASONE DIPROPIONATE 0.05 % EX CREA: TOPICAL_CREAM | Freq: Two times a day (BID) | CUTANEOUS | 0 refills | Status: DC

## 2022-03-22 MED ORDER — QVAR REDIHALER 40 MCG/ACT IN AERB: 2.0000 | INHALATION_SPRAY | Freq: Two times a day (BID) | RESPIRATORY_TRACT | 5 refills | Status: DC

## 2022-03-22 NOTE — Telephone Encounter (Signed)
Patient advised of results and scheduled for 1st B12 tomorrow

## 2022-03-22 NOTE — Telephone Encounter (Signed)
Both prescriptions sent to mail order as requested

## 2022-03-23 ENCOUNTER — Ambulatory Visit (INDEPENDENT_AMBULATORY_CARE_PROVIDER_SITE_OTHER): Payer: Medicare Other

## 2022-03-23 DIAGNOSIS — E538 Deficiency of other specified B group vitamins: Secondary | ICD-10-CM | POA: Diagnosis not present

## 2022-03-23 DIAGNOSIS — R21 Rash and other nonspecific skin eruption: Secondary | ICD-10-CM | POA: Diagnosis not present

## 2022-03-23 MED ORDER — CYANOCOBALAMIN 1000 MCG/ML IJ SOLN
1000.0000 ug | Freq: Once | INTRAMUSCULAR | Status: AC
  Administered 2022-03-23: 1000 ug via INTRAMUSCULAR

## 2022-03-23 NOTE — Progress Notes (Signed)
Joy [name] is a 56 y.o. female presents to the office today for B-12 injections, per physician's orders. Original order: Debbrah Alar, NP B12 is very low. Please begin b12 1091mg IM once weekly for 4 weeks, then once monthly.  B-12 (med), 1/000 mg (dose),  left deltoid  (route) was administered  (location) today. Patient tolerated injection.    Patient next injection due: 03/30/22, appt made Yes  Quinntin Malter M Emmalyn Hinson

## 2022-03-27 ENCOUNTER — Other Ambulatory Visit: Payer: Self-pay

## 2022-03-27 DIAGNOSIS — J4540 Moderate persistent asthma, uncomplicated: Secondary | ICD-10-CM

## 2022-03-27 MED ORDER — FLOVENT DISKUS 50 MCG/ACT IN AEPB: 2.0000 | INHALATION_SPRAY | Freq: Two times a day (BID) | RESPIRATORY_TRACT | 3 refills | Status: AC

## 2022-03-30 ENCOUNTER — Ambulatory Visit (INDEPENDENT_AMBULATORY_CARE_PROVIDER_SITE_OTHER): Payer: Medicare Other

## 2022-03-30 DIAGNOSIS — E538 Deficiency of other specified B group vitamins: Secondary | ICD-10-CM

## 2022-03-30 MED ORDER — CYANOCOBALAMIN 1000 MCG/ML IJ SOLN
1000.0000 ug | Freq: Once | INTRAMUSCULAR | Status: AC
  Administered 2022-03-30: 1000 ug via INTRAMUSCULAR

## 2022-03-30 NOTE — Progress Notes (Signed)
Joy [name] is a 56 y.o. female presents to the office today for B12 #2 of 4 weekly  injections, per physician's orders. Original order: on telephone note from 03/21/22 begin b12 1016mg IM once weekly for 4 weeks, then once monthly.  Cyanocobalamin (med), 1000 mg/ml (dose),  IM (route) was administered Right deltoid (location) today. Patient tolerated injection.  Patient next injection due: in one week, appt made Yes for 04/06/22 for B12 injection # 3 of 4 weekly.   RJiles Prows

## 2022-04-05 ENCOUNTER — Encounter (INDEPENDENT_AMBULATORY_CARE_PROVIDER_SITE_OTHER): Payer: Self-pay | Admitting: Family Medicine

## 2022-04-05 ENCOUNTER — Ambulatory Visit (INDEPENDENT_AMBULATORY_CARE_PROVIDER_SITE_OTHER): Payer: Medicare Other | Admitting: Family Medicine

## 2022-04-05 VITALS — BP 112/78 | HR 82 | Temp 98.3°F | Ht 64.0 in | Wt 259.0 lb

## 2022-04-05 DIAGNOSIS — Z6841 Body Mass Index (BMI) 40.0 and over, adult: Secondary | ICD-10-CM | POA: Diagnosis not present

## 2022-04-05 DIAGNOSIS — E8881 Metabolic syndrome: Secondary | ICD-10-CM

## 2022-04-05 DIAGNOSIS — E538 Deficiency of other specified B group vitamins: Secondary | ICD-10-CM | POA: Diagnosis not present

## 2022-04-05 DIAGNOSIS — E669 Obesity, unspecified: Secondary | ICD-10-CM | POA: Diagnosis not present

## 2022-04-05 MED ORDER — BD PEN NEEDLE NANO 2ND GEN 32G X 4 MM MISC: 0 refills | Status: DC

## 2022-04-05 MED ORDER — LIRAGLUTIDE 18 MG/3ML ~~LOC~~ SOPN: 1.8000 mg | PEN_INJECTOR | Freq: Every day | SUBCUTANEOUS | 0 refills | Status: DC

## 2022-04-06 ENCOUNTER — Ambulatory Visit (INDEPENDENT_AMBULATORY_CARE_PROVIDER_SITE_OTHER): Payer: Medicare Other

## 2022-04-06 DIAGNOSIS — E538 Deficiency of other specified B group vitamins: Secondary | ICD-10-CM | POA: Diagnosis not present

## 2022-04-06 MED ORDER — CYANOCOBALAMIN 1000 MCG/ML IJ SOLN
1000.0000 ug | Freq: Once | INTRAMUSCULAR | Status: AC
  Administered 2022-04-06: 1000 ug via INTRAMUSCULAR

## 2022-04-06 NOTE — Progress Notes (Signed)
Pt here for week 3 of 4 B12 injection per Melissa  B12 1044mg given L deltoid IM, and pt tolerated injection well.  Next B12 injection scheduled for 1 week.

## 2022-04-10 DIAGNOSIS — M0609 Rheumatoid arthritis without rheumatoid factor, multiple sites: Secondary | ICD-10-CM | POA: Diagnosis not present

## 2022-04-12 NOTE — Progress Notes (Unsigned)
Joy [name] is a 56 y.o. female presents to the office today for 4/4 weekly B12 injection, per physician's orders. Original order: " 03/21/22 begin b12 1054mg IM once weekly for 4 weeks, then once monthly." Cyanocobalamin (med), 1000 mg/ml (dose),  IM administered R Deltoid (location) today. Patient tolerated injection. Patient due for follow up labs/provider appt: No.  Patient next injection due: 1 month, appt made Yes  [place], TKristine GarbeL

## 2022-04-13 ENCOUNTER — Ambulatory Visit (INDEPENDENT_AMBULATORY_CARE_PROVIDER_SITE_OTHER): Payer: Medicare Other

## 2022-04-13 DIAGNOSIS — E538 Deficiency of other specified B group vitamins: Secondary | ICD-10-CM

## 2022-04-13 MED ORDER — CYANOCOBALAMIN 1000 MCG/ML IJ SOLN
1000.0000 ug | Freq: Once | INTRAMUSCULAR | Status: AC
  Administered 2022-04-13: 1000 ug via INTRAMUSCULAR

## 2022-04-16 NOTE — Progress Notes (Signed)
Chief Complaint:   OBESITY Joy [name] is here to discuss her progress with her obesity treatment plan along with follow-up of her obesity related diagnoses. Joy [name] is on the [employer] and states she is following her eating plan approximately 100% of the time. Joy [name] states she is doing water aerobics for 45 minutes 3 times per week.  Today's visit was #: 53 Starting weight: 268 lbs Starting date: 12/16/2019 Today's weight: 259 lbs Today's date: 04/05/2022 Total lbs lost to date: 9 lbs Total lbs lost since last in-office visit: 5 lbs  Interim History: Joy [name] has found a good combination of food choices and options over the last few weeks. Lunch is often a sandwich with [place] and mustard and a piece of fruit, plus or minus a protein bar, and then 6-8 oz if fish at supper with vegetables. She reports no plans for Labor Day. Joy [name] wants to continue her current plan/food and exercise the next few weeks.  Subjective:   1. Insulin resistance Joy [name] is doing well on Victoza 1.8 mg and with no GI symptoms.  2. Vitamin B12 deficiency Joy [name]'s B12 level is 88 and has received 3 injections so far.  3. Folate deficiency Joy [name]'s Folate level is 5.3  Assessment/Plan:   1. Insulin resistance Will refill Victoza, and needles as follows: - Insulin Pen Needle (BD PEN NEEDLE NANO 2ND GEN) 32G X 4 MM MISC; Use 1 needle daily to inject Victoza.  Dispense: 100 each; Refill: 0 - liraglutide (VICTOZA) 18 MG/3ML SOPN; Inject 1.8 mg into the skin daily.  Dispense: 9 mL; Refill: 0  2. Vitamin B12 deficiency Will follow up on B12 level after her next injection.  3. Folate deficiency Joy [name] was encourage to start Prenatal vitamins.  4. Obesity with current BMI of 44.5 Joy [name] is currently in the action stage of change. As such, her goal is to continue with weight loss efforts. She has agreed to the [employer].   Exercise goals: All adults should avoid  inactivity. Some physical activity is better than none, and adults who participate in any amount of physical activity gain some health benefits.  Behavioral modification strategies: increasing lean protein intake, meal planning and cooking strategies, keeping healthy foods in the home, and planning for success.  Joy [name] has agreed to follow-up with our clinic in 4 weeks. She was informed of the importance of frequent follow-up visits to maximize her success with intensive lifestyle modifications for her multiple health conditions.   Objective:   Blood pressure 112/78, pulse 82, temperature 98.3 F (36.8 C), height '5\' 4"'$  (1.626 m), weight 259 lb (117.5 kg), SpO2 99 %. Body mass index is 44.46 kg/m.  General: Cooperative, alert, well developed, in no acute distress. HEENT: Conjunctivae and lids unremarkable. Cardiovascular: Regular rhythm.  Lungs: Normal work of breathing. Neurologic: No focal deficits.   Lab Results  Component Value Date   CREATININE 1.12 (H) 01/11/2022   BUN 11 01/11/2022   NA 143 01/11/2022   K 4.1 01/11/2022   CL 105 01/11/2022   CO2 26 01/11/2022   Lab Results  Component Value Date   ALT 8 01/11/2022   AST 19 01/11/2022   ALKPHOS 100 01/11/2022   BILITOT 0.4 01/11/2022   Lab Results  Component Value Date   HGBA1C 5.1 01/11/2022   HGBA1C 5.3 09/20/2021   HGBA1C 5.4 05/24/2021   HGBA1C 5.3 02/07/2021   HGBA1C 5.3 09/28/2020   Lab Results  Component Value Date   INSULIN 14.1 01/11/2022  INSULIN 12.6 05/24/2021   INSULIN 7.1 02/07/2021   INSULIN 10.9 09/28/2020   INSULIN 24.0 04/08/2020   Lab Results  Component Value Date   TSH 2.100 12/16/2019   Lab Results  Component Value Date   CHOL 177 01/11/2022   HDL 45 01/11/2022   LDLCALC 118 (H) 01/11/2022   TRIG 73 01/11/2022   CHOLHDL 3.7 02/07/2021   Lab Results  Component Value Date   VD25OH 50.4 01/11/2022   VD25OH 58.3 05/24/2021   VD25OH 42.1 02/07/2021   Lab Results  Component  Value Date   WBC 6.5 12/16/2019   HGB 11.4 12/16/2019   HCT 35.2 12/16/2019   MCV 93 12/16/2019   PLT 277 12/16/2019   Lab Results  Component Value Date   IRON 46 11/25/2019   TIBC 252 09/26/2013   FERRITIN 242.6 11/25/2019   Attestation Statements:   Reviewed by clinician on day of visit: allergies, medications, problem list, medical history, surgical history, family history, social history, and previous encounter notes.  ILennette Bihari, CMA, am acting as transcriptionist for Dr. Coralie Common, MD.   I have reviewed the above documentation for accuracy and completeness, and I agree with the above. - Coralie Common, MD

## 2022-04-27 ENCOUNTER — Ambulatory Visit (INDEPENDENT_AMBULATORY_CARE_PROVIDER_SITE_OTHER): Payer: Medicare Other | Admitting: Family Medicine

## 2022-04-27 ENCOUNTER — Encounter (INDEPENDENT_AMBULATORY_CARE_PROVIDER_SITE_OTHER): Payer: Self-pay | Admitting: Family Medicine

## 2022-04-27 VITALS — BP 121/81 | HR 93 | Temp 98.2°F | Ht 64.0 in | Wt 253.0 lb

## 2022-04-27 DIAGNOSIS — E669 Obesity, unspecified: Secondary | ICD-10-CM | POA: Diagnosis not present

## 2022-04-27 DIAGNOSIS — E538 Deficiency of other specified B group vitamins: Secondary | ICD-10-CM

## 2022-04-27 DIAGNOSIS — Z6841 Body Mass Index (BMI) 40.0 and over, adult: Secondary | ICD-10-CM | POA: Diagnosis not present

## 2022-05-01 NOTE — Progress Notes (Unsigned)
Chief Complaint:   OBESITY Joy [name] is here to discuss her progress with her obesity treatment plan along with follow-up of her obesity related diagnoses. Joy [name] is on the [employer] and states she is following her eating plan approximately 100% of the time. Joy [name] states she is doing water aerobics/bike 45/30 minutes 3/2 times per week.  Today's visit was #: 62 Starting weight: 268 lbs Starting date: 12/16/2019 Today's weight: 253 lbs Today's date: 04/27/2022 Total lbs lost to date: 15 lbs Total lbs lost since last in-office visit: 6  Interim History: Joy [name] has a significant amount of life stressors with her living situation. She has had decrease drive to eat but is still getting all food in. She may switch out tuna packet with sandwich. Plans to stick on same plan for next few weeks.  Subjective:   1. Vitamin B12 deficiency B12 level of 88. Joy [name] got weekly B12 shots now getting monthly.  Assessment/Plan:   1. Vitamin B12 deficiency B12 level per PCP.  2. Obesity with current BMI of 43.5 Joy [name] is currently in the action stage of change. As such, her goal is to continue with weight loss efforts. She has agreed to the [employer].   Exercise goals: As is. Add resistance 10 min when doing bike.  Behavioral modification strategies: increasing lean protein intake and planning for success.  Joy [name] has agreed to follow-up with our clinic in 4 weeks. She was informed of the importance of frequent follow-up visits to maximize her success with intensive lifestyle modifications for her multiple health conditions.   Objective:   Blood pressure 121/81, pulse 93, temperature 98.2 F (36.8 C), height '5\' 4"'$  (1.626 m), weight 253 lb (114.8 kg), SpO2 98 %. Body mass index is 43.43 kg/m.  General: Cooperative, alert, well developed, in no acute distress. HEENT: Conjunctivae and lids unremarkable. Cardiovascular: Regular rhythm.  Lungs: Normal work of  breathing. Neurologic: No focal deficits.   Lab Results  Component Value Date   CREATININE 1.12 (H) 01/11/2022   BUN 11 01/11/2022   NA 143 01/11/2022   K 4.1 01/11/2022   CL 105 01/11/2022   CO2 26 01/11/2022   Lab Results  Component Value Date   ALT 8 01/11/2022   AST 19 01/11/2022   ALKPHOS 100 01/11/2022   BILITOT 0.4 01/11/2022   Lab Results  Component Value Date   HGBA1C 5.1 01/11/2022   HGBA1C 5.3 09/20/2021   HGBA1C 5.4 05/24/2021   HGBA1C 5.3 02/07/2021   HGBA1C 5.3 09/28/2020   Lab Results  Component Value Date   INSULIN 14.1 01/11/2022   INSULIN 12.6 05/24/2021   INSULIN 7.1 02/07/2021   INSULIN 10.9 09/28/2020   INSULIN 24.0 04/08/2020   Lab Results  Component Value Date   TSH 2.100 12/16/2019   Lab Results  Component Value Date   CHOL 177 01/11/2022   HDL 45 01/11/2022   LDLCALC 118 (H) 01/11/2022   TRIG 73 01/11/2022   CHOLHDL 3.7 02/07/2021   Lab Results  Component Value Date   VD25OH 50.4 01/11/2022   VD25OH 58.3 05/24/2021   VD25OH 42.1 02/07/2021   Lab Results  Component Value Date   WBC 6.5 12/16/2019   HGB 11.4 12/16/2019   HCT 35.2 12/16/2019   MCV 93 12/16/2019   PLT 277 12/16/2019   Lab Results  Component Value Date   IRON 46 11/25/2019   TIBC 252 09/26/2013   FERRITIN 242.6 11/25/2019   Attestation Statements:   Reviewed by clinician  on day of visit: allergies, medications, problem list, medical history, surgical history, family history, social history, and previous encounter notes.  I, Elnora Morrison, RMA am acting as transcriptionist for Coralie Common, MD. I have reviewed the above documentation for accuracy and completeness, and I agree with the above. - Coralie Common, MD

## 2022-05-11 ENCOUNTER — Telehealth: Payer: Self-pay | Admitting: Family

## 2022-05-11 NOTE — Telephone Encounter (Signed)
Pt received EMMI call for A1C. Patient wanted to have test done same day she comes for her b12 injection. Please place order for A1C.

## 2022-05-11 NOTE — Telephone Encounter (Signed)
Please advise if patient is in need of A1C.

## 2022-05-12 NOTE — Telephone Encounter (Signed)
Patient advised per pcp, she does not need a HgA1c at this time

## 2022-05-16 ENCOUNTER — Other Ambulatory Visit: Payer: Medicare Other

## 2022-05-16 ENCOUNTER — Ambulatory Visit: Payer: Medicare Other

## 2022-05-16 NOTE — Progress Notes (Deleted)
Joy [name] is a 56 y.o. female presents to the office today for 4/4 weekly B12 injection, per physician's orders. Original order: " 03/21/22 begin b12 1015mg IM once weekly for 4 weeks, then once monthly." Cyanocobalamin (med), 1000 mg/ml (dose),  IM administered R Deltoid (location) today. Patient tolerated injection. Patient due for follow up labs/provider appt: No.  Patient next injection due: 1 month, appt made Yes

## 2022-05-18 ENCOUNTER — Encounter: Payer: Self-pay | Admitting: Family

## 2022-05-22 ENCOUNTER — Ambulatory Visit (INDEPENDENT_AMBULATORY_CARE_PROVIDER_SITE_OTHER): Payer: Medicare Other

## 2022-05-22 ENCOUNTER — Ambulatory Visit: Payer: Medicare Other

## 2022-05-22 ENCOUNTER — Telehealth: Payer: Self-pay | Admitting: *Deleted

## 2022-05-22 VITALS — Ht 64.0 in | Wt 253.0 lb

## 2022-05-22 DIAGNOSIS — Z1231 Encounter for screening mammogram for malignant neoplasm of breast: Secondary | ICD-10-CM

## 2022-05-22 DIAGNOSIS — Z0000 Encounter for general adult medical examination without abnormal findings: Secondary | ICD-10-CM

## 2022-05-22 NOTE — Patient Instructions (Signed)
[name] , Thank you for taking time to complete  your Medicare Wellness Visit. I appreciate your ongoing commitment to your health goals. Please review the following plan we discussed and let me know if I can assist you in the future.   Screening recommendations/referrals: Colonoscopy: Completed 07/31/2019-Due 07/30/2029 Mammogram: Ordered today. Someone will call you to schedule. Bone Density: Due at age 56 Recommended yearly ophthalmology/optometry visit for glaucoma screening and checkup Recommended yearly dental visit for hygiene and checkup  Vaccinations: Influenza vaccine: Due-May obtain vaccine at our office or your local pharmacy. Pneumococcal vaccine: Up to date Tdap vaccine: Up to date Shingles vaccine: Completed vaccines  Covid-19: May obtain vaccine at your local pharmacy.  Advanced directives: May pick up information at your next visit  Conditions/risks identified: See problem list  Next appointment: Follow up in one year for your annual wellness visit.   Preventive Care 40-64 Years, Female Preventive care refers to lifestyle choices and visits with your health care provider that can promote health and wellness. What does preventive care include? A yearly physical exam. This is also called an annual well check. Dental exams once or twice a year. Routine eye exams. Ask your health care provider how often you should have your eyes checked. Personal lifestyle choices, including: Daily care of your teeth and gums. Regular physical activity. Eating a healthy diet. Avoiding tobacco and drug use. Limiting alcohol use. Practicing safe sex. Taking low-dose aspirin daily starting at age 24. Taking vitamin and mineral supplements as recommended by your health care provider. What happens during an annual well check? The services and screenings done by your health care provider during your annual well check will depend on your age, overall health, lifestyle risk factors,  and family history of disease. Counseling  Your health care provider may ask you questions about your: Alcohol use. Tobacco use. Drug use. Emotional well-being. Home and relationship well-being. Sexual activity. Eating habits. Work and work Statistician. Method of birth control. Menstrual cycle. Pregnancy history. Screening  You may have the following tests or measurements: Height, weight, and BMI. Blood pressure. Lipid and cholesterol levels. These may be checked every 5 years, or more frequently if you are over 45 years old. Skin check. Lung cancer screening. You may have this screening every year starting at age 72 if you have a 30-pack-year history of smoking and currently smoke or have quit within the past 15 years. Fecal occult blood test (FOBT) of the stool. You may have this test every year starting at age 54. Flexible sigmoidoscopy or colonoscopy. You may have a sigmoidoscopy every 5 years or a colonoscopy every 10 years starting at age 60. Hepatitis C blood test. Hepatitis B blood test. Sexually transmitted disease (STD) testing. Diabetes screening. This is done by checking your blood sugar (glucose) after you have not eaten for a while (fasting). You may have this done every 1-3 years. Mammogram. This may be done every 1-2 years. Talk to your health care provider about when you should start having regular mammograms. This may depend on whether you have a family history of breast cancer. BRCA-related cancer screening. This may be done if you have a family history of breast, ovarian, tubal, or peritoneal cancers. Pelvic exam and Pap test. This may be done every 3 years starting at age 71. Starting at age 65, this may be done every 5 years if you have a Pap test in combination with an HPV test. Bone density scan. This is done to screen for  osteoporosis. You may have this scan if you are at high risk for osteoporosis. Discuss your test results, treatment options, and if  necessary, the need for more tests with your health care provider. Vaccines  Your health care provider may recommend certain vaccines, such as: Influenza vaccine. This is recommended every year. Tetanus, diphtheria, and acellular pertussis (Tdap, Td) vaccine. You may need a Td booster every 10 years. Zoster vaccine. You may need this after age 68. Pneumococcal 13-valent conjugate (PCV13) vaccine. You may need this if you have certain conditions and were not previously vaccinated. Pneumococcal polysaccharide (PPSV23) vaccine. You may need one or two doses if you smoke cigarettes or if you have certain conditions. Talk to your health care provider about which screenings and vaccines you need and how often you need them. This information is not intended to replace advice given to you by your health care provider. Make sure you discuss any questions you have with your health care provider. Document Released: 08/27/2015 Document Revised: 04/19/2016 Document Reviewed: 06/01/2015 Elsevier Interactive Patient Education  2017 [place] Prevention in the Home Falls can cause injuries. They can happen to people of all ages. There are many things you can do to make your home safe and to help prevent falls. What can I do on the outside of my home? Regularly fix the edges of walkways and driveways and fix any cracks. Remove anything that might make you trip as you walk through a door, such as a raised step or threshold. Trim any bushes or trees on the path to your home. Use bright outdoor lighting. Clear any walking paths of anything that might make someone trip, such as rocks or tools. Regularly check to see if handrails are loose or broken. Make sure that both sides of any steps have handrails. Any raised decks and porches should have guardrails on the edges. Have any leaves, snow, or ice cleared regularly. Use sand or salt on walking paths during winter. Clean up any spills in your  garage right away. This includes oil or grease spills. What can I do in the bathroom? Use night lights. Install grab bars by the toilet and in the tub and shower. Do not use towel bars as grab bars. Use non-skid mats or decals in the tub or shower. If you need to sit down in the shower, use a plastic, non-slip stool. Keep the floor dry. Clean up any water that spills on the floor as soon as it happens. Remove soap buildup in the tub or shower regularly. Attach bath mats securely with double-sided non-slip rug tape. Do not have throw rugs and other things on the floor that can make you trip. What can I do in the bedroom? Use night lights. Make sure that you have a light by your bed that is easy to reach. Do not use any sheets or blankets that are too big for your bed. They should not hang down onto the floor. Have a firm chair that has side arms. You can use this for support while you get dressed. Do not have throw rugs and other things on the floor that can make you trip. What can I do in the kitchen? Clean up any spills right away. Avoid walking on wet floors. Keep items that you use a lot in easy-to-reach places. If you need to reach something above you, use a strong step stool that has a grab bar. Keep electrical cords out of the way. Do not  use floor polish or wax that makes floors slippery. If you must use wax, use non-skid floor wax. Do not have throw rugs and other things on the floor that can make you trip. What can I do with my stairs? Do not leave any items on the stairs. Make sure that there are handrails on both sides of the stairs and use them. Fix handrails that are broken or loose. Make sure that handrails are as long as the stairways. Check any carpeting to make sure that it is firmly attached to the stairs. Fix any carpet that is loose or worn. Avoid having throw rugs at the top or bottom of the stairs. If you do have throw rugs, attach them to the floor with carpet  tape. Make sure that you have a light switch at the top of the stairs and the bottom of the stairs. If you do not have them, ask someone to add them for you. What else can I do to help prevent falls? Wear shoes that: Do not have high heels. Have rubber bottoms. Are comfortable and fit you well. Are closed at the toe. Do not wear sandals. If you use a stepladder: Make sure that it is fully opened. Do not climb a closed stepladder. Make sure that both sides of the stepladder are locked into place. Ask someone to hold it for you, if possible. Clearly mark and make sure that you can see: Any grab bars or handrails. First and last steps. Where the edge of each step is. Use tools that help you move around (mobility aids) if they are needed. These include: Canes. Walkers. Scooters. Crutches. Turn on the lights when you go into a dark area. Replace any light bulbs as soon as they burn out. Set up your furniture so you have a clear path. Avoid moving your furniture around. If any of your floors are uneven, fix them. If there are any pets around you, be aware of where they are. Review your medicines with your doctor. Some medicines can make you feel dizzy. This can increase your chance of falling. Ask your doctor what other things that you can do to help prevent falls. This information is not intended to replace advice given to you by your health care provider. Make sure you discuss any questions you have with your health care provider. Document Released: 05/27/2009 Document Revised: 01/06/2016 Document Reviewed: 09/04/2014 Elsevier Interactive Patient Education  2017 [employer].

## 2022-05-22 NOTE — Telephone Encounter (Signed)
Who Is Calling Patient / Member / Family / Caregiver Caller Name [name] Phone Number [phone] Patient Name Joy [name] Patient DOB [birth year] Call Type Message Only Information Provided Reason for Call Request to Reschedule Office Appointment Initial Comment Caller needs to reschedule appointment. Patient request to speak to RN No Additional Comment Declined triage.

## 2022-05-22 NOTE — Telephone Encounter (Signed)
Patient wanted to reschedule her nurse visit.  Appt changed to [phone] on Wednesday.
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
## 2022-05-24 ENCOUNTER — Encounter (INDEPENDENT_AMBULATORY_CARE_PROVIDER_SITE_OTHER): Payer: Self-pay | Admitting: Family Medicine

## 2022-05-24 ENCOUNTER — Ambulatory Visit (INDEPENDENT_AMBULATORY_CARE_PROVIDER_SITE_OTHER): Payer: Medicare Other | Admitting: Family Medicine

## 2022-05-24 ENCOUNTER — Ambulatory Visit (INDEPENDENT_AMBULATORY_CARE_PROVIDER_SITE_OTHER): Payer: Medicare Other | Admitting: *Deleted

## 2022-05-24 VITALS — BP 110/77 | HR 90 | Temp 98.4°F | Ht 64.0 in | Wt 249.0 lb

## 2022-05-24 DIAGNOSIS — E538 Deficiency of other specified B group vitamins: Secondary | ICD-10-CM | POA: Diagnosis not present

## 2022-05-24 DIAGNOSIS — Z6841 Body Mass Index (BMI) 40.0 and over, adult: Secondary | ICD-10-CM | POA: Diagnosis not present

## 2022-05-24 DIAGNOSIS — E88819 Insulin resistance, unspecified: Secondary | ICD-10-CM

## 2022-05-24 DIAGNOSIS — E669 Obesity, unspecified: Secondary | ICD-10-CM | POA: Diagnosis not present

## 2022-05-24 MED ORDER — LIRAGLUTIDE 18 MG/3ML ~~LOC~~ SOPN: 1.8000 mg | PEN_INJECTOR | Freq: Every day | SUBCUTANEOUS | 0 refills | Status: DC

## 2022-05-24 MED ORDER — CYANOCOBALAMIN 1000 MCG/ML IJ SOLN
1000.0000 ug | Freq: Once | INTRAMUSCULAR | Status: AC
  Administered 2022-05-24: 1000 ug via INTRAMUSCULAR

## 2022-05-24 NOTE — Progress Notes (Signed)
Joy [name] is a 56 y.o. female presents to the office today for monthly B12 injection, per physician's orders. Original order: " 03/21/22 begin b12 1016mg IM once weekly for 4 weeks, then once monthly." Cyanocobalamin (med), 1000 mg/ml (dose),  IM administered L Deltoid (location) today. Patient tolerated injection. Patient due for follow up labs/provider appt: No.  Patient next injection due: 1 month, appt made Yes

## 2022-05-29 NOTE — Progress Notes (Signed)
Chief Complaint:   OBESITY Joy [name] is here to discuss her progress with her obesity treatment plan along with follow-up of her obesity related diagnoses. Joy [name] is on the [employer] and states she is following her eating plan approximately 100% of the time. Joy [name] states she is doing water aerobics/cycle/lifting weights 45/30/30 minutes 3/2/2 times per week.  Today's visit was #: 31 Starting weight: 268 lbs Starting date: 12/16/2019 Today's weight: 249 lbs Today's date: 05/24/2022 Total lbs lost to date: 19 lbs Total lbs lost since last in-office visit: 4  Interim History: Joy [name] brought meal plan with her today. She is fairly consistent with meal plan. No hunger, not anticipating any obstacles in upcoming few weeks. Choosing the same options for B/L/D frequently.  Subjective:   1. Insulin resistance Joy [name]'s last insulin level of 14.1, A1c 5.1. Denies GI side effects on Victoza.  2. Vitamin B12 deficiency Joy [name] is on B12 monthly injections. Notes fatigue. Her last B12 level of 88  Assessment/Plan:   1. Insulin resistance We will refill Victoza 1.8 mg SubQ daily for 1 month with 0 refills.  -Refill liraglutide (VICTOZA) 18 MG/3ML SOPN; Inject 1.8 mg into the skin daily.  Dispense: 9 mL; Refill: 0  2. Vitamin B12 deficiency Will follow up B12 level per PCP.  3. Obesity with current BMI of 42.9 Joy [name] is currently in the action stage of change. As such, her goal is to continue with weight loss efforts. She has agreed to the [employer].   Exercise goals: As is.   Behavioral modification strategies: increasing lean protein intake, meal planning and cooking strategies, keeping healthy foods in the home, and planning for success.  Joy [name] has agreed to follow-up with our clinic in 4 weeks. She was informed of the importance of frequent follow-up visits to maximize her success with intensive lifestyle modifications for her multiple health  conditions.   Objective:   Blood pressure 110/77, pulse 90, temperature 98.4 F (36.9 C), height '5\' 4"'$  (1.626 m), weight 249 lb (112.9 kg), SpO2 99 %. Body mass index is 42.74 kg/m.  General: Cooperative, alert, well developed, in no acute distress. HEENT: Conjunctivae and lids unremarkable. Cardiovascular: Regular rhythm.  Lungs: Normal work of breathing. Neurologic: No focal deficits.   Lab Results  Component Value Date   CREATININE 1.12 (H) 01/11/2022   BUN 11 01/11/2022   NA 143 01/11/2022   K 4.1 01/11/2022   CL 105 01/11/2022   CO2 26 01/11/2022   Lab Results  Component Value Date   ALT 8 01/11/2022   AST 19 01/11/2022   ALKPHOS 100 01/11/2022   BILITOT 0.4 01/11/2022   Lab Results  Component Value Date   HGBA1C 5.1 01/11/2022   HGBA1C 5.3 09/20/2021   HGBA1C 5.4 05/24/2021   HGBA1C 5.3 02/07/2021   HGBA1C 5.3 09/28/2020   Lab Results  Component Value Date   INSULIN 14.1 01/11/2022   INSULIN 12.6 05/24/2021   INSULIN 7.1 02/07/2021   INSULIN 10.9 09/28/2020   INSULIN 24.0 04/08/2020   Lab Results  Component Value Date   TSH 2.100 12/16/2019   Lab Results  Component Value Date   CHOL 177 01/11/2022   HDL 45 01/11/2022   LDLCALC 118 (H) 01/11/2022   TRIG 73 01/11/2022   CHOLHDL 3.7 02/07/2021   Lab Results  Component Value Date   VD25OH 50.4 01/11/2022   VD25OH 58.3 05/24/2021   VD25OH 42.1 02/07/2021   Lab Results  Component Value Date   WBC  6.5 12/16/2019   HGB 11.4 12/16/2019   HCT 35.2 12/16/2019   MCV 93 12/16/2019   PLT 277 12/16/2019   Lab Results  Component Value Date   IRON 46 11/25/2019   TIBC 252 09/26/2013   FERRITIN 242.6 11/25/2019   Attestation Statements:   Reviewed by clinician on day of visit: allergies, medications, problem list, medical history, surgical history, family history, social history, and previous encounter notes.  I, Elnora Morrison, RMA am acting as transcriptionist for Coralie Common, MD.  I  have reviewed the above documentation for accuracy and completeness, and I agree with the above. - Coralie Common, MD

## 2022-05-31 ENCOUNTER — Telehealth (HOSPITAL_BASED_OUTPATIENT_CLINIC_OR_DEPARTMENT_OTHER): Payer: Self-pay

## 2022-06-06 ENCOUNTER — Inpatient Hospital Stay (HOSPITAL_BASED_OUTPATIENT_CLINIC_OR_DEPARTMENT_OTHER): Admission: RE | Admit: 2022-06-06 | Payer: Medicare Other | Source: Ambulatory Visit

## 2022-06-06 ENCOUNTER — Ambulatory Visit (HOSPITAL_BASED_OUTPATIENT_CLINIC_OR_DEPARTMENT_OTHER)
Admission: RE | Admit: 2022-06-06 | Discharge: 2022-06-06 | Disposition: A | Discharge status: Home / Self Care | Payer: Medicare Other | Source: Ambulatory Visit | Attending: Family | Admitting: Family

## 2022-06-06 ENCOUNTER — Encounter (HOSPITAL_BASED_OUTPATIENT_CLINIC_OR_DEPARTMENT_OTHER): Payer: Self-pay

## 2022-06-06 DIAGNOSIS — Z1231 Encounter for screening mammogram for malignant neoplasm of breast: Secondary | ICD-10-CM | POA: Insufficient documentation

## 2022-06-06 DIAGNOSIS — M170 Bilateral primary osteoarthritis of knee: Secondary | ICD-10-CM | POA: Diagnosis not present

## 2022-06-06 DIAGNOSIS — M0609 Rheumatoid arthritis without rheumatoid factor, multiple sites: Secondary | ICD-10-CM | POA: Diagnosis not present

## 2022-06-06 DIAGNOSIS — S83231D Complex tear of medial meniscus, current injury, right knee, subsequent encounter: Secondary | ICD-10-CM | POA: Diagnosis not present

## 2022-06-06 DIAGNOSIS — M159 Polyosteoarthritis, unspecified: Secondary | ICD-10-CM | POA: Diagnosis not present

## 2022-06-06 DIAGNOSIS — Z79899 Other long term (current) drug therapy: Secondary | ICD-10-CM | POA: Diagnosis not present

## 2022-06-20 ENCOUNTER — Encounter: Payer: Self-pay | Admitting: Family

## 2022-06-21 ENCOUNTER — Other Ambulatory Visit: Payer: Self-pay

## 2022-06-21 ENCOUNTER — Encounter (HOSPITAL_COMMUNITY): Payer: Self-pay

## 2022-06-21 ENCOUNTER — Emergency Department (HOSPITAL_BASED_OUTPATIENT_CLINIC_OR_DEPARTMENT_OTHER): Payer: Medicare Other

## 2022-06-21 ENCOUNTER — Encounter (HOSPITAL_BASED_OUTPATIENT_CLINIC_OR_DEPARTMENT_OTHER): Payer: Self-pay | Admitting: Pediatrics

## 2022-06-21 ENCOUNTER — Inpatient Hospital Stay (HOSPITAL_BASED_OUTPATIENT_CLINIC_OR_DEPARTMENT_OTHER)
Admission: EM | Admit: 2022-06-21 | Discharge: 2022-06-26 | DRG: 391 | Disposition: A | Discharge status: Home / Self Care | Payer: Medicare Other | Attending: Internal Medicine | Admitting: Internal Medicine

## 2022-06-21 DIAGNOSIS — K760 Fatty (change of) liver, not elsewhere classified: Secondary | ICD-10-CM | POA: Diagnosis present

## 2022-06-21 DIAGNOSIS — R7303 Prediabetes: Secondary | ICD-10-CM | POA: Diagnosis present

## 2022-06-21 DIAGNOSIS — K529 Noninfective gastroenteritis and colitis, unspecified: Secondary | ICD-10-CM

## 2022-06-21 DIAGNOSIS — Z820 Family history of epilepsy and other diseases of the nervous system: Secondary | ICD-10-CM

## 2022-06-21 DIAGNOSIS — Z8661 Personal history of infections of the central nervous system: Secondary | ICD-10-CM

## 2022-06-21 DIAGNOSIS — Z90710 Acquired absence of both cervix and uterus: Secondary | ICD-10-CM

## 2022-06-21 DIAGNOSIS — Z7985 Long-term (current) use of injectable non-insulin antidiabetic drugs: Secondary | ICD-10-CM

## 2022-06-21 DIAGNOSIS — E860 Dehydration: Secondary | ICD-10-CM | POA: Diagnosis not present

## 2022-06-21 DIAGNOSIS — R112 Nausea with vomiting, unspecified: Secondary | ICD-10-CM | POA: Diagnosis not present

## 2022-06-21 DIAGNOSIS — Z6841 Body Mass Index (BMI) 40.0 and over, adult: Secondary | ICD-10-CM

## 2022-06-21 DIAGNOSIS — N179 Acute kidney failure, unspecified: Secondary | ICD-10-CM | POA: Diagnosis present

## 2022-06-21 DIAGNOSIS — M1990 Unspecified osteoarthritis, unspecified site: Secondary | ICD-10-CM | POA: Diagnosis not present

## 2022-06-21 DIAGNOSIS — Z833 Family history of diabetes mellitus: Secondary | ICD-10-CM

## 2022-06-21 DIAGNOSIS — N182 Chronic kidney disease, stage 2 (mild): Secondary | ICD-10-CM | POA: Diagnosis present

## 2022-06-21 DIAGNOSIS — I10 Essential (primary) hypertension: Secondary | ICD-10-CM | POA: Diagnosis not present

## 2022-06-21 DIAGNOSIS — R197 Diarrhea, unspecified: Secondary | ICD-10-CM | POA: Diagnosis present

## 2022-06-21 DIAGNOSIS — R000 Tachycardia, unspecified: Secondary | ICD-10-CM | POA: Diagnosis not present

## 2022-06-21 DIAGNOSIS — Z79899 Other long term (current) drug therapy: Secondary | ICD-10-CM | POA: Diagnosis not present

## 2022-06-21 DIAGNOSIS — A084 Viral intestinal infection, unspecified: Secondary | ICD-10-CM | POA: Diagnosis not present

## 2022-06-21 DIAGNOSIS — Z800 Family history of malignant neoplasm of digestive organs: Secondary | ICD-10-CM | POA: Diagnosis not present

## 2022-06-21 DIAGNOSIS — Z823 Family history of stroke: Secondary | ICD-10-CM | POA: Diagnosis not present

## 2022-06-21 DIAGNOSIS — Z8041 Family history of malignant neoplasm of ovary: Secondary | ICD-10-CM | POA: Diagnosis not present

## 2022-06-21 DIAGNOSIS — E7800 Pure hypercholesterolemia, unspecified: Secondary | ICD-10-CM | POA: Diagnosis present

## 2022-06-21 DIAGNOSIS — E538 Deficiency of other specified B group vitamins: Secondary | ICD-10-CM | POA: Diagnosis not present

## 2022-06-21 DIAGNOSIS — I129 Hypertensive chronic kidney disease with stage 1 through stage 4 chronic kidney disease, or unspecified chronic kidney disease: Secondary | ICD-10-CM | POA: Diagnosis present

## 2022-06-21 DIAGNOSIS — Z821 Family history of blindness and visual loss: Secondary | ICD-10-CM

## 2022-06-21 DIAGNOSIS — Z83511 Family history of glaucoma: Secondary | ICD-10-CM

## 2022-06-21 DIAGNOSIS — R109 Unspecified abdominal pain: Secondary | ICD-10-CM | POA: Diagnosis not present

## 2022-06-21 DIAGNOSIS — N170 Acute kidney failure with tubular necrosis: Secondary | ICD-10-CM | POA: Diagnosis not present

## 2022-06-21 DIAGNOSIS — M069 Rheumatoid arthritis, unspecified: Secondary | ICD-10-CM | POA: Diagnosis present

## 2022-06-21 DIAGNOSIS — J4540 Moderate persistent asthma, uncomplicated: Secondary | ICD-10-CM | POA: Diagnosis not present

## 2022-06-21 DIAGNOSIS — R079 Chest pain, unspecified: Secondary | ICD-10-CM | POA: Diagnosis not present

## 2022-06-21 DIAGNOSIS — R1110 Vomiting, unspecified: Secondary | ICD-10-CM | POA: Diagnosis not present

## 2022-06-21 LAB — COMPREHENSIVE METABOLIC PANEL
ALT: 21 U/L (ref 0–44)
AST: 38 U/L (ref 15–41)
Albumin: 4 g/dL (ref 3.5–5.0)
Alkaline Phosphatase: 78 U/L (ref 38–126)
Anion gap: 14 (ref 5–15)
BUN: 23 mg/dL — ABNORMAL HIGH (ref 6–20)
CO2: 23 mmol/L (ref 22–32)
Calcium: 8.9 mg/dL (ref 8.9–10.3)
Chloride: 101 mmol/L (ref 98–111)
Creatinine, Ser: 2.71 mg/dL — ABNORMAL HIGH (ref 0.44–1.00)
GFR, Estimated: 20 mL/min — ABNORMAL LOW (ref >60)
Glucose, Bld: 131 mg/dL — ABNORMAL HIGH (ref 70–99)
Potassium: 3.6 mmol/L (ref 3.5–5.1)
Sodium: 138 mmol/L (ref 135–145)
Total Bilirubin: 0.6 mg/dL (ref 0.3–1.2)
Total Protein: 8.6 g/dL — ABNORMAL HIGH (ref 6.5–8.1)

## 2022-06-21 LAB — CBC
HCT: 42.4 % (ref 36.0–46.0)
Hemoglobin: 13.6 g/dL (ref 12.0–15.0)
MCH: 29.1 pg (ref 26.0–34.0)
MCHC: 32.1 g/dL (ref 30.0–36.0)
MCV: 90.6 fL (ref 80.0–100.0)
Platelets: 334 10*3/uL (ref 150–400)
RBC: 4.68 MIL/uL (ref 3.87–5.11)
RDW: 12.9 % (ref 11.5–15.5)
WBC: 7.5 10*3/uL (ref 4.0–10.5)
nRBC: 0 % (ref 0.0–0.2)

## 2022-06-21 LAB — URINALYSIS, MICROSCOPIC (REFLEX)

## 2022-06-21 LAB — URINALYSIS, ROUTINE W REFLEX MICROSCOPIC
Bilirubin Urine: NEGATIVE
Glucose, UA: NEGATIVE mg/dL
Ketones, ur: NEGATIVE mg/dL
Leukocytes,Ua: NEGATIVE
Nitrite: NEGATIVE
Protein, ur: 30 mg/dL — AB
Specific Gravity, Urine: 1.01 (ref 1.005–1.030)
pH: 5.5 (ref 5.0–8.0)

## 2022-06-21 LAB — LIPASE, BLOOD: Lipase: 29 U/L (ref 11–51)

## 2022-06-21 LAB — TROPONIN I (HIGH SENSITIVITY): Troponin I (High Sensitivity): 5 ng/L (ref <18)

## 2022-06-21 MED ORDER — SODIUM CHLORIDE 0.9 % IV BOLUS
1000.0000 mL | Freq: Once | INTRAVENOUS | Status: AC
  Administered 2022-06-21: 1000 mL via INTRAVENOUS

## 2022-06-21 MED ORDER — ONDANSETRON HCL 4 MG/2ML IJ SOLN
4.0000 mg | Freq: Once | INTRAMUSCULAR | Status: AC
  Administered 2022-06-21: 4 mg via INTRAVENOUS
  Filled 2022-06-21: qty 2

## 2022-06-21 MED ORDER — SODIUM CHLORIDE 0.9 % IV SOLN
Freq: Once | INTRAVENOUS | Status: AC
  Administered 2022-06-21: 200 mL/h via INTRAVENOUS

## 2022-06-21 MED ORDER — IOHEXOL 300 MG/ML  SOLN
100.0000 mL | Freq: Once | INTRAMUSCULAR | Status: AC | PRN
  Administered 2022-06-21: 100 mL via INTRAVENOUS

## 2022-06-21 NOTE — Telephone Encounter (Signed)
Nurse Assessment Nurse: Lenon Curt, RN, Melanie Date/Time Eilene Ghazi Time): 06/21/2022 4:31:24 PM Confirm and document reason for call. If symptomatic, describe symptoms. ---Caller states haven't eaten since Sunday night. Unable to keep anything down. Has upper abdomen pain radiating into back when getting up. When goes to the bathroom starts to sweat and feel lightheaded. Kayopectate just taken. Diarrhea x 3 Does the patient have any new or worsening symptoms? ---Yes Will a triage be completed? ---Yes Related visit to physician within the last 2 weeks? ---No Does the PT have any chronic conditions? (i.e. diabetes, asthma, this includes High risk factors for pregnancy, etc.) ---Yes List chronic conditions. ---Unable to take since Sunday, not checking BG or BP Is this a behavioral health or substance abuse call? ---No Guidelines Guideline Title Affirmed Question Affirmed Notes Nurse Date/Time Eilene Ghazi Time) Vomiting High-risk adult (e.g., diabetes mellitus, Lenon Curt, RN, Threasa Beards 06/21/2022 4:34:01 PM PLEASE NOTE: All timestamps contained within this report are represented as [place] Standard Time. CONFIDENTIALTY NOTICE: This fax transmission is intended only for the addressee. It contains information that is legally privileged, confidential or otherwise protected from use or disclosure. If you are not the intended recipient, you are strictly prohibited from reviewing, disclosing, copying using or disseminating any of this information or taking any action in reliance on or regarding this information. If you have received this fax in error, please notify us immediately by telephone so that we can arrange for its return to [name]. Phone: [phone], Toll-Free: [phone], Fax: [phone] Page: 2 of 2 Call Id: [id number] Guidelines Guideline Title Affirmed Question Affirmed Notes Nurse Date/Time Eilene Ghazi Time) brain tumor, V-P shunt, hernia) Disp. Time Eilene Ghazi Time) Disposition Final  User 06/21/2022 4:29:17 PM Send to Urgent [place] 06/21/2022 4:36:23 PM Go to ED Now (or PCP triage) Yes Lenon Curt, RN, Melanie Final Disposition 06/21/2022 4:36:23 PM Go to ED Now (or PCP triage) Yes Lenon Curt, RN, Donnajean Lopes Disagree/Comply Comply Caller Understands Yes PreDisposition Call Doctor Care Advice Given Per Guideline GO TO ED NOW (OR PCP TRIAGE): CARE ADVICE per Vomiting (Adult) guideline. * IF NO PCP (PRIMARY CARE PROVIDER) SECOND-LEVEL TRIAGE: You need to be seen within the next hour. Go to the [facility] at _____________ [place] as soon as you can. Comments User: Erik Obey, RN Date/Time Eilene Ghazi Time): 06/21/2022 4:36:50 PM Dtr is on the way to caller's house. Referrals [facility] - ED

## 2022-06-21 NOTE — ED Notes (Signed)
Pt has returned to their room 

## 2022-06-21 NOTE — ED Notes (Signed)
Patient transported to CT 

## 2022-06-21 NOTE — ED Provider Notes (Signed)
[facility] EMERGENCY DEPARTMENT Provider Note   CSN: [id number] Arrival date & time: 06/21/22  1729     History  Chief Complaint  Patient presents with   Abdominal Pain   Diarrhea    Joy [name] is a 56 y.o. female history of rheumatoid arthritis on Plaquenil, presenting with abdominal pain and diarrhea.  Patient states that for the last 3 to 4 days, she has been having abdominal cramps.  She states that she has been having diarrhea as well.  She also has some vomiting.  She feels lightheaded and dizzy and weak so came here for further evaluation.  Patient was noted to be tachycardic and hypotensive in triage was brought back to the ED.  The history is provided by the patient.       Home Medications Prior to Admission medications   Medication Sig Start Date End Date Taking? Authorizing Provider  albuterol (VENTOLIN HFA) 108 (90 Base) MCG/ACT inhaler INHALE 2 INHALATIONS BY MOUTH EVERY 4 HOURS AS NEEDED FOR WHEEZING OR SHORTNESS OF BREATH 01/01/22   Debbrah Alar, NP  atorvastatin (LIPITOR) 20 MG tablet TAKE 1 TABLET(20 MG) BY MOUTH DAILY 09/20/21   Debbrah Alar, NP  betamethasone dipropionate 0.05 % cream Apply topically 2 (two) times daily. 03/22/22   Debbrah Alar, NP  cetirizine (ZYRTEC ALLERGY) 10 MG tablet Take 1 tablet (10 mg total) by mouth daily. 11/15/16   Julianne Rice, MD  Cholecalciferol (VITAMIN D3) 125 MCG (5000 UT) CAPS Take 1 capsule (5,000 Units total) by mouth daily. 02/07/21   Abby Potash, PA-C  cyclobenzaprine (FLEXERIL) 5 MG tablet Take 1 tablet (5 mg total) by mouth 2 (two) times daily as needed for muscle spasms. 07/12/20   Horton, Barbette Hair, MD  fluticasone (FLONASE) 50 MCG/ACT nasal spray Place 2 sprays into both nostrils daily. 11/15/16   Julianne Rice, MD  Fluticasone Propionate, Inhal, (FLOVENT DISKUS) 50 MCG/ACT AEPB Inhale 2 puffs into the lungs 2 (two) times daily. 50 mcg 03/27/22   Debbrah Alar, NP   hydroxychloroquine (PLAQUENIL) 200 MG tablet Take by mouth daily.    [provider]  Insulin Pen Needle (BD PEN NEEDLE NANO 2ND GEN) 32G X 4 MM MISC Use 1 needle daily to inject Victoza. 04/05/22   Laqueta Linden, MD  leflunomide (ARAVA) 20 MG tablet Take 20 mg by mouth daily.    [provider]  liraglutide (VICTOZA) 18 MG/3ML SOPN Inject 1.8 mg into the skin daily. 05/24/22   Laqueta Linden, MD  lisinopril (ZESTRIL) 5 MG tablet TAKE ONE TABLET BY MOUTH DAILY AT 9 AM 11/28/21   Debbrah Alar, NP  Multiple Vitamins-Minerals (WOMENS MULTIVITAMIN PO) Take by mouth.    [provider]  traMADol (ULTRAM) 50 MG tablet Take 50 mg by mouth every 6 (six) hours as needed. 07/19/20   [provider]      Allergies    Patient has no known allergies.    Review of Systems   Review of Systems  Gastrointestinal:  Positive for abdominal pain and diarrhea.  All other systems reviewed and are negative.   Physical Exam Updated Vital Signs BP 115/74   Pulse (!) 101   Temp (!) 97.5 F (36.4 C) (Oral)   Resp 17   Ht '5\' 4"'$  (1.626 m)   Wt 115.2 kg   SpO2 95%   BMI 43.60 kg/m  Physical Exam Vitals and nursing note reviewed.  Constitutional:      Comments: Dehydrated, uncomfortable  HENT:  Head: Normocephalic.     Mouth/Throat:     Pharynx: Oropharynx is clear.  Eyes:     Extraocular Movements: Extraocular movements intact.     Pupils: Pupils are equal, round, and reactive to light.  Cardiovascular:     Rate and Rhythm: Regular rhythm. Tachycardia present.  Pulmonary:     Effort: Pulmonary effort is normal.     Breath sounds: Normal breath sounds.  Abdominal:     General: Abdomen is flat.     Comments: + Epigastric tenderness  Skin:    General: Skin is warm.     Capillary Refill: Capillary refill takes less than 2 seconds.  Neurological:     General: No focal deficit present.     Mental Status: She is alert and oriented to person,  place, and time.  Psychiatric:        Mood and Affect: Mood normal.        Behavior: Behavior normal.     ED Results / Procedures / Treatments   Labs (all labs ordered are listed, but only abnormal results are displayed) Labs Reviewed  COMPREHENSIVE METABOLIC PANEL - Abnormal; Notable for the following components:      Result Value   Glucose, Bld 131 (*)    BUN 23 (*)    Creatinine, Ser 2.71 (*)    Total Protein 8.6 (*)    GFR, Estimated 20 (*)    All other components within normal limits  URINALYSIS, ROUTINE W REFLEX MICROSCOPIC - Abnormal; Notable for the following components:   Hgb urine dipstick MODERATE (*)    Protein, ur 30 (*)    All other components within normal limits  URINALYSIS, MICROSCOPIC (REFLEX) - Abnormal; Notable for the following components:   Bacteria, UA MANY (*)    All other components within normal limits  GASTROINTESTINAL PANEL BY PCR, STOOL (REPLACES STOOL CULTURE)  C DIFFICILE QUICK SCREEN W PCR REFLEX    LIPASE, BLOOD  CBC  TROPONIN I (HIGH SENSITIVITY)    EKG EKG Interpretation  Date/Time:  Wednesday June 21 2022 18:23:37 EST Ventricular Rate:  103 PR Interval:  125 QRS Duration: 79 QT Interval:  414 QTC Calculation: 542 R Axis:   -14 Text Interpretation: Sinus tachycardia Borderline abnrm T, anterolateral leads Prolonged QT interval No significant change since last tracing Confirmed by Wandra Arthurs [phone]) on 06/21/2022 6:26:53 PM  Radiology CT ABDOMEN PELVIS W CONTRAST  Result Date: 06/21/2022 CLINICAL DATA:  Abdominal pain. EXAM: CT ABDOMEN AND PELVIS WITH CONTRAST TECHNIQUE: Multidetector CT imaging of the abdomen and pelvis was performed using the standard protocol following bolus administration of intravenous contrast. RADIATION DOSE REDUCTION: This exam was performed according to the departmental dose-optimization program which includes automated exposure control, adjustment of the mA and/or kV according to patient size and/or use  of iterative reconstruction technique. CONTRAST:  166m OMNIPAQUE IOHEXOL 300 MG/ML  SOLN COMPARISON:  CT abdomen pelvis dated 12/30/2014. FINDINGS: Lower chest: The visualized lung bases are clear. No intra-abdominal free air or free fluid. Hepatobiliary: Mild fatty liver. Subcentimeter hypodense focus in the dome of the liver is too small to characterize. No biliary dilatation. The gallbladder is unremarkable. Pancreas: Unremarkable. No pancreatic ductal dilatation or surrounding inflammatory changes. Spleen: Normal in size without focal abnormality. Adrenals/Urinary Tract: The adrenal glands unremarkable. Subcentimeter hypodense lesion in the upper pole of the left kidney is too small to characterize. There is no hydronephrosis on either side. The visualized ureters and urinary bladder appear unremarkable. Stomach/Bowel: Mild diffuse  thickened appearance of the small bowel loops with mild engorgement of the associated mesentery most consistent with enteritis. Clinical correlation recommended. No bowel obstruction. There is loose stool throughout the colon consistent with diarrheal state. Correlation with clinical exam and stool cultures recommended. The appendix is normal. Vascular/Lymphatic: The abdominal aorta and IVC are unremarkable. No portal venous gas. There is no adenopathy. Reproductive: Hysterectomy.  No adnexal masses. Other: None Musculoskeletal: Degenerative changes of the spine. No acute osseous pathology. IMPRESSION: 1. Enteritis with diarrheal state. No bowel obstruction. Normal appendix. 2. Mild fatty liver. Electronically Signed   By: Anner Crete M.D.   On: 06/21/2022 19:41   DG Chest Port 1 View  Result Date: 06/21/2022 CLINICAL DATA:  Abdominal pain vomiting and diarrhea EXAM: PORTABLE CHEST 1 VIEW COMPARISON:  Radiographs 11/02/2020 FINDINGS: The heart size and mediastinal contours are within normal limits. Both lungs are clear. The visualized skeletal structures are unremarkable.  IMPRESSION: No active disease. Electronically Signed   By: Placido Sou M.D.   On: 06/21/2022 19:39    Procedures Procedures    Medications Ordered in ED Medications  sodium chloride 0.9 % bolus 1,000 mL (0 mLs Intravenous Stopped 06/21/22 1935)  ondansetron (ZOFRAN) injection 4 mg (4 mg Intravenous Given 06/21/22 1813)  iohexol (OMNIPAQUE) 300 MG/ML solution 100 mL (100 mLs Intravenous Contrast Given 06/21/22 1928)  sodium chloride 0.9 % bolus 1,000 mL (0 mLs Intravenous Stopped 06/21/22 2231)  0.9 %  sodium chloride infusion (200 mL/hr Intravenous New Bag/Given 06/21/22 2234)    ED Course/ Medical Decision Making/ A&P                           Medical Decision Making NAKEMA FAKE is a 55 y.o. female here presenting with abdominal pain and hypotension.  She appears dehydrated.  We will get CT abdomen pelvis to rule out obstruction versus enteritis.  We will also get CBC and CMP and lipase and urinalysis.  We will hydrate patient and reassess.  9 PM Patient has acute renal failure with creatinine of 2.7.  Patient CT abdomen pelvis showed enteritis.  Patient white blood cell count is normal.  I ordered C. difficile and GI pathogen panel.  I held off on antibiotics for now.  Patient will be admitted for observation for acute renal failure likely from gastroenteritis.   Problems Addressed: AKI (acute kidney injury) ([place]): acute illness or injury Gastroenteritis: acute illness or injury  Amount and/or Complexity of Data Reviewed Labs: ordered. Decision-making details documented in ED Course. Radiology: ordered and independent interpretation performed. Decision-making details documented in ED Course. ECG/medicine tests: ordered.  Risk Prescription drug management. Decision regarding hospitalization.    Final Clinical Impression(s) / ED Diagnoses Final diagnoses:  AKI (acute kidney injury) ([place])  Gastroenteritis    Rx / DC Orders ED Discharge Orders     None          Drenda Freeze, MD 06/21/22 2333

## 2022-06-21 NOTE — Telephone Encounter (Signed)
Pt called stating that she needed to make an appt. After speaking with her on her symptoms she stated that she had not eaten anything since 11.5.23. Made appt with Mickel Baas on 11.10.23 to access and transferred her to triage nurse for further eval.

## 2022-06-21 NOTE — ED Triage Notes (Signed)
C/o abdominal pain N/V and diarrhea since Sunday;

## 2022-06-22 ENCOUNTER — Encounter (HOSPITAL_COMMUNITY): Payer: Self-pay | Admitting: Family Medicine

## 2022-06-22 DIAGNOSIS — Z83511 Family history of glaucoma: Secondary | ICD-10-CM | POA: Diagnosis not present

## 2022-06-22 DIAGNOSIS — Z79899 Other long term (current) drug therapy: Secondary | ICD-10-CM | POA: Diagnosis not present

## 2022-06-22 DIAGNOSIS — N179 Acute kidney failure, unspecified: Secondary | ICD-10-CM | POA: Diagnosis not present

## 2022-06-22 DIAGNOSIS — R112 Nausea with vomiting, unspecified: Secondary | ICD-10-CM

## 2022-06-22 DIAGNOSIS — I129 Hypertensive chronic kidney disease with stage 1 through stage 4 chronic kidney disease, or unspecified chronic kidney disease: Secondary | ICD-10-CM | POA: Diagnosis not present

## 2022-06-22 DIAGNOSIS — I10 Essential (primary) hypertension: Secondary | ICD-10-CM

## 2022-06-22 DIAGNOSIS — N170 Acute kidney failure with tubular necrosis: Secondary | ICD-10-CM | POA: Diagnosis not present

## 2022-06-22 DIAGNOSIS — Z90710 Acquired absence of both cervix and uterus: Secondary | ICD-10-CM | POA: Diagnosis not present

## 2022-06-22 DIAGNOSIS — Z800 Family history of malignant neoplasm of digestive organs: Secondary | ICD-10-CM | POA: Diagnosis not present

## 2022-06-22 DIAGNOSIS — R7303 Prediabetes: Secondary | ICD-10-CM | POA: Diagnosis not present

## 2022-06-22 DIAGNOSIS — Z8041 Family history of malignant neoplasm of ovary: Secondary | ICD-10-CM | POA: Diagnosis not present

## 2022-06-22 DIAGNOSIS — Z6841 Body Mass Index (BMI) 40.0 and over, adult: Secondary | ICD-10-CM | POA: Diagnosis not present

## 2022-06-22 DIAGNOSIS — E7800 Pure hypercholesterolemia, unspecified: Secondary | ICD-10-CM | POA: Diagnosis not present

## 2022-06-22 DIAGNOSIS — Z833 Family history of diabetes mellitus: Secondary | ICD-10-CM | POA: Diagnosis not present

## 2022-06-22 DIAGNOSIS — A084 Viral intestinal infection, unspecified: Secondary | ICD-10-CM | POA: Diagnosis not present

## 2022-06-22 DIAGNOSIS — J4540 Moderate persistent asthma, uncomplicated: Secondary | ICD-10-CM

## 2022-06-22 DIAGNOSIS — K529 Noninfective gastroenteritis and colitis, unspecified: Secondary | ICD-10-CM | POA: Diagnosis present

## 2022-06-22 DIAGNOSIS — M069 Rheumatoid arthritis, unspecified: Secondary | ICD-10-CM

## 2022-06-22 DIAGNOSIS — K760 Fatty (change of) liver, not elsewhere classified: Secondary | ICD-10-CM | POA: Diagnosis not present

## 2022-06-22 DIAGNOSIS — M1990 Unspecified osteoarthritis, unspecified site: Secondary | ICD-10-CM | POA: Diagnosis not present

## 2022-06-22 DIAGNOSIS — E860 Dehydration: Secondary | ICD-10-CM | POA: Diagnosis not present

## 2022-06-22 DIAGNOSIS — Z823 Family history of stroke: Secondary | ICD-10-CM | POA: Diagnosis not present

## 2022-06-22 DIAGNOSIS — E538 Deficiency of other specified B group vitamins: Secondary | ICD-10-CM | POA: Diagnosis not present

## 2022-06-22 DIAGNOSIS — Z821 Family history of blindness and visual loss: Secondary | ICD-10-CM | POA: Diagnosis not present

## 2022-06-22 DIAGNOSIS — N182 Chronic kidney disease, stage 2 (mild): Secondary | ICD-10-CM | POA: Diagnosis not present

## 2022-06-22 DIAGNOSIS — Z8661 Personal history of infections of the central nervous system: Secondary | ICD-10-CM | POA: Diagnosis not present

## 2022-06-22 DIAGNOSIS — R197 Diarrhea, unspecified: Secondary | ICD-10-CM

## 2022-06-22 LAB — GASTROINTESTINAL PANEL BY PCR, STOOL (REPLACES STOOL CULTURE)

## 2022-06-22 LAB — C DIFFICILE QUICK SCREEN W PCR REFLEX
C Diff antigen: NEGATIVE
C Diff interpretation: NOT DETECTED
C Diff toxin: NEGATIVE

## 2022-06-22 LAB — HIV ANTIBODY (ROUTINE TESTING W REFLEX): HIV Screen 4th Generation wRfx: NONREACTIVE

## 2022-06-22 MED ORDER — LACTATED RINGERS IV SOLN: INTRAVENOUS | Status: DC

## 2022-06-22 MED ORDER — BUDESONIDE 0.25 MG/2ML IN SUSP
2.0000 mL | Freq: Two times a day (BID) | RESPIRATORY_TRACT | Status: DC
  Administered 2022-06-23 – 2022-06-26 (×6): 0.25 mg via RESPIRATORY_TRACT
  Filled 2022-06-22 (×6): qty 2

## 2022-06-22 MED ORDER — ONDANSETRON HCL 4 MG PO TABS
4.0000 mg | ORAL_TABLET | Freq: Four times a day (QID) | ORAL | Status: DC | PRN
  Filled 2022-06-22: qty 1

## 2022-06-22 MED ORDER — ACETAMINOPHEN 325 MG PO TABS: 650.0000 mg | ORAL_TABLET | Freq: Four times a day (QID) | ORAL | Status: DC | PRN

## 2022-06-22 MED ORDER — HEPARIN SODIUM (PORCINE) 5000 UNIT/ML IJ SOLN
5000.0000 [IU] | Freq: Three times a day (TID) | INTRAMUSCULAR | Status: DC
  Administered 2022-06-22 – 2022-06-26 (×13): 5000 [IU] via SUBCUTANEOUS
  Filled 2022-06-22 (×13): qty 1

## 2022-06-22 MED ORDER — MORPHINE SULFATE (PF) 2 MG/ML IV SOLN
2.0000 mg | INTRAVENOUS | Status: DC | PRN
  Administered 2022-06-22 – 2022-06-23 (×4): 2 mg via INTRAVENOUS
  Filled 2022-06-22 (×4): qty 1

## 2022-06-22 MED ORDER — ONDANSETRON HCL 4 MG/2ML IJ SOLN
4.0000 mg | Freq: Four times a day (QID) | INTRAMUSCULAR | Status: DC | PRN
  Administered 2022-06-22 – 2022-06-23 (×3): 4 mg via INTRAVENOUS
  Filled 2022-06-22 (×4): qty 2

## 2022-06-22 MED ORDER — ACETAMINOPHEN 650 MG RE SUPP: 650.0000 mg | Freq: Four times a day (QID) | RECTAL | Status: DC | PRN

## 2022-06-22 MED ORDER — LOPERAMIDE HCL 2 MG PO CAPS
2.0000 mg | ORAL_CAPSULE | ORAL | Status: DC | PRN
  Administered 2022-06-22 – 2022-06-24 (×7): 2 mg via ORAL
  Filled 2022-06-22 (×7): qty 1

## 2022-06-22 MED ORDER — ALBUTEROL SULFATE (2.5 MG/3ML) 0.083% IN NEBU: 3.0000 mL | INHALATION_SOLUTION | RESPIRATORY_TRACT | Status: DC | PRN

## 2022-06-22 MED ORDER — ATORVASTATIN CALCIUM 10 MG PO TABS
20.0000 mg | ORAL_TABLET | Freq: Every evening | ORAL | Status: DC
  Administered 2022-06-22 – 2022-06-25 (×4): 20 mg via ORAL
  Filled 2022-06-22 (×4): qty 2

## 2022-06-22 NOTE — Assessment & Plan Note (Addendum)
CT showing enteritis, presumably infectious. GI pathogen pnl pending C.Diff was neg Nl WBC, tachycardia and hypotension improved post IVF ? Viral gastroenteritis

## 2022-06-22 NOTE — Assessment & Plan Note (Signed)
Likely pre-renal / ATN secondary to dehydration / hypotension from N/V/D IVF Strict intake and output No evidence of obstruction on CT today Daily BMP Hold home BP meds

## 2022-06-22 NOTE — Assessment & Plan Note (Signed)
Hold home BP meds due to soft BPs in ED and AKI.

## 2022-06-22 NOTE — Assessment & Plan Note (Signed)
Cont home inhalers when med rec is completed.

## 2022-06-22 NOTE — Assessment & Plan Note (Signed)
Med rec pending. Looks like previously on Plaquinel
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
## 2022-06-22 NOTE — Assessment & Plan Note (Signed)
Looks like patient takes victoza, presumably for this and for wt loss.

## 2022-06-22 NOTE — Progress Notes (Signed)
Mobility Specialist - Progress Note   06/22/22 1301  Mobility  Activity Ambulated independently in hallway  Level of Assistance Modified independent, requires aide device or extra time  Assistive Device None  Distance Ambulated (ft) 150 ft  Activity Response Tolerated well  Mobility Referral Yes  $Mobility charge 1 Mobility   Pt received in bed and agreed to mobility, some pain during session, claimed it was due to her sandwich for lunch. Distance limited to fatigue and pain, pt returned to bed with all needs met.   Roderick Pee Mobility Specialist

## 2022-06-22 NOTE — Telephone Encounter (Signed)
Patient was seen at ED

## 2022-06-22 NOTE — ED Notes (Signed)
[facility] at bedside 

## 2022-06-22 NOTE — Progress Notes (Signed)
  Transition of Care [facility]) Screening Note   Patient Details  Name: Joy [name] Date of Birth: 04-Sep-1965   Transition of Care [facility]) CM/SW Contact:    Vassie Moselle, LCSW Phone Number: 06/22/2022, 1:41 PM    Transition of Care Department [facility]) has reviewed patient and no TOC needs have been identified at this time. We will continue to monitor patient advancement through interdisciplinary progression rounds. If new patient transition needs arise, please place a TOC consult.

## 2022-06-22 NOTE — Progress Notes (Signed)
Patient was seen and examined.  56 year old patient with history of hypertension on lisinopril, prediabetes and morbid obesity on Victoza presented with 2 days of nausea vomiting and intractable diarrhea with dizziness lightheadedness.  In the emergency room initially low normal blood pressures responded to IV fluids, found to have acute kidney injury and moderate dehydration.  Nausea is better now.  Will advance to regular diet.  Continue maintenance IV fluids.  Intake output monitoring.  Recheck levels tomorrow morning.  C. difficile and GI pathogen panel was negative so she will be able to use Imodium as needed.  Mobilize around in the hallway.  Anticipate home tomorrow.   Same-day admit.  No charge visit.

## 2022-06-23 ENCOUNTER — Encounter (HOSPITAL_COMMUNITY): Payer: Self-pay | Admitting: Internal Medicine

## 2022-06-23 ENCOUNTER — Ambulatory Visit: Payer: Medicare Other | Admitting: Family

## 2022-06-23 DIAGNOSIS — Z83511 Family history of glaucoma: Secondary | ICD-10-CM | POA: Diagnosis not present

## 2022-06-23 DIAGNOSIS — Z90710 Acquired absence of both cervix and uterus: Secondary | ICD-10-CM | POA: Diagnosis not present

## 2022-06-23 DIAGNOSIS — M1990 Unspecified osteoarthritis, unspecified site: Secondary | ICD-10-CM | POA: Diagnosis present

## 2022-06-23 DIAGNOSIS — J4540 Moderate persistent asthma, uncomplicated: Secondary | ICD-10-CM | POA: Diagnosis present

## 2022-06-23 DIAGNOSIS — N170 Acute kidney failure with tubular necrosis: Secondary | ICD-10-CM | POA: Diagnosis present

## 2022-06-23 DIAGNOSIS — E669 Obesity, unspecified: Secondary | ICD-10-CM | POA: Diagnosis not present

## 2022-06-23 DIAGNOSIS — Z800 Family history of malignant neoplasm of digestive organs: Secondary | ICD-10-CM | POA: Diagnosis not present

## 2022-06-23 DIAGNOSIS — E538 Deficiency of other specified B group vitamins: Secondary | ICD-10-CM | POA: Diagnosis not present

## 2022-06-23 DIAGNOSIS — Z823 Family history of stroke: Secondary | ICD-10-CM | POA: Diagnosis not present

## 2022-06-23 DIAGNOSIS — K760 Fatty (change of) liver, not elsewhere classified: Secondary | ICD-10-CM | POA: Diagnosis present

## 2022-06-23 DIAGNOSIS — E860 Dehydration: Secondary | ICD-10-CM | POA: Diagnosis present

## 2022-06-23 DIAGNOSIS — K529 Noninfective gastroenteritis and colitis, unspecified: Secondary | ICD-10-CM | POA: Diagnosis present

## 2022-06-23 DIAGNOSIS — A084 Viral intestinal infection, unspecified: Secondary | ICD-10-CM | POA: Diagnosis present

## 2022-06-23 DIAGNOSIS — I129 Hypertensive chronic kidney disease with stage 1 through stage 4 chronic kidney disease, or unspecified chronic kidney disease: Secondary | ICD-10-CM | POA: Diagnosis present

## 2022-06-23 DIAGNOSIS — Z79899 Other long term (current) drug therapy: Secondary | ICD-10-CM | POA: Diagnosis not present

## 2022-06-23 DIAGNOSIS — Z821 Family history of blindness and visual loss: Secondary | ICD-10-CM | POA: Diagnosis not present

## 2022-06-23 DIAGNOSIS — M069 Rheumatoid arthritis, unspecified: Secondary | ICD-10-CM | POA: Diagnosis present

## 2022-06-23 DIAGNOSIS — Z8661 Personal history of infections of the central nervous system: Secondary | ICD-10-CM | POA: Diagnosis not present

## 2022-06-23 DIAGNOSIS — Z833 Family history of diabetes mellitus: Secondary | ICD-10-CM | POA: Diagnosis not present

## 2022-06-23 DIAGNOSIS — Z6841 Body Mass Index (BMI) 40.0 and over, adult: Secondary | ICD-10-CM | POA: Diagnosis not present

## 2022-06-23 DIAGNOSIS — Z8041 Family history of malignant neoplasm of ovary: Secondary | ICD-10-CM | POA: Diagnosis not present

## 2022-06-23 DIAGNOSIS — R7303 Prediabetes: Secondary | ICD-10-CM | POA: Diagnosis present

## 2022-06-23 DIAGNOSIS — N182 Chronic kidney disease, stage 2 (mild): Secondary | ICD-10-CM | POA: Diagnosis present

## 2022-06-23 DIAGNOSIS — E88819 Insulin resistance, unspecified: Secondary | ICD-10-CM | POA: Diagnosis not present

## 2022-06-23 DIAGNOSIS — N179 Acute kidney failure, unspecified: Secondary | ICD-10-CM | POA: Diagnosis not present

## 2022-06-23 DIAGNOSIS — E7800 Pure hypercholesterolemia, unspecified: Secondary | ICD-10-CM | POA: Diagnosis present

## 2022-06-23 LAB — BASIC METABOLIC PANEL
Anion gap: 8 (ref 5–15)
BUN: 15 mg/dL (ref 6–20)
CO2: 23 mmol/L (ref 22–32)
Calcium: 8.6 mg/dL — ABNORMAL LOW (ref 8.9–10.3)
Chloride: 106 mmol/L (ref 98–111)
Creatinine, Ser: 1.26 mg/dL — ABNORMAL HIGH (ref 0.44–1.00)
GFR, Estimated: 50 mL/min — ABNORMAL LOW (ref >60)
Glucose, Bld: 99 mg/dL (ref 70–99)
Potassium: 3.8 mmol/L (ref 3.5–5.1)
Sodium: 137 mmol/L (ref 135–145)

## 2022-06-23 MED ORDER — PROCHLORPERAZINE EDISYLATE 10 MG/2ML IJ SOLN
10.0000 mg | Freq: Four times a day (QID) | INTRAMUSCULAR | Status: DC | PRN
  Administered 2022-06-23 – 2022-06-24 (×2): 10 mg via INTRAVENOUS
  Filled 2022-06-23 (×2): qty 2

## 2022-06-23 NOTE — Progress Notes (Signed)
PROGRESS NOTE    Joy [name]  RFF:[id number] DOB: 1966/07/20 DOA: 06/21/2022 PCP: Debbrah Alar, NP    Brief Narrative:   56 year old patient with history of hypertension on lisinopril, prediabetes and morbid obesity on Victoza presented with 2 days of nausea vomiting and intractable diarrhea with dizziness lightheadedness.  In the emergency room initially low normal blood pressures responded to IV fluids, found to have acute kidney injury and moderate dehydration.   Admitted due to significant symptoms.   Still symptomatic.  CT scan with diffuse enteritis.   Assessment & Plan:   Severe gastroenteritis, suspect viral gastroenteritis with abdominal pain nausea and vomiting. Not tolerating regular diet, keep on sips of water and full liquid diet.  Continue IV fluid resuscitation.  Mobilize. C. difficile and GI pathogen panel were negative, can use Imodium.  Essential hypertension: Blood pressures low on presentation.  Hold losartan.  Acute kidney injury with acute tubular necrosis, moderate dehydration: Continue IV fluid.  Renal functions improving.  Urine output is adequate.  Recheck tomorrow morning.  Rheumatoid arthritis: Patient on Plaquenil and leflunomide.  Hold until diarrhea improves.   DVT prophylaxis: heparin injection 5,000 Units Start: 06/22/22 0715   Code Status: Full code Family Communication: None at the bedside Disposition Plan: Status is: Observation The patient will require care spanning > 2 midnights and should be moved to inpatient because: Persistent nausea, not tolerating oral intake     Consultants:  None  Procedures:  None  Antimicrobials:  None   Subjective: Patient seen and examined.  Tired.  Could not tolerate sandwich.  She is just sipping some liquids.  3 large volume loose stools overnight.  Mild abdominal discomfort persist.  Objective: Vitals:   06/22/22 1414 06/22/22 2124 06/23/22 0552 06/23/22 1041  BP: (!) 128/95  107/76 (!) 123/90   Pulse: (!) 108 (!) 106 (!) 104 (!) 101  Resp: '19 20 16 18  '$ Temp: 98.4 F (36.9 C) 98.8 F (37.1 C) 98.2 F (36.8 C)   TempSrc: Oral Oral Oral   SpO2: 97% 98% 99% 96%  Weight:      Height:        Intake/Output Summary (Last 24 hours) at 06/23/2022 1103 Last data filed at 06/23/2022 0900 Gross per 24 hour  Intake 2819.52 ml  Output --  Net 2819.52 ml   Filed Weights   06/21/22 1759  Weight: 115.2 kg    Examination:  General exam: Appears appropriately anxious.  Not in any distress. Respiratory system: Clear to auscultation. Respiratory effort normal.  No added sounds. Cardiovascular system: S1 & S2 heard, RRR.  No pedal edema. Gastrointestinal system: Soft.  Mildly tender mostly in the left lateral quadrants.  Bowel sound present.  No rigidity or guarding.   Central nervous system: Alert and oriented. No focal neurological deficits. Extremities: Symmetric 5 x 5 power. Skin: No rashes, lesions or ulcers Psychiatry: Anxious.    Data Reviewed: I have personally reviewed following labs and imaging studies  CBC: Recent Labs  Lab 06/21/22 1809  WBC 7.5  HGB 13.6  HCT 42.4  MCV 90.6  PLT 357   Basic Metabolic Panel: Recent Labs  Lab 06/21/22 1809 06/23/22 0447  NA 138 137  K 3.6 3.8  CL 101 106  CO2 23 23  GLUCOSE 131* 99  BUN 23* 15  CREATININE 2.71* 1.26*  CALCIUM 8.9 8.6*   GFR: Estimated Creatinine Clearance: 62.1 mL/min (A) (by C-G formula based on SCr of 1.26 mg/dL (H)). Liver Function Tests: Recent  Labs  Lab 06/21/22 1809  AST 38  ALT 21  ALKPHOS 78  BILITOT 0.6  PROT 8.6*  ALBUMIN 4.0   Recent Labs  Lab 06/21/22 1809  LIPASE 29   No results for input(s): "AMMONIA" in the last 168 hours. Coagulation Profile: No results for input(s): "INR", "PROTIME" in the last 168 hours. Cardiac Enzymes: No results for input(s): "CKTOTAL", "CKMB", "CKMBINDEX", "TROPONINI" in the last 168 hours. BNP (last 3 results) No results  for input(s): "PROBNP" in the last 8760 hours. HbA1C: No results for input(s): "HGBA1C" in the last 72 hours. CBG: No results for input(s): "GLUCAP" in the last 168 hours. Lipid Profile: No results for input(s): "CHOL", "HDL", "LDLCALC", "TRIG", "CHOLHDL", "LDLDIRECT" in the last 72 hours. Thyroid Function Tests: No results for input(s): "TSH", "T4TOTAL", "FREET4", "T3FREE", "THYROIDAB" in the last 72 hours. Anemia Panel: No results for input(s): "VITAMINB12", "FOLATE", "FERRITIN", "TIBC", "IRON", "RETICCTPCT" in the last 72 hours. Sepsis Labs: No results for input(s): "PROCALCITON", "LATICACIDVEN" in the last 168 hours.  Recent Results (from the past 240 hour(s))  Gastrointestinal Panel by PCR , Stool     Status: None   Collection Time: 06/22/22  1:20 AM   Specimen: Stool  Result Value Ref Range Status   Campylobacter species NOT DETECTED NOT DETECTED Final   Plesimonas shigelloides NOT DETECTED NOT DETECTED Final   Salmonella species NOT DETECTED NOT DETECTED Final   Yersinia enterocolitica NOT DETECTED NOT DETECTED Final   Vibrio species NOT DETECTED NOT DETECTED Final   Vibrio cholerae NOT DETECTED NOT DETECTED Final   Enteroaggregative E coli (EAEC) NOT DETECTED NOT DETECTED Final   Enteropathogenic E coli (EPEC) NOT DETECTED NOT DETECTED Final   Enterotoxigenic E coli (ETEC) NOT DETECTED NOT DETECTED Final   Shiga like toxin producing E coli (STEC) NOT DETECTED NOT DETECTED Final   Shigella/Enteroinvasive E coli (EIEC) NOT DETECTED NOT DETECTED Final   Cryptosporidium NOT DETECTED NOT DETECTED Final   Cyclospora cayetanensis NOT DETECTED NOT DETECTED Final   Entamoeba histolytica NOT DETECTED NOT DETECTED Final   Giardia lamblia NOT DETECTED NOT DETECTED Final   Adenovirus F40/41 NOT DETECTED NOT DETECTED Final   Astrovirus NOT DETECTED NOT DETECTED Final   Norovirus GI/GII NOT DETECTED NOT DETECTED Final   Rotavirus A NOT DETECTED NOT DETECTED Final   Sapovirus (I, II,  IV, and V) NOT DETECTED NOT DETECTED Final    Comment: Performed at [facility], [address] [place], [place] [address]  C Difficile Quick Screen w PCR reflex     Status: None   Collection Time: 06/22/22  1:20 AM   Specimen: Stool  Result Value Ref Range Status   C Diff antigen NEGATIVE NEGATIVE Final   C Diff toxin NEGATIVE NEGATIVE Final   C Diff interpretation No C. difficile detected.  Final    Comment: Performed at [facility], [place] [address], [place], [place] [address]         Radiology Studies: CT ABDOMEN PELVIS W CONTRAST  Result Date: 06/21/2022 CLINICAL DATA:  Abdominal pain. EXAM: CT ABDOMEN AND PELVIS WITH CONTRAST TECHNIQUE: Multidetector CT imaging of the abdomen and pelvis was performed using the standard protocol following bolus administration of intravenous contrast. RADIATION DOSE REDUCTION: This exam was performed according to the departmental dose-optimization program which includes automated exposure control, adjustment of the mA and/or kV according to patient size and/or use of iterative reconstruction technique. CONTRAST:  153m OMNIPAQUE IOHEXOL 300 MG/ML  SOLN COMPARISON:  CT  abdomen pelvis dated 12/30/2014. FINDINGS: Lower chest: The visualized lung bases are clear. No intra-abdominal free air or free fluid. Hepatobiliary: Mild fatty liver. Subcentimeter hypodense focus in the dome of the liver is too small to characterize. No biliary dilatation. The gallbladder is unremarkable. Pancreas: Unremarkable. No pancreatic ductal dilatation or surrounding inflammatory changes. Spleen: Normal in size without focal abnormality. Adrenals/Urinary Tract: The adrenal glands unremarkable. Subcentimeter hypodense lesion in the upper pole of the left kidney is too small to characterize. There is no hydronephrosis on either side. The visualized ureters and urinary bladder appear unremarkable. Stomach/Bowel: Mild diffuse thickened appearance of the small  bowel loops with mild engorgement of the associated mesentery most consistent with enteritis. Clinical correlation recommended. No bowel obstruction. There is loose stool throughout the colon consistent with diarrheal state. Correlation with clinical exam and stool cultures recommended. The appendix is normal. Vascular/Lymphatic: The abdominal aorta and IVC are unremarkable. No portal venous gas. There is no adenopathy. Reproductive: Hysterectomy.  No adnexal masses. Other: None Musculoskeletal: Degenerative changes of the spine. No acute osseous pathology. IMPRESSION: 1. Enteritis with diarrheal state. No bowel obstruction. Normal appendix. 2. Mild fatty liver. Electronically Signed   By: Anner Crete M.D.   On: 06/21/2022 19:41   DG Chest Port 1 View  Result Date: 06/21/2022 CLINICAL DATA:  Abdominal pain vomiting and diarrhea EXAM: PORTABLE CHEST 1 VIEW COMPARISON:  Radiographs 11/02/2020 FINDINGS: The heart size and mediastinal contours are within normal limits. Both lungs are clear. The visualized skeletal structures are unremarkable. IMPRESSION: No active disease. Electronically Signed   By: Placido Sou M.D.   On: 06/21/2022 19:39        Scheduled Meds:  atorvastatin  20 mg Oral QPM   budesonide  2 mL Inhalation BID   heparin  5,000 Units Subcutaneous Q8H   Continuous Infusions:  lactated ringers 125 mL/hr at 06/23/22 0622     LOS: 0 days    Time spent: 35 minutes    Barb Merino, MD [facility] Pager [phone]

## 2022-06-24 ENCOUNTER — Encounter (INDEPENDENT_AMBULATORY_CARE_PROVIDER_SITE_OTHER): Payer: Self-pay | Admitting: Family Medicine

## 2022-06-24 LAB — CBC WITH DIFFERENTIAL/PLATELET
Abs Immature Granulocytes: 0.03 10*3/uL (ref 0.00–0.07)
Basophils Absolute: 0 10*3/uL (ref 0.0–0.1)
Basophils Relative: 0 %
Eosinophils Absolute: 0.1 10*3/uL (ref 0.0–0.5)
Eosinophils Relative: 1 %
HCT: 30.5 % — ABNORMAL LOW (ref 36.0–46.0)
Hemoglobin: 9.6 g/dL — ABNORMAL LOW (ref 12.0–15.0)
Immature Granulocytes: 0 %
Lymphocytes Relative: 21 %
Lymphs Abs: 1.5 10*3/uL (ref 0.7–4.0)
MCH: 29.4 pg (ref 26.0–34.0)
MCHC: 31.5 g/dL (ref 30.0–36.0)
MCV: 93.6 fL (ref 80.0–100.0)
Monocytes Absolute: 0.8 10*3/uL (ref 0.1–1.0)
Monocytes Relative: 11 %
Neutro Abs: 4.5 10*3/uL (ref 1.7–7.7)
Neutrophils Relative %: 67 %
Platelets: 241 10*3/uL (ref 150–400)
RBC: 3.26 MIL/uL — ABNORMAL LOW (ref 3.87–5.11)
RDW: 12.7 % (ref 11.5–15.5)
WBC: 6.9 10*3/uL (ref 4.0–10.5)
nRBC: 0 % (ref 0.0–0.2)

## 2022-06-24 LAB — MAGNESIUM: Magnesium: 2 mg/dL (ref 1.7–2.4)

## 2022-06-24 LAB — PHOSPHORUS: Phosphorus: 2.2 mg/dL — ABNORMAL LOW (ref 2.5–4.6)

## 2022-06-24 LAB — BASIC METABOLIC PANEL
Anion gap: 6 (ref 5–15)
BUN: 9 mg/dL (ref 6–20)
CO2: 22 mmol/L (ref 22–32)
Calcium: 8.3 mg/dL — ABNORMAL LOW (ref 8.9–10.3)
Chloride: 108 mmol/L (ref 98–111)
Creatinine, Ser: 1.15 mg/dL — ABNORMAL HIGH (ref 0.44–1.00)
GFR, Estimated: 56 mL/min — ABNORMAL LOW (ref >60)
Glucose, Bld: 84 mg/dL (ref 70–99)
Potassium: 3.8 mmol/L (ref 3.5–5.1)
Sodium: 136 mmol/L (ref 135–145)

## 2022-06-24 MED ORDER — POTASSIUM & SODIUM PHOSPHATES 280-160-250 MG PO PACK
1.0000 | PACK | Freq: Three times a day (TID) | ORAL | Status: DC
  Administered 2022-06-24 – 2022-06-26 (×8): 1 via ORAL
  Filled 2022-06-24 (×8): qty 1

## 2022-06-24 NOTE — Progress Notes (Signed)
   06/24/22 1700  Output (mL)  Emesis 200 mL  Unmeasured Output  Emesis Occurrence 1  Emesis Characteristics  Emesis Appearance Yellow

## 2022-06-24 NOTE — Progress Notes (Signed)
PROGRESS NOTE    Joy [name]  LNL:[id number] DOB: 01-29-1966 DOA: 06/21/2022 PCP: Debbrah Alar, NP    Brief Narrative:   56 year old patient with history of hypertension on lisinopril, prediabetes and morbid obesity on Victoza presented with 2 days of nausea vomiting and intractable diarrhea with dizziness lightheadedness.  In the emergency room initially low normal blood pressures responded to IV fluids, found to have acute kidney injury and moderate dehydration.   Admitted due to significant symptoms.   Still symptomatic.  CT scan with diffuse enteritis.   Assessment & Plan:   Severe gastroenteritis, suspect viral gastroenteritis with abdominal pain nausea and vomiting. Patient tolerating clears today.  Will challenge with regular diet.  Continue IV fluid today.  Still symptomatic and unable to keep up with oral intake and will need IV fluids. C. difficile and GI pathogen panel were negative, can use Imodium.  Essential hypertension: Blood pressures low on presentation.  Hold losartan until clinical improvement.  Acute kidney injury with acute tubular necrosis, moderate dehydration: Continue IV fluid.  Renal functions improving.  Urine output is adequate.  Recheck tomorrow morning.  Rheumatoid arthritis: Patient on Plaquenil and leflunomide.  Hold until diarrhea improves.  Hypophosphatemia: Replace.   DVT prophylaxis: heparin injection 5,000 Units Start: 06/22/22 0715   Code Status: Full code Family Communication: None at the bedside Disposition Plan: Status is: Inpatient.  Still in adequate oral hydration.  Will need IV fluid.   Consultants:  None  Procedures:  None  Antimicrobials:  None   Subjective: Patient seen and examined.  No nausea since last night and trying to eat some food.  3 episodes of loose watery stool overnight.  Abdominal pain has improved with occasional cramping now.  Objective: Vitals:   06/23/22 1041 06/23/22 1330 06/23/22  2001 06/24/22 0618  BP:  (!) 153/100 132/80 128/83  Pulse: (!) 101 100 (!) 105 94  Resp: 18 (!) '21 20 20  '$ Temp:  98.7 F (37.1 C) 98.5 F (36.9 C) 97.9 F (36.6 C)  TempSrc:  Oral    SpO2: 96% 100% 97% 98%  Weight:      Height:        Intake/Output Summary (Last 24 hours) at 06/24/2022 1136 Last data filed at 06/24/2022 1035 Gross per 24 hour  Intake 3444.64 ml  Output --  Net 3444.64 ml   Filed Weights   06/21/22 1759  Weight: 115.2 kg    Examination:  General exam: Appears comfortable at rest. Respiratory system: Clear to auscultation. Respiratory effort normal.  No added sounds. Cardiovascular system: S1 & S2 heard, RRR.  No pedal edema. Gastrointestinal system: Soft.  Nontender.  Bowel sound present.  No rigidity or guarding.   Central nervous system: Alert and oriented. No focal neurological deficits. Extremities: Symmetric 5 x 5 power. Skin: No rashes, lesions or ulcers Psychiatry: Anxious.    Data Reviewed: I have personally reviewed following labs and imaging studies  CBC: Recent Labs  Lab 06/21/22 1809 06/24/22 0612  WBC 7.5 6.9  NEUTROABS  --  4.5  HGB 13.6 9.6*  HCT 42.4 30.5*  MCV 90.6 93.6  PLT 334 408   Basic Metabolic Panel: Recent Labs  Lab 06/21/22 1809 06/23/22 0447 06/24/22 0612  NA 138 137 136  K 3.6 3.8 3.8  CL 101 106 108  CO2 '23 23 22  '$ GLUCOSE 131* 99 84  BUN 23* 15 9  CREATININE 2.71* 1.26* 1.15*  CALCIUM 8.9 8.6* 8.3*  MG  --   --  2.0  PHOS  --   --  2.2*   GFR: Estimated Creatinine Clearance: 68 mL/min (A) (by C-G formula based on SCr of 1.15 mg/dL (H)). Liver Function Tests: Recent Labs  Lab 06/21/22 1809  AST 38  ALT 21  ALKPHOS 78  BILITOT 0.6  PROT 8.6*  ALBUMIN 4.0   Recent Labs  Lab 06/21/22 1809  LIPASE 29   No results for input(s): "AMMONIA" in the last 168 hours. Coagulation Profile: No results for input(s): "INR", "PROTIME" in the last 168 hours. Cardiac Enzymes: No results for input(s):  "CKTOTAL", "CKMB", "CKMBINDEX", "TROPONINI" in the last 168 hours. BNP (last 3 results) No results for input(s): "PROBNP" in the last 8760 hours. HbA1C: No results for input(s): "HGBA1C" in the last 72 hours. CBG: No results for input(s): "GLUCAP" in the last 168 hours. Lipid Profile: No results for input(s): "CHOL", "HDL", "LDLCALC", "TRIG", "CHOLHDL", "LDLDIRECT" in the last 72 hours. Thyroid Function Tests: No results for input(s): "TSH", "T4TOTAL", "FREET4", "T3FREE", "THYROIDAB" in the last 72 hours. Anemia Panel: No results for input(s): "VITAMINB12", "FOLATE", "FERRITIN", "TIBC", "IRON", "RETICCTPCT" in the last 72 hours. Sepsis Labs: No results for input(s): "PROCALCITON", "LATICACIDVEN" in the last 168 hours.  Recent Results (from the past 240 hour(s))  Gastrointestinal Panel by PCR , Stool     Status: None   Collection Time: 06/22/22  1:20 AM   Specimen: Stool  Result Value Ref Range Status   Campylobacter species NOT DETECTED NOT DETECTED Final   Plesimonas shigelloides NOT DETECTED NOT DETECTED Final   Salmonella species NOT DETECTED NOT DETECTED Final   Yersinia enterocolitica NOT DETECTED NOT DETECTED Final   Vibrio species NOT DETECTED NOT DETECTED Final   Vibrio cholerae NOT DETECTED NOT DETECTED Final   Enteroaggregative E coli (EAEC) NOT DETECTED NOT DETECTED Final   Enteropathogenic E coli (EPEC) NOT DETECTED NOT DETECTED Final   Enterotoxigenic E coli (ETEC) NOT DETECTED NOT DETECTED Final   Shiga like toxin producing E coli (STEC) NOT DETECTED NOT DETECTED Final   Shigella/Enteroinvasive E coli (EIEC) NOT DETECTED NOT DETECTED Final   Cryptosporidium NOT DETECTED NOT DETECTED Final   Cyclospora cayetanensis NOT DETECTED NOT DETECTED Final   Entamoeba histolytica NOT DETECTED NOT DETECTED Final   Giardia lamblia NOT DETECTED NOT DETECTED Final   Adenovirus F40/41 NOT DETECTED NOT DETECTED Final   Astrovirus NOT DETECTED NOT DETECTED Final   Norovirus GI/GII  NOT DETECTED NOT DETECTED Final   Rotavirus A NOT DETECTED NOT DETECTED Final   Sapovirus (I, II, IV, and V) NOT DETECTED NOT DETECTED Final    Comment: Performed at [facility], [address] [place], [place] [address]  C Difficile Quick Screen w PCR reflex     Status: None   Collection Time: 06/22/22  1:20 AM   Specimen: Stool  Result Value Ref Range Status   C Diff antigen NEGATIVE NEGATIVE Final   C Diff toxin NEGATIVE NEGATIVE Final   C Diff interpretation No C. difficile detected.  Final    Comment: Performed at [facility], [place] [address], [place], [place] [address]         Radiology Studies: No results found.      Scheduled Meds:  atorvastatin  20 mg Oral QPM   budesonide  2 mL Inhalation BID   heparin  5,000 Units Subcutaneous Q8H   potassium & sodium phosphates  1 packet Oral TID WC & HS   Continuous Infusions:  lactated ringers 125  mL/hr at 06/24/22 1035     LOS: 1 day    Time spent: 35 minutes    Barb Merino, MD [facility] Pager [phone]

## 2022-06-24 NOTE — Plan of Care (Signed)

## 2022-06-25 LAB — BASIC METABOLIC PANEL
Anion gap: 8 (ref 5–15)
BUN: 8 mg/dL (ref 6–20)
CO2: 22 mmol/L (ref 22–32)
Calcium: 8.6 mg/dL — ABNORMAL LOW (ref 8.9–10.3)
Chloride: 110 mmol/L (ref 98–111)
Creatinine, Ser: 1.02 mg/dL — ABNORMAL HIGH (ref 0.44–1.00)
GFR, Estimated: >60 mL/min (ref >60)
Glucose, Bld: 82 mg/dL (ref 70–99)
Potassium: 3.7 mmol/L (ref 3.5–5.1)
Sodium: 140 mmol/L (ref 135–145)

## 2022-06-25 MED ORDER — PROSOURCE PLUS PO LIQD: 30.0000 mL | Freq: Every day | ORAL | Status: DC

## 2022-06-25 MED ORDER — ENSURE ENLIVE PO LIQD: 237.0000 mL | ORAL | Status: DC

## 2022-06-25 MED ORDER — BOOST / RESOURCE BREEZE PO LIQD CUSTOM: 1.0000 | Freq: Two times a day (BID) | ORAL | Status: DC

## 2022-06-25 MED ORDER — ADULT MULTIVITAMIN W/MINERALS CH
1.0000 | ORAL_TABLET | Freq: Every day | ORAL | Status: DC
  Administered 2022-06-26: 1 via ORAL
  Filled 2022-06-25: qty 1

## 2022-06-25 NOTE — Progress Notes (Signed)
PROGRESS NOTE    Joy [name]  YWV:[id number] DOB: 06/13/1966 DOA: 06/21/2022 PCP: Debbrah Alar, NP    Brief Narrative:   56 year old patient with history of hypertension on lisinopril, prediabetes and morbid obesity on Victoza presented with 2 days of nausea vomiting and intractable diarrhea with dizziness lightheadedness.  In the emergency room initially low normal blood pressures responded to IV fluids, found to have acute kidney injury and moderate dehydration.   Admitted due to significant symptoms.   Patient remained in the hospital due to significant symptoms.  CT scan with diffuse enteritis.   Assessment & Plan:   Severe gastroenteritis, suspect viral gastroenteritis with abdominal pain nausea and vomiting. Improving symptoms, however still intermittently vomiting.  Continue to challenge with soft food.  Continue IV fluids today. Scheduled Zofran 30 minutes before meal and wanted. C. difficile and GI pathogen panel were negative, can use Imodium.  Essential hypertension: Blood pressures low on presentation.  Continue to hold losartan until good clinical improvement.   Acute kidney injury with acute tubular necrosis, moderate dehydration: Continue IV fluid.  Renal functions improving.  Urine output is adequate.  Renal functions improving.  Rheumatoid arthritis: Patient on Plaquenil and leflunomide.  Hold until diarrhea improves.  Hypophosphatemia: Replaced.   DVT prophylaxis: heparin injection 5,000 Units Start: 06/22/22 0715   Code Status: Full code Family Communication: None at the bedside Disposition Plan: Status is: Inpatient.  Still symptomatic.  Vomiting.  Will need IV fluid.   Consultants:  None  Procedures:  None  Antimicrobials:  None   Subjective: Seen and examined.  Overnight no more diarrhea however she had large emesis overnight after eating each meal.  Nervous about eating.  She wants to continue trying some soft meal and liquids so  she can go home.  Objective: Vitals:   06/24/22 2002 06/24/22 2019 06/25/22 0450 06/25/22 0832  BP:  131/84 108/66   Pulse:  (!) 102 91   Resp:  18 20   Temp:  98.1 F (36.7 C) 98.4 F (36.9 C)   TempSrc:  Oral Oral   SpO2: 96% 98% 99% 99%  Weight:      Height:        Intake/Output Summary (Last 24 hours) at 06/25/2022 1202 Last data filed at 06/25/2022 0215 Gross per 24 hour  Intake 810.14 ml  Output 800 ml  Net 10.14 ml    Filed Weights   06/21/22 1759  Weight: 115.2 kg    Examination:  General exam: Appears comfortable at rest.  Anxious appropriately. Respiratory system: Clear to auscultation. Respiratory effort normal.  No added sounds. Cardiovascular system: S1 & S2 heard, RRR.  No pedal edema. Gastrointestinal system: Soft.  Nontender.  Bowel sound present.  No rigidity or guarding.        Data Reviewed: I have personally reviewed following labs and imaging studies  CBC: Recent Labs  Lab 06/21/22 1809 06/24/22 0612  WBC 7.5 6.9  NEUTROABS  --  4.5  HGB 13.6 9.6*  HCT 42.4 30.5*  MCV 90.6 93.6  PLT 334 854    Basic Metabolic Panel: Recent Labs  Lab 06/21/22 1809 06/23/22 0447 06/24/22 0612 06/25/22 0553  NA 138 137 136 140  K 3.6 3.8 3.8 3.7  CL 101 106 108 110  CO2 '23 23 22 22  '$ GLUCOSE 131* 99 84 82  BUN 23* '15 9 8  '$ CREATININE 2.71* 1.26* 1.15* 1.02*  CALCIUM 8.9 8.6* 8.3* 8.6*  MG  --   --  2.0  --   PHOS  --   --  2.2*  --     GFR: Estimated Creatinine Clearance: 76.7 mL/min (A) (by C-G formula based on SCr of 1.02 mg/dL (H)). Liver Function Tests: Recent Labs  Lab 06/21/22 1809  AST 38  ALT 21  ALKPHOS 78  BILITOT 0.6  PROT 8.6*  ALBUMIN 4.0    Recent Labs  Lab 06/21/22 1809  LIPASE 29    No results for input(s): "AMMONIA" in the last 168 hours. Coagulation Profile: No results for input(s): "INR", "PROTIME" in the last 168 hours. Cardiac Enzymes: No results for input(s): "CKTOTAL", "CKMB", "CKMBINDEX",  "TROPONINI" in the last 168 hours. BNP (last 3 results) No results for input(s): "PROBNP" in the last 8760 hours. HbA1C: No results for input(s): "HGBA1C" in the last 72 hours. CBG: No results for input(s): "GLUCAP" in the last 168 hours. Lipid Profile: No results for input(s): "CHOL", "HDL", "LDLCALC", "TRIG", "CHOLHDL", "LDLDIRECT" in the last 72 hours. Thyroid Function Tests: No results for input(s): "TSH", "T4TOTAL", "FREET4", "T3FREE", "THYROIDAB" in the last 72 hours. Anemia Panel: No results for input(s): "VITAMINB12", "FOLATE", "FERRITIN", "TIBC", "IRON", "RETICCTPCT" in the last 72 hours. Sepsis Labs: No results for input(s): "PROCALCITON", "LATICACIDVEN" in the last 168 hours.  Recent Results (from the past 240 hour(s))  Gastrointestinal Panel by PCR , Stool     Status: None   Collection Time: 06/22/22  1:20 AM   Specimen: Stool  Result Value Ref Range Status   Campylobacter species NOT DETECTED NOT DETECTED Final   Plesimonas shigelloides NOT DETECTED NOT DETECTED Final   Salmonella species NOT DETECTED NOT DETECTED Final   Yersinia enterocolitica NOT DETECTED NOT DETECTED Final   Vibrio species NOT DETECTED NOT DETECTED Final   Vibrio cholerae NOT DETECTED NOT DETECTED Final   Enteroaggregative E coli (EAEC) NOT DETECTED NOT DETECTED Final   Enteropathogenic E coli (EPEC) NOT DETECTED NOT DETECTED Final   Enterotoxigenic E coli (ETEC) NOT DETECTED NOT DETECTED Final   Shiga like toxin producing E coli (STEC) NOT DETECTED NOT DETECTED Final   Shigella/Enteroinvasive E coli (EIEC) NOT DETECTED NOT DETECTED Final   Cryptosporidium NOT DETECTED NOT DETECTED Final   Cyclospora cayetanensis NOT DETECTED NOT DETECTED Final   Entamoeba histolytica NOT DETECTED NOT DETECTED Final   Giardia lamblia NOT DETECTED NOT DETECTED Final   Adenovirus F40/41 NOT DETECTED NOT DETECTED Final   Astrovirus NOT DETECTED NOT DETECTED Final   Norovirus GI/GII NOT DETECTED NOT DETECTED Final    Rotavirus A NOT DETECTED NOT DETECTED Final   Sapovirus (I, II, IV, and V) NOT DETECTED NOT DETECTED Final    Comment: Performed at [facility], [address] [place], [place] [address]  C Difficile Quick Screen w PCR reflex     Status: None   Collection Time: 06/22/22  1:20 AM   Specimen: Stool  Result Value Ref Range Status   C Diff antigen NEGATIVE NEGATIVE Final   C Diff toxin NEGATIVE NEGATIVE Final   C Diff interpretation No C. difficile detected.  Final    Comment: Performed at [facility], [place] [address], [place], [place] [address]         Radiology Studies: No results found.      Scheduled Meds:  atorvastatin  20 mg Oral QPM   budesonide  2 mL Inhalation BID   heparin  5,000 Units Subcutaneous Q8H   potassium & sodium phosphates  1 packet Oral TID WC &  HS   Continuous Infusions:  lactated ringers 75 mL/hr at 06/25/22 0315     LOS: 2 days    Time spent: 35 minutes    Barb Merino, MD [facility] Pager [phone]

## 2022-06-25 NOTE — Progress Notes (Signed)
Initial Nutrition Assessment RD working remotely.  DOCUMENTATION CODES:   Morbid obesity  INTERVENTION:  - ordered Boost Breeze BID, each supplement provides 250 kcal and 9 grams of protein.  - ordered Ensure Plus High Protein once/day, each supplement provides 350 kcal and 20 grams of protein.  - ordered 30 ml Prosource Plus once/day, each supplement provides 100 kcal and 15 grams protein.   - ordered 1 tablet multivitamin with minerals/day.  - complete NFPE when feasible.   NUTRITION DIAGNOSIS:   Inadequate oral intake related to acute illness, nausea, vomiting, diarrhea as evidenced by per patient/family report.  GOAL:   Patient will meet greater than or equal to 90% of their needs  MONITOR:   PO intake, Supplement acceptance, Labs, Weight trends, I & O's  REASON FOR ASSESSMENT:   Malnutrition Screening Tool  ASSESSMENT:   56 year old female with medical history of HTN, prediabetes, rheumatoid arthritis, osteoarthritis, stage 2 CKD, steatosis of liver, folic acid deficiency, pernicious anemia, asthma, hypercholesterolemia, vitamin D deficiency, and morbid obesity on Victoza. She presented to the ED due to 2 day history of intractable N/V/D, dizziness, and lightheadedness. In the ED she was found to have AKI and be moderately dehydrated. CT showed diffuse enteritis.  Diet advanced to CLD on 11/9 at 28 and then to Regular at 1047 before being downgraded to FLD at 1421. Diet advanced from FLD to Soft yesterday at [facility]. She has consumed 0-75% at meals since 11/9 including 75% on Soft diet for lunch on 11/11.  Weight on 11/8 was 254 lb and weight has been stable the past 2 months. Weight on 6/27 was 263 lb which indicates 9 lb weight loss (3.4% body weight) in the past 4.5 months. No information documented in the edema section of flow sheet.   Per notes: - severe gastroenteritis - AKI with ATN and moderate dehydration--improving   Labs reviewed; creatinine: 1.02 mg/dl,  Ca: 8.6 mg/dl, Phos: 2.2 mg/dl. Medications reviewed; 1 packet Phos-nak TID. IVF; LR @ 75 ml/hr.     NUTRITION - FOCUSED PHYSICAL EXAM:  RD working remotely.  Diet Order:   Diet Order             DIET SOFT Room service appropriate? Yes; Fluid consistency: Thin  Diet effective now                   EDUCATION NEEDS:   No education needs have been identified at this time  Skin:  Skin Assessment: Reviewed RN Assessment  Last BM:  11/9 (type 7, large amount)  Height:   Ht Readings from Last 1 Encounters:  06/21/22 '5\' 4"'$  (1.626 m)    Weight:   Wt Readings from Last 1 Encounters:  06/21/22 115.2 kg    BMI:  Body mass index is 43.6 kg/m.  Estimated Nutritional Needs:  Kcal:  1900-2100 kcal Protein:  90-100 grams Fluid:  >/= 2.3 L/day      Jarome Matin, MS, RD, LDN, CNSC Clinical Dietitian PRN/Relief staff On-call/weekend pager # available in [facility]

## 2022-06-26 ENCOUNTER — Encounter (INDEPENDENT_AMBULATORY_CARE_PROVIDER_SITE_OTHER): Payer: Self-pay | Admitting: Family Medicine

## 2022-06-26 ENCOUNTER — Telehealth (INDEPENDENT_AMBULATORY_CARE_PROVIDER_SITE_OTHER): Payer: Medicare Other | Admitting: Family Medicine

## 2022-06-26 DIAGNOSIS — E669 Obesity, unspecified: Secondary | ICD-10-CM

## 2022-06-26 DIAGNOSIS — E88819 Insulin resistance, unspecified: Secondary | ICD-10-CM | POA: Diagnosis not present

## 2022-06-26 DIAGNOSIS — Z6841 Body Mass Index (BMI) 40.0 and over, adult: Secondary | ICD-10-CM | POA: Diagnosis not present

## 2022-06-26 DIAGNOSIS — E538 Deficiency of other specified B group vitamins: Secondary | ICD-10-CM

## 2022-06-26 LAB — BASIC METABOLIC PANEL
Anion gap: 9 (ref 5–15)
BUN: <5 mg/dL — ABNORMAL LOW (ref 6–20)
CO2: 22 mmol/L (ref 22–32)
Calcium: 8.5 mg/dL — ABNORMAL LOW (ref 8.9–10.3)
Chloride: 108 mmol/L (ref 98–111)
Creatinine, Ser: 1.02 mg/dL — ABNORMAL HIGH (ref 0.44–1.00)
GFR, Estimated: >60 mL/min (ref >60)
Glucose, Bld: 85 mg/dL (ref 70–99)
Potassium: 3.3 mmol/L — ABNORMAL LOW (ref 3.5–5.1)
Sodium: 139 mmol/L (ref 135–145)

## 2022-06-26 MED ORDER — POTASSIUM CHLORIDE CRYS ER 20 MEQ PO TBCR
40.0000 meq | EXTENDED_RELEASE_TABLET | Freq: Once | ORAL | Status: AC
  Administered 2022-06-26: 40 meq via ORAL
  Filled 2022-06-26: qty 2

## 2022-06-26 MED ORDER — LIRAGLUTIDE 18 MG/3ML ~~LOC~~ SOPN: 1.8000 mg | PEN_INJECTOR | Freq: Every day | SUBCUTANEOUS | 0 refills | Status: DC

## 2022-06-26 NOTE — Discharge Summary (Signed)
Physician Discharge Summary  [name]:[id number] DOB: [birth year] DOA: 06/21/2022  PCP: Debbrah Alar, NP  Admit date: 06/21/2022 Discharge date: 06/26/2022  Admitted From: Home Disposition: Home  Recommendations for Outpatient Follow-up:  Follow up with PCP in 1-2 weeks Please obtain BMP/CBC in one week   Home Health: N/A Equipment/Devices: N/A  Discharge Condition: Stable CODE STATUS: Full code Diet recommendation: Low-salt diet, adequate oral hydration  Discharge summary: 56 year old patient with history of hypertension on lisinopril, prediabetes ,morbid obesity on Victoza presented with 2 days of nausea vomiting and intractable diarrhea with dizziness lightheadedness.  In the emergency room initially low normal blood pressures responded to IV fluids, found to have acute kidney injury and moderate dehydration.   Admitted due to significant symptoms.   Patient remained in the hospital due to significant symptoms and unable to eat.  CT scan with diffuse enteritis. C. difficile and GI pathogen panel negative. After prolonged treatment, currently tolerating regular diet without need for nausea medications.  Diarrhea has improved, 2 loose bowel movement last 24 hours. Renal functions normalized to her baseline.  Electrolytes are adequate.  Potassium and magnesium was replaced before discharge.  Severe gastroenteritis with intractable nausea vomiting and abdominal pain, suspected viral gastroenteritis. Symptomatically improved.  Oral hydration at home. Due to significant diarrhea, she will hold Plaquenil, leflunomide and lisinopril until all clinical symptoms improved. Stable for discharge.   Discharge Diagnoses:  Principal Problem:   AKI (acute kidney injury) ([place]) Active Problems:   Nausea vomiting and diarrhea   Moderate persistent asthma   Rheumatoid arthritis (HCC)   Essential hypertension   Prediabetes   Gastroenteritis and colitis,  viral    Discharge Instructions  Discharge Instructions     Diet - low sodium heart healthy   Complete by: As directed    Discharge instructions   Complete by: As directed    Resume [place], leflunomide and lisinopril after all diarrhea is improved.   Increase activity slowly   Complete by: As directed       Allergies as of 06/26/2022   No Known Allergies      Medication List     TAKE these medications    acetaminophen 500 MG tablet Commonly known as: TYLENOL Take 1,000 mg by mouth daily as needed for headache.   albuterol 108 (90 Base) MCG/ACT inhaler Commonly known as: VENTOLIN HFA INHALE 2 INHALATIONS BY MOUTH EVERY 4 HOURS AS NEEDED FOR WHEEZING OR SHORTNESS OF BREATH What changed:  how much to take how to take this when to take this reasons to take this additional instructions   atorvastatin 20 MG tablet Commonly known as: LIPITOR TAKE 1 TABLET(20 MG) BY MOUTH DAILY What changed:  how much to take how to take this when to take this additional instructions   betamethasone dipropionate 0.05 % cream Apply topically 2 (two) times daily. What changed:  how much to take when to take this reasons to take this   cetirizine 10 MG tablet Commonly known as: ZyrTEC Allergy Take 1 tablet (10 mg total) by mouth daily.   Flovent Diskus 50 MCG/ACT Aepb Generic drug: Fluticasone Propionate (Inhal) Inhale 2 puffs into the lungs 2 (two) times daily. 50 mcg What changed:  when to take this additional instructions   fluticasone 50 MCG/ACT nasal spray Commonly known as: FLONASE Place 2 sprays into both nostrils daily. What changed:  when to take this reasons to take this   hydroxychloroquine 200 MG tablet Commonly known as: PLAQUENIL Take 200 mg  by mouth daily.   leflunomide 20 MG tablet Commonly known as: ARAVA Take 20 mg by mouth daily.   liraglutide 18 MG/3ML Sopn Commonly known as: VICTOZA Inject 1.8 mg into the skin daily.   lisinopril 5 MG  tablet Commonly known as: ZESTRIL TAKE ONE TABLET BY MOUTH DAILY AT 9 AM What changed: See the new instructions.   REFRESH OP Place 2 drops into both eyes daily.   traMADol 50 MG tablet Commonly known as: ULTRAM Take 50 mg by mouth daily as needed for moderate pain.   Vitamin D3 125 MCG (5000 UT) Caps Take 1 capsule (5,000 Units total) by mouth daily.   WOMENS MULTIVITAMIN PO Take 1 tablet by mouth daily.        No Known Allergies  Consultations: None   Procedures/Studies: CT ABDOMEN PELVIS W CONTRAST  Result Date: 06/21/2022 CLINICAL DATA:  Abdominal pain. EXAM: CT ABDOMEN AND PELVIS WITH CONTRAST TECHNIQUE: Multidetector CT imaging of the abdomen and pelvis was performed using the standard protocol following bolus administration of intravenous contrast. RADIATION DOSE REDUCTION: This exam was performed according to the departmental dose-optimization program which includes automated exposure control, adjustment of the mA and/or kV according to patient size and/or use of iterative reconstruction technique. CONTRAST:  17m OMNIPAQUE IOHEXOL 300 MG/ML  SOLN COMPARISON:  CT abdomen pelvis dated 12/30/2014. FINDINGS: Lower chest: The visualized lung bases are clear. No intra-abdominal free air or free fluid. Hepatobiliary: Mild fatty liver. Subcentimeter hypodense focus in the dome of the liver is too small to characterize. No biliary dilatation. The gallbladder is unremarkable. Pancreas: Unremarkable. No pancreatic ductal dilatation or surrounding inflammatory changes. Spleen: Normal in size without focal abnormality. Adrenals/Urinary Tract: The adrenal glands unremarkable. Subcentimeter hypodense lesion in the upper pole of the left kidney is too small to characterize. There is no hydronephrosis on either side. The visualized ureters and urinary bladder appear unremarkable. Stomach/Bowel: Mild diffuse thickened appearance of the small bowel loops with mild engorgement of the associated  mesentery most consistent with enteritis. Clinical correlation recommended. No bowel obstruction. There is loose stool throughout the colon consistent with diarrheal state. Correlation with clinical exam and stool cultures recommended. The appendix is normal. Vascular/Lymphatic: The abdominal aorta and IVC are unremarkable. No portal venous gas. There is no adenopathy. Reproductive: Hysterectomy.  No adnexal masses. Other: None Musculoskeletal: Degenerative changes of the spine. No acute osseous pathology. IMPRESSION: 1. Enteritis with diarrheal state. No bowel obstruction. Normal appendix. 2. Mild fatty liver. Electronically Signed   By: AAnner CreteM.D.   On: 06/21/2022 19:41   DG Chest Port 1 View  Result Date: 06/21/2022 CLINICAL DATA:  Abdominal pain vomiting and diarrhea EXAM: PORTABLE CHEST 1 VIEW COMPARISON:  Radiographs 11/02/2020 FINDINGS: The heart size and mediastinal contours are within normal limits. Both lungs are clear. The visualized skeletal structures are unremarkable. IMPRESSION: No active disease. Electronically Signed   By: TPlacido SouM.D.   On: 06/21/2022 19:39   Mammogram 3D SCREEN BREAST BILATERAL  Result Date: 06/08/2022 CLINICAL DATA:  Screening. EXAM: DIGITAL SCREENING BILATERAL MAMMOGRAM WITH TOMOSYNTHESIS AND CAD TECHNIQUE: Bilateral screening digital craniocaudal and mediolateral oblique mammograms were obtained. Bilateral screening digital breast tomosynthesis was performed. The images were evaluated with computer-aided detection. COMPARISON:  Previous exam(s). ACR Breast Density Category b: There are scattered areas of fibroglandular density. FINDINGS: There are no findings suspicious for malignancy. IMPRESSION: No mammographic evidence of malignancy. A result letter of this screening mammogram will be mailed directly to the patient.  RECOMMENDATION: Screening mammogram in one year. (Code:SM-B-01Y) BI-RADS CATEGORY  1: Negative. Electronically Signed   By: Ammie Ferrier M.D.   On: 06/08/2022 10:09   (Echo, Carotid, EGD, Colonoscopy, ERCP)    Subjective: Patient was seen and examined.  No overnight events.  She had some vomiting yesterday afternoon but none since then.  Denies any abdominal pain.  2 episodes of loose bowel movement last 24 hours.  Eager to go home.   Discharge Exam: Vitals:   06/26/22 0410 06/26/22 0912  BP: 114/78   Pulse: 92   Resp: 18   Temp: 98.1 F (36.7 C)   SpO2: 99% 97%   Vitals:   06/25/22 1934 06/25/22 2101 06/26/22 0410 06/26/22 0912  BP: (!) 148/88  114/78   Pulse: (!) 103  92   Resp: 19  18   Temp: 97.6 F (36.4 C)  98.1 F (36.7 C)   TempSrc: Oral  Oral   SpO2: 100% 98% 99% 97%  Weight:      Height:        General: Pt is alert, awake, not in acute distress Cardiovascular: RRR, S1/S2 +, no rubs, no gallops Respiratory: CTA bilaterally, no wheezing, no rhonchi Abdominal: Soft, NT, ND, bowel sounds + Extremities: no edema, no cyanosis    The results of significant diagnostics from this hospitalization (including imaging, microbiology, ancillary and laboratory) are listed below for reference.     Microbiology: Recent Results (from the past 240 hour(s))  Gastrointestinal Panel by PCR , Stool     Status: None   Collection Time: 06/22/22  1:20 AM   Specimen: Stool  Result Value Ref Range Status   Campylobacter species NOT DETECTED NOT DETECTED Final   Plesimonas shigelloides NOT DETECTED NOT DETECTED Final   Salmonella species NOT DETECTED NOT DETECTED Final   Yersinia enterocolitica NOT DETECTED NOT DETECTED Final   Vibrio species NOT DETECTED NOT DETECTED Final   Vibrio cholerae NOT DETECTED NOT DETECTED Final   Enteroaggregative E coli (EAEC) NOT DETECTED NOT DETECTED Final   Enteropathogenic E coli (EPEC) NOT DETECTED NOT DETECTED Final   Enterotoxigenic E coli (ETEC) NOT DETECTED NOT DETECTED Final   Shiga like toxin producing E coli (STEC) NOT DETECTED NOT DETECTED Final    Shigella/Enteroinvasive E coli (EIEC) NOT DETECTED NOT DETECTED Final   Cryptosporidium NOT DETECTED NOT DETECTED Final   Cyclospora cayetanensis NOT DETECTED NOT DETECTED Final   Entamoeba histolytica NOT DETECTED NOT DETECTED Final   Giardia lamblia NOT DETECTED NOT DETECTED Final   Adenovirus F40/41 NOT DETECTED NOT DETECTED Final   Astrovirus NOT DETECTED NOT DETECTED Final   Norovirus GI/GII NOT DETECTED NOT DETECTED Final   Rotavirus A NOT DETECTED NOT DETECTED Final   Sapovirus (I, II, IV, and V) NOT DETECTED NOT DETECTED Final    Comment: Performed at [facility], [address] [place], [place] [address]  C Difficile Quick Screen w PCR reflex     Status: None   Collection Time: 06/22/22  1:20 AM   Specimen: Stool  Result Value Ref Range Status   C Diff antigen NEGATIVE NEGATIVE Final   C Diff toxin NEGATIVE NEGATIVE Final   C Diff interpretation No C. difficile detected.  Final    Comment: Performed at [facility], [place] [address] [place], [place] [address]     Labs: BNP (last 3 results) No results for input(s): "BNP" in the last 8760 hours. Basic Metabolic Panel: Recent Labs  Lab 06/21/22  1809 06/23/22 0447 06/24/22 0612 06/25/22 0553 06/26/22 0535  NA 138 137 136 140 139  K 3.6 3.8 3.8 3.7 3.3*  CL 101 106 108 110 108  CO2 '23 23 22 22 22  '$ GLUCOSE 131* 99 84 82 85  BUN 23* '15 9 8 '$ <5*  CREATININE 2.71* 1.26* 1.15* 1.02* 1.02*  CALCIUM 8.9 8.6* 8.3* 8.6* 8.5*  MG  --   --  2.0  --   --   PHOS  --   --  2.2*  --   --    Liver Function Tests: Recent Labs  Lab 06/21/22 1809  AST 38  ALT 21  ALKPHOS 78  BILITOT 0.6  PROT 8.6*  ALBUMIN 4.0   Recent Labs  Lab 06/21/22 1809  LIPASE 29   No results for input(s): "AMMONIA" in the last 168 hours. CBC: Recent Labs  Lab 06/21/22 1809 06/24/22 0612  WBC 7.5 6.9  NEUTROABS  --  4.5  HGB 13.6 9.6*  HCT 42.4 30.5*  MCV 90.6 93.6  PLT 334 241   Cardiac Enzymes: No results  for input(s): "CKTOTAL", "CKMB", "CKMBINDEX", "TROPONINI" in the last 168 hours. BNP: Invalid input(s): "POCBNP" CBG: No results for input(s): "GLUCAP" in the last 168 hours. D-Dimer No results for input(s): "DDIMER" in the last 72 hours. Hgb A1c No results for input(s): "HGBA1C" in the last 72 hours. Lipid Profile No results for input(s): "CHOL", "HDL", "LDLCALC", "TRIG", "CHOLHDL", "LDLDIRECT" in the last 72 hours. Thyroid function studies No results for input(s): "TSH", "T4TOTAL", "T3FREE", "THYROIDAB" in the last 72 hours.  Invalid input(s): "FREET3" Anemia work up No results for input(s): "VITAMINB12", "FOLATE", "FERRITIN", "TIBC", "IRON", "RETICCTPCT" in the last 72 hours. Urinalysis    Component Value Date/Time   COLORURINE YELLOW 06/21/2022 2106   APPEARANCEUR CLEAR 06/21/2022 2106   LABSPEC 1.010 06/21/2022 2106   PHURINE 5.5 06/21/2022 2106   GLUCOSEU NEGATIVE 06/21/2022 2106   GLUCOSEU NEGATIVE 11/17/2014 1217   HGBUR MODERATE (A) 06/21/2022 2106   BILIRUBINUR NEGATIVE 06/21/2022 2106   BILIRUBINUR negative 11/10/2021 1447   BILIRUBINUR neg 09/29/2015 [place] 06/21/2022 2106   PROTEINUR 30 (A) 06/21/2022 2106   UROBILINOGEN 0.2 11/10/2021 1447   UROBILINOGEN 0.2 01/21/2015 0251   NITRITE NEGATIVE 06/21/2022 2106   LEUKOCYTESUR NEGATIVE 06/21/2022 2106   Sepsis Labs Recent Labs  Lab 06/21/22 1809 06/24/22 0612  WBC 7.5 6.9   Microbiology Recent Results (from the past 240 hour(s))  Gastrointestinal Panel by PCR , Stool     Status: None   Collection Time: 06/22/22  1:20 AM   Specimen: Stool  Result Value Ref Range Status   Campylobacter species NOT DETECTED NOT DETECTED Final   Plesimonas shigelloides NOT DETECTED NOT DETECTED Final   Salmonella species NOT DETECTED NOT DETECTED Final   Yersinia enterocolitica NOT DETECTED NOT DETECTED Final   Vibrio species NOT DETECTED NOT DETECTED Final   Vibrio cholerae NOT DETECTED NOT DETECTED  Final   Enteroaggregative E coli (EAEC) NOT DETECTED NOT DETECTED Final   Enteropathogenic E coli (EPEC) NOT DETECTED NOT DETECTED Final   Enterotoxigenic E coli (ETEC) NOT DETECTED NOT DETECTED Final   Shiga like toxin producing E coli (STEC) NOT DETECTED NOT DETECTED Final   Shigella/Enteroinvasive E coli (EIEC) NOT DETECTED NOT DETECTED Final   Cryptosporidium NOT DETECTED NOT DETECTED Final   Cyclospora cayetanensis NOT DETECTED NOT DETECTED Final   Entamoeba histolytica NOT DETECTED NOT DETECTED Final   Giardia lamblia NOT DETECTED  NOT DETECTED Final   Adenovirus F40/41 NOT DETECTED NOT DETECTED Final   Astrovirus NOT DETECTED NOT DETECTED Final   Norovirus GI/GII NOT DETECTED NOT DETECTED Final   Rotavirus A NOT DETECTED NOT DETECTED Final   Sapovirus (I, II, IV, and V) NOT DETECTED NOT DETECTED Final    Comment: Performed at [facility], [address], [place], [place] [address]  C Difficile Quick Screen w PCR reflex     Status: None   Collection Time: 06/22/22  1:20 AM   Specimen: Stool  Result Value Ref Range Status   C Diff antigen NEGATIVE NEGATIVE Final   C Diff toxin NEGATIVE NEGATIVE Final   C Diff interpretation No C. difficile detected.  Final    Comment: Performed at [facility], [place] [address], [place], [place] [address]     Time coordinating discharge: 28 minutes  SIGNED:   Barb Merino, MD  [facility] 06/26/2022, 10:16 AM

## 2022-06-27 ENCOUNTER — Telehealth: Payer: Self-pay

## 2022-06-27 ENCOUNTER — Ambulatory Visit: Payer: Medicare Other

## 2022-06-27 NOTE — Telephone Encounter (Signed)
Transition Care Management Unsuccessful Follow-up Telephone Call  Date of discharge and from where:  [facility] 06-26-22 Dx: AKI  Attempts:  1st Attempt  Reason for unsuccessful TCM follow-up call:  Left voice message   Juanda Crumble LPN [place] Advisor Direct Dial [phone]   Transition Care Management Follow-up Telephone Call Date of discharge and from where:   [facility] 06-26-22 Dx: AKI   How have you been since you were released from the hospital? Doing ok- having a little stomach pain after meals  Any questions or concerns? No  Items Reviewed: Did the pt receive and understand the discharge instructions provided? Yes  Medications obtained and verified? Yes  Other? No  Any new allergies since your discharge? No  Dietary orders reviewed? Yes Do you have support at home? Yes   Home Care and Equipment/Supplies: Were home health services ordered? no If so, what is the name of the agency? na  Has the agency set up a time to come to the patient's home? not applicable Were any new equipment or medical supplies ordered?  No What is the name of the medical supply agency? na Were you able to get the supplies/equipment? not applicable Do you have any questions related to the use of the equipment or supplies? No  Functional Questionnaire: (I = Independent and D = Dependent) ADLs: I  Bathing/Dressing- I  Meal Prep- I  Eating- I  Maintaining continence- I  Transferring/Ambulation- I  Managing Meds- I  Follow up appointments reviewed:  PCP Hospital f/u appt confirmed? Yes  Scheduled to see Debbrah Alar NP on 06-30-22 @ [facility] f/u appt confirmed? no. Are transportation arrangements needed? No  If their condition worsens, is the pt aware to call PCP or go to the Emergency Dept.? Yes Was the patient provided with contact information for the PCP's office or ED? Yes Was to pt encouraged to call back with questions or  concerns? Yes   Juanda Crumble LPN [place] Direct Dial [phone]

## 2022-06-28 ENCOUNTER — Telehealth: Payer: Self-pay

## 2022-06-28 NOTE — Telephone Encounter (Signed)
The pt has called and she has an appointment for 12/86/23 for a HOS f/u. Pt reports that she is requesting a referral to GI for the possible stomach ulcer or a follow up since she is still having bad stomach pains. She is eating a soft diet and informed her to drink boost or Ensure to help give her some nutrients.

## 2022-06-28 NOTE — Telephone Encounter (Signed)
Appointment will be moved to this Friday 06/30/22

## 2022-06-28 NOTE — Telephone Encounter (Signed)
We can bring her in sooner, first available with me please.

## 2022-06-30 ENCOUNTER — Ambulatory Visit (INDEPENDENT_AMBULATORY_CARE_PROVIDER_SITE_OTHER): Payer: Medicare Other | Admitting: Family

## 2022-06-30 VITALS — BP 136/74 | HR 88 | Temp 98.5°F | Resp 16 | Wt 244.0 lb

## 2022-06-30 DIAGNOSIS — L732 Hidradenitis suppurativa: Secondary | ICD-10-CM

## 2022-06-30 DIAGNOSIS — I10 Essential (primary) hypertension: Secondary | ICD-10-CM

## 2022-06-30 DIAGNOSIS — A084 Viral intestinal infection, unspecified: Secondary | ICD-10-CM | POA: Diagnosis not present

## 2022-06-30 DIAGNOSIS — N179 Acute kidney failure, unspecified: Secondary | ICD-10-CM | POA: Diagnosis not present

## 2022-06-30 MED ORDER — OMEPRAZOLE 40 MG PO CPDR: 40.0000 mg | DELAYED_RELEASE_CAPSULE | Freq: Every day | ORAL | 3 refills | Status: DC
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
## 2022-07-01 LAB — CBC WITH DIFFERENTIAL/PLATELET
Absolute Monocytes: 788 {cells}/uL (ref 200–950)
Basophils Absolute: 43 {cells}/uL (ref 0–200)
Basophils Relative: 0.4 %
Eosinophils Absolute: 86 {cells}/uL (ref 15–500)
Eosinophils Relative: 0.8 %
HCT: 31.9 % — ABNORMAL LOW (ref 35.0–45.0)
Hemoglobin: 10.6 g/dL — ABNORMAL LOW (ref 11.7–15.5)
Lymphs Abs: 2549 {cells}/uL (ref 850–3900)
MCH: 29.6 pg (ref 27.0–33.0)
MCHC: 33.2 g/dL (ref 32.0–36.0)
MCV: 89.1 fL (ref 80.0–100.0)
MPV: 9.5 fL (ref 7.5–12.5)
Monocytes Relative: 7.3 %
Neutro Abs: 7333 {cells}/uL (ref 1500–7800)
Neutrophils Relative %: 67.9 %
Platelets: 339 10*3/uL (ref 140–400)
RBC: 3.58 Million/uL — ABNORMAL LOW (ref 3.80–5.10)
RDW: 12.2 % (ref 11.0–15.0)
Total Lymphocyte: 23.6 %
WBC: 10.8 10*3/uL (ref 3.8–10.8)

## 2022-07-01 LAB — COMPREHENSIVE METABOLIC PANEL WITH GFR
AG Ratio: 1.3 (calc) (ref 1.0–2.5)
ALT: 21 U/L (ref 6–29)
AST: 26 U/L (ref 10–35)
Albumin: 3.6 g/dL (ref 3.6–5.1)
Alkaline phosphatase (APISO): 67 U/L (ref 37–153)
BUN/Creatinine Ratio: 5 (calc) — ABNORMAL LOW (ref 6–22)
BUN: 4 mg/dL — ABNORMAL LOW (ref 7–25)
CO2: 28 mmol/L (ref 20–32)
Calcium: 8.5 mg/dL — ABNORMAL LOW (ref 8.6–10.4)
Chloride: 102 mmol/L (ref 98–110)
Creat: 0.88 mg/dL (ref 0.50–1.03)
Globulin: 2.8 g/dL (ref 1.9–3.7)
Glucose, Bld: 92 mg/dL (ref 65–99)
Potassium: 3.7 mmol/L (ref 3.5–5.3)
Sodium: 142 mmol/L (ref 135–146)
Total Bilirubin: 0.4 mg/dL (ref 0.2–1.2)
Total Protein: 6.4 g/dL (ref 6.1–8.1)

## 2022-07-02 ENCOUNTER — Telehealth: Payer: Self-pay | Admitting: Family

## 2022-07-02 DIAGNOSIS — L732 Hidradenitis suppurativa: Secondary | ICD-10-CM | POA: Insufficient documentation

## 2022-07-02 DIAGNOSIS — D649 Anemia, unspecified: Secondary | ICD-10-CM

## 2022-07-02 MED ORDER — CEPHALEXIN 500 MG PO CAPS: 500.0000 mg | ORAL_CAPSULE | Freq: Three times a day (TID) | ORAL | 0 refills | Status: DC

## 2022-07-02 NOTE — Assessment & Plan Note (Signed)
Secondary to dehydration- was improved at time of discharge. Repeat today.

## 2022-07-02 NOTE — Assessment & Plan Note (Signed)
BP Readings from Last 3 Encounters:  06/30/22 136/74  06/26/22 114/78  05/24/22 110/77   BP stable.  Continue lisinopril.

## 2022-07-02 NOTE — Assessment & Plan Note (Signed)
Slowly resolving. She is currently on about day 9 of symptoms. We discussed that symptoms can last up to 14 days.  She has significant epigastric tenderness- recommended that she add omeprazole once daily. We will also place a referral to GI.  Encouraged her to eat bland easy to digest foods and maintain her hydration.  In the meantime, she is advised to go to the ER if symptoms worsen. Call if symptoms are not improved in 1 week.  Pt verbalizes understanding.

## 2022-07-02 NOTE — Assessment & Plan Note (Signed)
Rx sent for keflex. Discussed use of warm compresses, sitz baths to promote drainage.

## 2022-07-02 NOTE — Telephone Encounter (Signed)
Please advise pt hat anemia is improving, but still a little low. I would like for her to return to the lab for IFOB and iron studies.  Add mVI with minerals once daily.

## 2022-07-03 ENCOUNTER — Other Ambulatory Visit (INDEPENDENT_AMBULATORY_CARE_PROVIDER_SITE_OTHER): Payer: Medicare Other

## 2022-07-03 DIAGNOSIS — D649 Anemia, unspecified: Secondary | ICD-10-CM | POA: Diagnosis not present

## 2022-07-03 NOTE — Telephone Encounter (Signed)
Patient advised of results and to start multivit with minerals. She will come in today for labs and to pick up IFOB kit.

## 2022-07-04 ENCOUNTER — Telehealth: Payer: Self-pay | Admitting: Family

## 2022-07-04 ENCOUNTER — Other Ambulatory Visit (INDEPENDENT_AMBULATORY_CARE_PROVIDER_SITE_OTHER): Payer: Medicare Other

## 2022-07-04 DIAGNOSIS — D649 Anemia, unspecified: Secondary | ICD-10-CM | POA: Diagnosis not present

## 2022-07-04 DIAGNOSIS — M2391 Unspecified internal derangement of right knee: Secondary | ICD-10-CM | POA: Diagnosis not present

## 2022-07-04 DIAGNOSIS — S83231A Complex tear of medial meniscus, current injury, right knee, initial encounter: Secondary | ICD-10-CM | POA: Diagnosis not present

## 2022-07-04 DIAGNOSIS — M23304 Other meniscus derangements, unspecified medial meniscus, left knee: Secondary | ICD-10-CM | POA: Diagnosis not present

## 2022-07-04 DIAGNOSIS — M23301 Other meniscus derangements, unspecified lateral meniscus, left knee: Secondary | ICD-10-CM | POA: Diagnosis not present

## 2022-07-04 DIAGNOSIS — S83281A Other tear of lateral meniscus, current injury, right knee, initial encounter: Secondary | ICD-10-CM | POA: Diagnosis not present

## 2022-07-04 LAB — IRON,TIBC AND FERRITIN PANEL
%SAT: 14 % (calc) — ABNORMAL LOW (ref 16–45)
Ferritin: 270 ng/mL — ABNORMAL HIGH (ref 16–232)
Iron: 30 ug/dL — ABNORMAL LOW (ref 45–160)
TIBC: 216 mcg/dL (calc) — ABNORMAL LOW (ref 250–450)

## 2022-07-04 MED ORDER — FERROUS SULFATE 325 (65 FE) MG PO TABS: 325.0000 mg | ORAL_TABLET | ORAL | 3 refills | Status: AC

## 2022-07-04 NOTE — Telephone Encounter (Signed)
Patient advised of results and to start otc iron every other day. She verbalized understanding

## 2022-07-04 NOTE — Telephone Encounter (Signed)
Iron level is low. Please add iron '325mg'$  once daily every other day.  We can repeat iron studies when she returns in February.

## 2022-07-05 LAB — FECAL OCCULT BLOOD, IMMUNOCHEMICAL: Fecal Occult Bld: NEGATIVE

## 2022-07-10 ENCOUNTER — Encounter: Payer: Self-pay | Admitting: Nurse Practitioner

## 2022-07-11 NOTE — Progress Notes (Signed)
TeleHealth Visit:  Due to the COVID-19 pandemic, this visit was completed with telemedicine (audio/video) technology to reduce patient and provider exposure as well as to preserve personal protective equipment.   [name] has verbally consented to this TeleHealth visit. The patient is located at home, the provider is located at the [facility] office. The participants in this visit include the listed provider and patient. The visit was conducted today via MyChart Video.  Chief Complaint: OBESITY Joy [name] is here to discuss her progress with her obesity treatment plan along with follow-up of her obesity related diagnoses. Joy [name] is on the [employer] and states she is following her eating plan approximately 50% of the time. Joy [name] states she is doing water aerobics and riding bike 45;30 minutes 3;5 times per week.  Today's visit was #: 14 Starting weight: 268 lbs Starting date: 12/16/2019  Interim History: Joy [name] reports weight of 246 lbs today. She was hospitalized for 1 week for a viral infection and dehydration. Hopes to get back to [place]. Struggling with lack of appetite and with loose stools.  Subjective:   1. Insulin resistance Joy [name] is on Victoza without any GI side effects. (Was not getting in hospital). Previously on 1.8 mg.  2. Vitamin B12 deficiency Joy [name] is on medications for RA. Her last B12 level of 88.  Assessment/Plan:   1. Insulin resistance Start Victoza at 0.6 mg SubQ daily then increase 5 clicks after 1-2 weeks, for 1 month with 0 refills.  -Start liraglutide (VICTOZA) 18 MG/3ML SOPN; Inject 1.8 mg into the skin daily.  Dispense: 9 mL; Refill: 0  2. Vitamin B12 deficiency Will retest at next appointment.  3. Obesity with current BMI of 42.9 Joy [name] is currently in the action stage of change. As such, her goal is to continue with weight loss efforts. She has agreed to the [employer].   Exercise goals:  All adults should avoid inactivity. Some physical activity is better than none, and adults who participate in any amount of physical activity gain some health benefits.  Behavioral modification strategies: increasing lean protein intake, meal planning and cooking strategies, keeping healthy foods in the home, holiday eating strategies , and planning for success.  Joy [name] has agreed to follow-up with our clinic in 3 weeks. She was informed of the importance of frequent follow-up visits to maximize her success with intensive lifestyle modifications for her multiple health conditions.  Objective:   VITALS: Per patient if applicable, see vitals. GENERAL: Alert and in no acute distress. CARDIOPULMONARY: No increased WOB. Speaking in clear sentences.  PSYCH: Pleasant and cooperative. Speech normal rate and rhythm. Affect is appropriate. Insight and judgement are appropriate. Attention is focused, linear, and appropriate.  NEURO: Oriented as arrived to appointment on time with no prompting.   Lab Results  Component Value Date   CREATININE 0.88 06/30/2022   BUN 4 (L) 06/30/2022   NA 142 06/30/2022   K 3.7 06/30/2022   CL 102 06/30/2022   CO2 28 06/30/2022   Lab Results  Component Value Date   ALT 21 06/30/2022   AST 26 06/30/2022   ALKPHOS 78 06/21/2022   BILITOT 0.4 06/30/2022   Lab Results  Component Value Date   HGBA1C 5.1 01/11/2022   HGBA1C 5.3 09/20/2021   HGBA1C 5.4 05/24/2021   HGBA1C 5.3 02/07/2021   HGBA1C 5.3 09/28/2020   Lab Results  Component Value Date   INSULIN 14.1 01/11/2022   INSULIN 12.6 05/24/2021   INSULIN 7.1 02/07/2021  INSULIN 10.9 09/28/2020   INSULIN 24.0 04/08/2020   Lab Results  Component Value Date   TSH 2.100 12/16/2019   Lab Results  Component Value Date   CHOL 177 01/11/2022   HDL 45 01/11/2022   LDLCALC 118 (H) 01/11/2022   TRIG 73 01/11/2022   CHOLHDL 3.7 02/07/2021   Lab Results  Component Value Date   VD25OH 50.4 01/11/2022    VD25OH 58.3 05/24/2021   VD25OH 42.1 02/07/2021   Lab Results  Component Value Date   WBC 10.8 06/30/2022   HGB 10.6 (L) 06/30/2022   HCT 31.9 (L) 06/30/2022   MCV 89.1 06/30/2022   PLT 339 06/30/2022   Lab Results  Component Value Date   IRON 30 (L) 07/03/2022   TIBC 216 (L) 07/03/2022   FERRITIN 270 (H) 07/03/2022    Attestation Statements:   Reviewed by clinician on day of visit: allergies, medications, problem list, medical history, surgical history, family history, social history, and previous encounter notes.  I, Elnora Morrison, RMA am acting as transcriptionist for Coralie Common, MD.  I have reviewed the above documentation for accuracy and completeness, and I agree with the above. - Coralie Common, MD

## 2022-07-17 DIAGNOSIS — M25561 Pain in right knee: Secondary | ICD-10-CM | POA: Diagnosis not present

## 2022-07-17 DIAGNOSIS — M25661 Stiffness of right knee, not elsewhere classified: Secondary | ICD-10-CM | POA: Diagnosis not present

## 2022-07-17 DIAGNOSIS — M23303 Other meniscus derangements, unspecified medial meniscus, right knee: Secondary | ICD-10-CM | POA: Diagnosis not present

## 2022-07-17 DIAGNOSIS — R29898 Other symptoms and signs involving the musculoskeletal system: Secondary | ICD-10-CM | POA: Diagnosis not present

## 2022-07-17 DIAGNOSIS — Z7409 Other reduced mobility: Secondary | ICD-10-CM | POA: Diagnosis not present

## 2022-07-17 DIAGNOSIS — Z9889 Other specified postprocedural states: Secondary | ICD-10-CM | POA: Diagnosis not present

## 2022-07-17 DIAGNOSIS — M23304 Other meniscus derangements, unspecified medial meniscus, left knee: Secondary | ICD-10-CM | POA: Diagnosis not present

## 2022-07-18 ENCOUNTER — Encounter (INDEPENDENT_AMBULATORY_CARE_PROVIDER_SITE_OTHER): Payer: Self-pay | Admitting: Family Medicine

## 2022-07-18 ENCOUNTER — Ambulatory Visit (INDEPENDENT_AMBULATORY_CARE_PROVIDER_SITE_OTHER): Payer: Medicare Other | Admitting: Family Medicine

## 2022-07-18 VITALS — BP 116/77 | HR 91 | Temp 98.1°F | Ht 64.0 in | Wt 240.0 lb

## 2022-07-18 DIAGNOSIS — E669 Obesity, unspecified: Secondary | ICD-10-CM | POA: Diagnosis not present

## 2022-07-18 DIAGNOSIS — D508 Other iron deficiency anemias: Secondary | ICD-10-CM

## 2022-07-18 DIAGNOSIS — Z6841 Body Mass Index (BMI) 40.0 and over, adult: Secondary | ICD-10-CM

## 2022-07-19 ENCOUNTER — Ambulatory Visit: Payer: Medicare Other | Admitting: Family

## 2022-07-25 DIAGNOSIS — M25561 Pain in right knee: Secondary | ICD-10-CM | POA: Diagnosis not present

## 2022-07-25 DIAGNOSIS — Z9889 Other specified postprocedural states: Secondary | ICD-10-CM | POA: Diagnosis not present

## 2022-07-25 DIAGNOSIS — R29898 Other symptoms and signs involving the musculoskeletal system: Secondary | ICD-10-CM | POA: Diagnosis not present

## 2022-07-25 DIAGNOSIS — M25661 Stiffness of right knee, not elsewhere classified: Secondary | ICD-10-CM | POA: Diagnosis not present

## 2022-08-01 NOTE — Progress Notes (Signed)
Chief Complaint:   OBESITY Joy [name] is here to discuss her progress with her obesity treatment plan along with follow-up of her obesity related diagnoses. Joy [name] is on the [employer] and states she is following her eating plan approximately 50% of the time. Joy [name] states she is physical therapy 45 minutes 1 times per week.  Today's visit was #: 64 Starting weight: 268 lbs Starting date: 12/16/2019 Today's weight: 240 lbs Today's date: 07/18/2022 Total lbs lost to date: 28 lbs Total lbs lost since last in-office visit: 9  Interim History: Joy [name] feels that she is doing pretty well. Started PT yesterday and was told by Charlie Pitter, knee is healing well. Just really started eating regular food--2 weeks ago. Foodwise she wants to get back to [employer]. Should be cleared fron knee surgery in 1 month.  Subjective:   1. Other iron deficiency anemia Decreased Iron. H/H decrease. Joy [name] started on Iron pills over the counter by PCP.  Assessment/Plan:   1. Other iron deficiency anemia Repeat CBC and Anemia Panel in January.  2. Obesity with current BMI of 41.2 Joy [name] is currently in the action stage of change. As such, her goal is to continue with weight loss efforts. She has agreed to the [employer].   Exercise goals: Physical therapy.  Behavioral modification strategies: increasing lean protein intake, meal planning and cooking strategies, and keeping healthy foods in the home.  Joy [name] has agreed to follow-up with our clinic in 4 weeks. She was informed of the importance of frequent follow-up visits to maximize her success with intensive lifestyle modifications for her multiple health conditions.   Objective:   Blood pressure 116/77, pulse 91, temperature 98.1 F (36.7 C), height '5\' 4"'$  (1.626 m), weight 240 lb (108.9 kg), SpO2 99 %. Body mass index is 41.2 kg/m.  General: Cooperative, alert, well developed, in no acute distress. HEENT:  Conjunctivae and lids unremarkable. Cardiovascular: Regular rhythm.  Lungs: Normal work of breathing. Neurologic: No focal deficits.   Lab Results  Component Value Date   CREATININE 0.88 06/30/2022   BUN 4 (L) 06/30/2022   NA 142 06/30/2022   K 3.7 06/30/2022   CL 102 06/30/2022   CO2 28 06/30/2022   Lab Results  Component Value Date   ALT 21 06/30/2022   AST 26 06/30/2022   ALKPHOS 78 06/21/2022   BILITOT 0.4 06/30/2022   Lab Results  Component Value Date   HGBA1C 5.1 01/11/2022   HGBA1C 5.3 09/20/2021   HGBA1C 5.4 05/24/2021   HGBA1C 5.3 02/07/2021   HGBA1C 5.3 09/28/2020   Lab Results  Component Value Date   INSULIN 14.1 01/11/2022   INSULIN 12.6 05/24/2021   INSULIN 7.1 02/07/2021   INSULIN 10.9 09/28/2020   INSULIN 24.0 04/08/2020   Lab Results  Component Value Date   TSH 2.100 12/16/2019   Lab Results  Component Value Date   CHOL 177 01/11/2022   HDL 45 01/11/2022   LDLCALC 118 (H) 01/11/2022   TRIG 73 01/11/2022   CHOLHDL 3.7 02/07/2021   Lab Results  Component Value Date   VD25OH 50.4 01/11/2022   VD25OH 58.3 05/24/2021   VD25OH 42.1 02/07/2021   Lab Results  Component Value Date   WBC 10.8 06/30/2022   HGB 10.6 (L) 06/30/2022   HCT 31.9 (L) 06/30/2022   MCV 89.1 06/30/2022   PLT 339 06/30/2022   Lab Results  Component Value Date   IRON 30 (L) 07/03/2022   TIBC 216 (L) 07/03/2022  FERRITIN 270 (H) 07/03/2022   Attestation Statements:   Reviewed by clinician on day of visit: allergies, medications, problem list, medical history, surgical history, family history, social history, and previous encounter notes.  I, Elnora Morrison, RMA am acting as transcriptionist for Coralie Common, MD.  I have reviewed the above documentation for accuracy and completeness, and I agree with the above. - Coralie Common, MD

## 2022-08-04 DIAGNOSIS — M25661 Stiffness of right knee, not elsewhere classified: Secondary | ICD-10-CM | POA: Diagnosis not present

## 2022-08-04 DIAGNOSIS — R29898 Other symptoms and signs involving the musculoskeletal system: Secondary | ICD-10-CM | POA: Diagnosis not present

## 2022-08-04 DIAGNOSIS — M23303 Other meniscus derangements, unspecified medial meniscus, right knee: Secondary | ICD-10-CM | POA: Diagnosis not present

## 2022-08-04 DIAGNOSIS — Z9889 Other specified postprocedural states: Secondary | ICD-10-CM | POA: Diagnosis not present

## 2022-08-04 DIAGNOSIS — M25561 Pain in right knee: Secondary | ICD-10-CM | POA: Diagnosis not present

## 2022-08-04 DIAGNOSIS — Z7409 Other reduced mobility: Secondary | ICD-10-CM | POA: Diagnosis not present

## 2022-08-09 ENCOUNTER — Encounter: Payer: Self-pay | Admitting: Nurse Practitioner

## 2022-08-09 ENCOUNTER — Ambulatory Visit (INDEPENDENT_AMBULATORY_CARE_PROVIDER_SITE_OTHER): Payer: Medicare Other | Admitting: Nurse Practitioner

## 2022-08-09 VITALS — BP 128/70 | HR 89 | Ht 64.0 in | Wt 244.4 lb

## 2022-08-09 DIAGNOSIS — D509 Iron deficiency anemia, unspecified: Secondary | ICD-10-CM | POA: Diagnosis not present

## 2022-08-09 DIAGNOSIS — R1013 Epigastric pain: Secondary | ICD-10-CM
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
## 2022-08-09 NOTE — Patient Instructions (Signed)
You have been scheduled for an endoscopy. Please follow written instructions given to you at your visit today. If you use inhalers (even only as needed), please bring them with you on the day of your procedure.  Continue Omeprazole once daily.  Repeat CBC and iron panel with your PCP.   Due to recent changes in healthcare laws, you may see the results of your imaging and laboratory studies on MyChart before your provider has had a chance to review them.  We understand that in some cases there may be results that are confusing or concerning to you. Not all laboratory results come back in the same time frame and the provider may be waiting for multiple results in order to interpret others.  Please give [name] 48 hours in order for your provider to thoroughly review all the results before contacting the office for clarification of your results.    Thank you for trusting me with your gastrointestinal care!   Carl Best, CRNP

## 2022-08-10 NOTE — Progress Notes (Signed)
Reviewed and agree with management plans. ? ?[place] L. Shanaia Sievers, MD, MPH  ?

## 2022-08-16 ENCOUNTER — Encounter (INDEPENDENT_AMBULATORY_CARE_PROVIDER_SITE_OTHER): Payer: Self-pay | Admitting: Family Medicine

## 2022-08-16 ENCOUNTER — Telehealth (INDEPENDENT_AMBULATORY_CARE_PROVIDER_SITE_OTHER): Payer: Medicare Other | Admitting: Family Medicine

## 2022-08-16 DIAGNOSIS — Z6841 Body Mass Index (BMI) 40.0 and over, adult: Secondary | ICD-10-CM

## 2022-08-16 DIAGNOSIS — D508 Other iron deficiency anemias: Secondary | ICD-10-CM | POA: Diagnosis not present

## 2022-08-16 DIAGNOSIS — E669 Obesity, unspecified: Secondary | ICD-10-CM | POA: Diagnosis not present

## 2022-08-16 DIAGNOSIS — E88819 Insulin resistance, unspecified: Secondary | ICD-10-CM | POA: Diagnosis not present

## 2022-08-16 MED ORDER — TIRZEPATIDE 2.5 MG/0.5ML ~~LOC~~ SOAJ: 2.5000 mg | SUBCUTANEOUS | 0 refills | Status: DC

## 2022-08-21 ENCOUNTER — Ambulatory Visit: Payer: Self-pay

## 2022-08-21 NOTE — Telephone Encounter (Signed)
Patient instructed as indicated on the message. She verbalized understanding.

## 2022-08-21 NOTE — Telephone Encounter (Signed)
Message from [place] sent at 08/21/2022  2:20 PM EST  Summary: Med management   Pt stated she uses Victoza and yesterday started with tirzepatide [facility]) 2.5 MG/0.5ML PenPt stated Victoza is once a day and tirzepatide [facility]) 2.5 MG/0.5ML Pen is once a week.  Pt stated she took tirzepatide [facility]) 2.5 MG/0.5ML Pen yesterday and again today because she got confused. No symptoms at this moment.  Pt seeking clinical advice.         Chief Complaint: double dose of Mounjaro. Symptoms: denies any sx of hypoglycemia  Frequency: yesterday and today Pertinent Negatives: Patient denies sx of hypoglycemia  Disposition: '[]'$ ED /'[]'$ Urgent Care (no appt availability in office) / '[]'$ Appointment(In office/virtual)/ '[]'$  [place] [facility]/ '[]'$ Home Care/ '[]'$ Refused Recommended Disposition /'[]'$ [place] [facility]/ '[]'$  Follow-up with PCP Additional Notes: Nurse, children's and spoke with [facility] representative. Provided Bethena Roys issue of double dose of Mounjaro.  Called pt and Judy warm transferred to pt. Discussed sx of hypoglycemia to watch out for and sent pt MyChart message with list of rescue foods/fluids and advised to call her PCP. Pt verbalized understanding.  Reason for Disposition  Diabetes drug error or overdose (e.g., took wrong type of insulin or took extra dose)  Answer Assessment - Initial Assessment Questions 1. NAME of MEDICINE: "What medicine(s) are you calling about?"     Mounjaro  2. QUESTION: "What is your question?" (e.g., double dose of medicine, side effect)     Double dose of medication 3. PRESCRIBER: "Who prescribed the medicine?" Reason: if prescribed by specialist, call should be referred to that group.     PCP 4. SYMPTOMS: "Do you have any symptoms?" If Yes, ask: "What symptoms are you having?"  "How bad are the symptoms (e.g., mild, moderate, severe)     none 5. PREGNANCY:  "Is there any chance that you are pregnant?" "When was your last menstrual period?"      N/a  Protocols used: Medication Question Call-A-AH

## 2022-08-23 DIAGNOSIS — M25661 Stiffness of right knee, not elsewhere classified: Secondary | ICD-10-CM | POA: Diagnosis not present

## 2022-08-23 DIAGNOSIS — R29898 Other symptoms and signs involving the musculoskeletal system: Secondary | ICD-10-CM | POA: Diagnosis not present

## 2022-08-23 DIAGNOSIS — M25561 Pain in right knee: Secondary | ICD-10-CM | POA: Diagnosis not present

## 2022-08-23 DIAGNOSIS — Z7409 Other reduced mobility: Secondary | ICD-10-CM | POA: Diagnosis not present

## 2022-08-23 DIAGNOSIS — Z9889 Other specified postprocedural states: Secondary | ICD-10-CM | POA: Diagnosis not present

## 2022-08-28 NOTE — Progress Notes (Signed)
TeleHealth Visit:  Due to the COVID-19 pandemic, this visit was completed with telemedicine (audio/video) technology to reduce patient and provider exposure as well as to preserve personal protective equipment.   [name] has verbally consented to this TeleHealth visit. The patient is located at home, the provider is located at the [facility] office. The participants in this visit include the listed provider and patient. The visit was conducted today via MyChart Video.   Chief Complaint: OBESITY Joy [name] is here to discuss her progress with her obesity treatment plan along with follow-up of her obesity related diagnoses. Joy [name] is on the Category 1 Plan and states she is following her eating plan approximately 100% of the time. Joy [name] states she is physical therapy/exercising 45 minutes 7 times per week.  Today's visit was #: 19 Starting weight: 268 lbs  Starting date: 12/16/2019  Interim History: Joy [name] was picking her sister up from lymph node biopsy today.  Voices she thinks she has been somewhat indulgent over the holidays.  Has been participating in physical therapy to work on quad strength.  Reports weight gain to 244 lbs at home.  Subjective:   1. Insulin resistance Insurance would not approve Victoza but would approve Byetta, Bydureon, Ozempic, Mounjaro.  2. Other iron deficiency anemia Joy [name] saw GI- EGD is planned.  She is to continue iron by mouth and PCP to order CBC and Iron studies.  Assessment/Plan:   1. Insulin resistance Stop Victoza. Start Mounjaro 2.5 mg SubQ once weekly for 1 month with 0 refills.  -Start tirzepatide [facility] 2.5 MG/0.5ML Pen; Inject 2.5 mg into the skin once a week.  Dispense: 2 mL; Refill: 0  2. Other iron deficiency anemia Follow-up with GI and PCP for above procedure and labs.  3. Obesity with current BMI of 41.2 Joy [name] is currently in the action stage of change. As such, her goal is to continue with  weight loss efforts. She has agreed to the Category 1 Plan and the [place].   Exercise goals: As is.  Behavioral modification strategies: increasing lean protein intake, meal planning and cooking strategies, keeping healthy foods in the home, and planning for success.  Joy [name] has agreed to follow-up with our clinic in 4 weeks. She was informed of the importance of frequent follow-up visits to maximize her success with intensive lifestyle modifications for her multiple health conditions.  Objective:   VITALS: Per patient if applicable, see vitals. GENERAL: Alert and in no acute distress. CARDIOPULMONARY: No increased WOB. Speaking in clear sentences.  PSYCH: Pleasant and cooperative. Speech normal rate and rhythm. Affect is appropriate. Insight and judgement are appropriate. Attention is focused, linear, and appropriate.  NEURO: Oriented as arrived to appointment on time with no prompting.   Lab Results  Component Value Date   CREATININE 0.88 06/30/2022   BUN 4 (L) 06/30/2022   NA 142 06/30/2022   K 3.7 06/30/2022   CL 102 06/30/2022   CO2 28 06/30/2022   Lab Results  Component Value Date   ALT 21 06/30/2022   AST 26 06/30/2022   ALKPHOS 78 06/21/2022   BILITOT 0.4 06/30/2022   Lab Results  Component Value Date   HGBA1C 5.1 01/11/2022   HGBA1C 5.3 09/20/2021   HGBA1C 5.4 05/24/2021   HGBA1C 5.3 02/07/2021   HGBA1C 5.3 09/28/2020   Lab Results  Component Value Date   INSULIN 14.1 01/11/2022   INSULIN 12.6 05/24/2021   INSULIN 7.1 02/07/2021   INSULIN 10.9 09/28/2020  INSULIN 24.0 04/08/2020   Lab Results  Component Value Date   TSH 2.100 12/16/2019   Lab Results  Component Value Date   CHOL 177 01/11/2022   HDL 45 01/11/2022   LDLCALC 118 (H) 01/11/2022   TRIG 73 01/11/2022   CHOLHDL 3.7 02/07/2021   Lab Results  Component Value Date   VD25OH 50.4 01/11/2022   VD25OH 58.3 05/24/2021   VD25OH 42.1 02/07/2021   Lab Results  Component Value  Date   WBC 10.8 06/30/2022   HGB 10.6 (L) 06/30/2022   HCT 31.9 (L) 06/30/2022   MCV 89.1 06/30/2022   PLT 339 06/30/2022   Lab Results  Component Value Date   IRON 30 (L) 07/03/2022   TIBC 216 (L) 07/03/2022   FERRITIN 270 (H) 07/03/2022    Attestation Statements:   Reviewed by clinician on day of visit: allergies, medications, problem list, medical history, surgical history, family history, social history, and previous encounter notes.  I, Elnora Morrison, RMA am acting as transcriptionist for Coralie Common, MD.  I have reviewed the above documentation for accuracy and completeness, and I agree with the above. - Coralie Common, MD

## 2022-08-30 DIAGNOSIS — Z7409 Other reduced mobility: Secondary | ICD-10-CM | POA: Diagnosis not present

## 2022-08-30 DIAGNOSIS — Z9889 Other specified postprocedural states: Secondary | ICD-10-CM | POA: Diagnosis not present

## 2022-08-30 DIAGNOSIS — M25561 Pain in right knee: Secondary | ICD-10-CM | POA: Diagnosis not present

## 2022-08-30 DIAGNOSIS — M25661 Stiffness of right knee, not elsewhere classified: Secondary | ICD-10-CM | POA: Diagnosis not present

## 2022-08-30 DIAGNOSIS — R29898 Other symptoms and signs involving the musculoskeletal system: Secondary | ICD-10-CM | POA: Diagnosis not present

## 2022-09-07 ENCOUNTER — Ambulatory Visit (INDEPENDENT_AMBULATORY_CARE_PROVIDER_SITE_OTHER): Payer: 59 | Admitting: Family Medicine

## 2022-09-07 ENCOUNTER — Encounter (INDEPENDENT_AMBULATORY_CARE_PROVIDER_SITE_OTHER): Payer: Self-pay | Admitting: Family Medicine

## 2022-09-07 ENCOUNTER — Encounter: Payer: Self-pay | Admitting: Gastroenterology

## 2022-09-07 ENCOUNTER — Ambulatory Visit (AMBULATORY_SURGERY_CENTER): Payer: 59 | Admitting: Gastroenterology

## 2022-09-07 VITALS — BP 130/80 | HR 85 | Temp 97.5°F | Resp 20 | Ht 64.0 in | Wt 244.0 lb

## 2022-09-07 VITALS — BP 126/85 | HR 88 | Temp 98.3°F | Ht 64.0 in | Wt 240.0 lb

## 2022-09-07 DIAGNOSIS — R0602 Shortness of breath: Secondary | ICD-10-CM

## 2022-09-07 DIAGNOSIS — E7849 Other hyperlipidemia: Secondary | ICD-10-CM

## 2022-09-07 DIAGNOSIS — D649 Anemia, unspecified: Secondary | ICD-10-CM | POA: Diagnosis not present

## 2022-09-07 DIAGNOSIS — D509 Iron deficiency anemia, unspecified: Secondary | ICD-10-CM | POA: Diagnosis not present

## 2022-09-07 DIAGNOSIS — J45909 Unspecified asthma, uncomplicated: Secondary | ICD-10-CM | POA: Diagnosis not present

## 2022-09-07 DIAGNOSIS — Z6841 Body Mass Index (BMI) 40.0 and over, adult: Secondary | ICD-10-CM

## 2022-09-07 DIAGNOSIS — K2970 Gastritis, unspecified, without bleeding: Secondary | ICD-10-CM | POA: Diagnosis not present

## 2022-09-07 DIAGNOSIS — E559 Vitamin D deficiency, unspecified: Secondary | ICD-10-CM

## 2022-09-07 DIAGNOSIS — K2950 Unspecified chronic gastritis without bleeding: Secondary | ICD-10-CM | POA: Diagnosis not present

## 2022-09-07 DIAGNOSIS — E669 Obesity, unspecified: Secondary | ICD-10-CM

## 2022-09-07 DIAGNOSIS — K2980 Duodenitis without bleeding: Secondary | ICD-10-CM

## 2022-09-07 DIAGNOSIS — I10 Essential (primary) hypertension: Secondary | ICD-10-CM | POA: Diagnosis not present

## 2022-09-07 DIAGNOSIS — E88819 Insulin resistance, unspecified: Secondary | ICD-10-CM

## 2022-09-07 DIAGNOSIS — R1013 Epigastric pain: Secondary | ICD-10-CM

## 2022-09-07 MED ORDER — SODIUM CHLORIDE 0.9 % IV SOLN: 500.0000 mL | Freq: Once | INTRAVENOUS | Status: DC

## 2022-09-07 MED ORDER — TIRZEPATIDE 2.5 MG/0.5ML ~~LOC~~ SOAJ: 2.5000 mg | SUBCUTANEOUS | 0 refills | Status: DC

## 2022-09-07 NOTE — Progress Notes (Signed)
Indications for EGD: iron deficiency anemia; recent epigastric pain, nausea and vomiting  Please see the 08/09/2022 office note for complete details.  There is been no change in history or physical exam since that time.  The patient remains an appropriate candidate for monitored anesthesia care in the endoscopy center today.

## 2022-09-07 NOTE — Progress Notes (Signed)
Pt's states no medical or surgical changes since previsit or office visit. VS assessed by D.T

## 2022-09-07 NOTE — Progress Notes (Signed)
Report to PACU, RN, vss, BBS= Clear.  

## 2022-09-07 NOTE — Patient Instructions (Signed)
YOU HAD AN ENDOSCOPIC PROCEDURE TODAY AT THE [facility]:   Refer to the procedure report that was given to you for any specific questions about what was found during the examination.  If the procedure report does not answer your questions, please call your gastroenterologist to clarify.  If you requested that your care partner not be given the details of your procedure findings, then the procedure report has been included in a sealed envelope for you to review at your convenience later.  YOU SHOULD EXPECT: Some feelings of bloating in the abdomen. Passage of more gas than usual.  Walking can help get rid of the air that was put into your GI tract during the procedure and reduce the bloating. If you had a lower endoscopy (such as a colonoscopy or flexible sigmoidoscopy) you may notice spotting of blood in your stool or on the toilet paper. If you underwent a bowel prep for your procedure, you may not have a normal bowel movement for a few days.  Please Note:  You might notice some irritation and congestion in your nose or some drainage.  This is from the oxygen used during your procedure.  There is no need for concern and it should clear up in a day or so.  SYMPTOMS TO REPORT IMMEDIATELY:   Following upper endoscopy (EGD)  Vomiting of blood or coffee ground material  New chest pain or pain under the shoulder blades  Painful or persistently difficult swallowing  New shortness of breath  Fever of 100F or higher  Black, tarry-looking stools  For urgent or emergent issues, a gastroenterologist can be reached at any hour by calling [phone]. Do not use MyChart messaging for urgent concerns.    DIET:  We do recommend a small meal at first, but then you may proceed to your regular diet.  Drink plenty of fluids but you should avoid alcoholic beverages for 24 hours.  ACTIVITY:  You should plan to take it easy for the rest of today and you should NOT DRIVE or use heavy machinery  until tomorrow (because of the sedation medicines used during the test).    FOLLOW UP: Our staff will call the number listed on your records the next business day following your procedure.  We will call around 7:15- 8:00 am to check on you and address any questions or concerns that you may have regarding the information given to you following your procedure. If we do not reach you, we will leave a message.     If any biopsies were taken you will be contacted by phone or by letter within the next 1-3 weeks.  Please call us at [phone] if you have not heard about the biopsies in 3 weeks.    SIGNATURES/CONFIDENTIALITY: You and/or your care partner have signed paperwork which will be entered into your electronic medical record.  These signatures attest to the fact that that the information above on your After Visit Summary has been reviewed and is understood.  Full responsibility of the confidentiality of this discharge information lies with you and/or your care-partner. 

## 2022-09-07 NOTE — Op Note (Signed)
[place] Patient Name: Joy [name] Procedure Date: 09/07/2022 9:33 AM MRN: [id number] Endoscopist: Thornton Park MD, MD, [id number] Age: 57 Referring MD:  Date of Birth: 04-25-1966 Gender: Female Account #: [id number] Procedure:                Upper GI endoscopy Indications:              Abdominal pain, Unexplained iron deficiency anemia,                            Nausea with vomiting Medicines:                Monitored Anesthesia Care Procedure:                Pre-Anesthesia Assessment:                           - Prior to the procedure, a History and Physical                            was performed, and patient medications and                            allergies were reviewed. The patient's tolerance of                            previous anesthesia was also reviewed. The risks                            and benefits of the procedure and the sedation                            options and risks were discussed with the patient.                            All questions were answered, and informed consent                            was obtained. Prior Anticoagulants: The patient has                            taken no anticoagulant or antiplatelet agents. ASA                            Grade Assessment: III - A patient with severe                            systemic disease. After reviewing the risks and                            benefits, the patient was deemed in satisfactory                            condition to undergo the procedure.  After obtaining informed consent, the endoscope was                            passed under direct vision. Throughout the                            procedure, the patient's blood pressure, pulse, and                            oxygen saturations were monitored continuously. The                            Endoscope was introduced through the mouth, and                            advanced to the  third part of duodenum. The upper                            GI endoscopy was accomplished without difficulty.                            The patient tolerated the procedure well. Scope In: Scope Out: Findings:                 The examined esophagus was normal.                           Food (residue) was found in the cardia and gastric                            body.                           The entire examined gastric mucosa was normal.                            Biopsies were taken from the antrum, body, and                            fundus with a cold forceps for histology. Estimated                            blood loss was minimal.                           Possible deformity around the major papilla.                            Submucosal apperance of fullness. Overlying mucosa                            appears normal. Negative pillow sign. Biopsies were                            taken with a cold forceps for histology.  Estimated                            blood loss was minimal.                           The examined duodenum was otherwise normal.                            Biopsies were taken with a cold forceps for                            histology. Estimated blood loss was minimal.                           The cardia and gastric fundus were normal on                            retroflexion except for a small hiatal hernia.                           The exam was otherwise. Complications:            No immediate complications. Estimated Blood Loss:     Estimated blood loss was minimal. Impression:               - Normal esophagus.                           - Food (residue) in the stomach.                           - Normal stomach. Biopsied.                           - Possible periampullary duodenal deformity.                            Biopsied.                           - Normal examined duodenum. Biopsied for celiac in                            the setting of iron  deficiency.                           - The examination was otherwise normal. Recommendation:           - Patient has a contact number available for                            emergencies. The signs and symptoms of potential                            delayed complications were discussed with the  patient. Return to normal activities tomorrow.                            Written discharge instructions were provided to the                            patient.                           - Resume previous diet.                           - Continue present medications.                           - Await pathology results.                           - Office follow-up. Thornton Park MD, MD 09/07/2022 9:55:11 AM This report has been signed electronically.

## 2022-09-08 ENCOUNTER — Telehealth: Payer: Self-pay | Admitting: *Deleted

## 2022-09-08 LAB — HEMOGLOBIN A1C
Est. average glucose Bld gHb Est-mCnc: 105 mg/dL
Hgb A1c MFr Bld: 5.3 % (ref 4.8–5.6)

## 2022-09-08 LAB — ANEMIA PANEL
Ferritin: 589 ng/mL — ABNORMAL HIGH (ref 15–150)
Folate, Hemolysate: 244 ng/mL
Folate, RBC: 742 ng/mL (ref >498)
Hematocrit: 32.9 % — ABNORMAL LOW (ref 34.0–46.6)
Iron Saturation: 18 % (ref 15–55)
Iron: 38 ug/dL (ref 27–159)
Retic Ct Pct: 1.2 % (ref 0.6–2.6)
Total Iron Binding Capacity: 215 ug/dL — ABNORMAL LOW (ref 250–450)
UIBC: 177 ug/dL (ref 131–425)
Vitamin B-12: 227 pg/mL — ABNORMAL LOW (ref 232–1245)

## 2022-09-08 LAB — LIPID PANEL WITH LDL/HDL RATIO
Cholesterol, Total: 184 mg/dL (ref 100–199)
HDL: 43 mg/dL (ref >39)
LDL Chol Calc (NIH): 126 mg/dL — ABNORMAL HIGH (ref 0–99)
LDL/HDL Ratio: 2.9 ratio (ref 0.0–3.2)
Triglycerides: 79 mg/dL (ref 0–149)
VLDL Cholesterol Cal: 15 mg/dL (ref 5–40)

## 2022-09-08 LAB — COMPREHENSIVE METABOLIC PANEL
ALT: 7 IU/L (ref 0–32)
AST: 19 IU/L (ref 0–40)
Albumin/Globulin Ratio: 1.6 (ref 1.2–2.2)
Albumin: 3.9 g/dL (ref 3.8–4.9)
Alkaline Phosphatase: 95 IU/L (ref 44–121)
BUN/Creatinine Ratio: 8 — ABNORMAL LOW (ref 9–23)
BUN: 7 mg/dL (ref 6–24)
Bilirubin Total: 0.3 mg/dL (ref 0.0–1.2)
CO2: 22 mmol/L (ref 20–29)
Calcium: 9 mg/dL (ref 8.7–10.2)
Chloride: 107 mmol/L — ABNORMAL HIGH (ref 96–106)
Creatinine, Ser: 0.89 mg/dL (ref 0.57–1.00)
Globulin, Total: 2.4 g/dL (ref 1.5–4.5)
Glucose: 76 mg/dL (ref 70–99)
Potassium: 3.9 mmol/L (ref 3.5–5.2)
Sodium: 144 mmol/L (ref 134–144)
Total Protein: 6.3 g/dL (ref 6.0–8.5)
eGFR: 76 mL/min/{1.73_m2} (ref >59)

## 2022-09-08 LAB — CBC
Hemoglobin: 10.4 g/dL — ABNORMAL LOW (ref 11.1–15.9)
MCH: 29.1 pg (ref 26.6–33.0)
MCHC: 31.6 g/dL (ref 31.5–35.7)
MCV: 92 fL (ref 79–97)
Platelets: 299 10*3/uL (ref 150–450)
RBC: 3.57 x10E6/uL — ABNORMAL LOW (ref 3.77–5.28)
RDW: 12.3 % (ref 11.7–15.4)
WBC: 6.3 10*3/uL (ref 3.4–10.8)

## 2022-09-08 LAB — VITAMIN D 25 HYDROXY (VIT D DEFICIENCY, FRACTURES): Vit D, 25-Hydroxy: 50.6 ng/mL (ref 30.0–100.0)

## 2022-09-08 LAB — INSULIN, RANDOM: INSULIN: 6.9 u[IU]/mL (ref 2.6–24.9)

## 2022-09-08 NOTE — Telephone Encounter (Signed)
No answer for post procedure followup. Left Vm.

## 2022-09-11 DIAGNOSIS — M159 Polyosteoarthritis, unspecified: Secondary | ICD-10-CM | POA: Diagnosis not present

## 2022-09-11 DIAGNOSIS — M0609 Rheumatoid arthritis without rheumatoid factor, multiple sites: Secondary | ICD-10-CM | POA: Diagnosis not present

## 2022-09-11 DIAGNOSIS — Z79899 Other long term (current) drug therapy: Secondary | ICD-10-CM | POA: Diagnosis not present

## 2022-09-19 NOTE — Progress Notes (Signed)
Chief Complaint:   OBESITY Joy [name] is here to discuss her progress with her obesity treatment plan along with follow-up of her obesity related diagnoses. Joy [name] is on the Category 1 Plan and states she is following her eating plan approximately 90% of the time. Joy [name] states she is going to gym 45 minutes 5 times per week.  Today's visit was #: 44 Starting weight: 268 lbs Starting date: 12/16/2019 Today's weight: 240 lbs Today's date: 09/07/2022 Total lbs lost to date: 28 lbs Total lbs lost since last in-office visit: 0  Interim History: Joy [name] voices she has been following category 1 very strictly.  She has not really been hungry on category 1.  Had any surgery recently.  Subjective:   1. SOBOE (shortness of breath on exertion) Initial ICD 1483 on 04/25/2021.  Symptoms improved since that time.  2. Vitamin D deficiency Joy [name] is on over-the-counter vitamin D.  Denies any nausea, vomiting or muscle weakness.  She notes fatigue.  3. Insulin resistance Better/improved carb craving.  No GI side effects of Mounjaro.  4. Other hyperlipidemia On Lipitor with no myalgias or transaminitis.  5. Essential hypertension Blood pressure well-controlled today.  Denies chest pain, chest pressure and headache.  6. Anemia, unspecified type On Ferrous sulfate.  Last Iron decrease, last H/H low.  Assessment/Plan:   1. SOBOE (shortness of breath on exertion) IC today 1742, increase to Category 3.  2. Vitamin D deficiency We will obtain labs today.  - VITAMIN D 25 Hydroxy (Vit-D Deficiency, Fractures)  3. Insulin resistance We will obtain labs today.  Will refill Mounjaro 2.5 mg subcu once a week for 1 month with 0 refills.  -Refill tirzepatide (MOUNJARO) 2.5 MG/0.5ML Pen; Inject 2.5 mg into the skin once a week.  Dispense: 2 mL; Refill: 0  - Hemoglobin A1c - Insulin, random  4. Other hyperlipidemia We will obtain labs today.  - Lipid Panel With LDL/HDL  Ratio  5. Essential hypertension We will obtain labs today.  - Comprehensive metabolic panel  6. Anemia, unspecified type We will obtain labs today.  - CBC - Anemia panel  7. BMI 40.0-44.9, adult (HCC) Joy [name] is currently in the action stage of change. As such, her goal is to continue with weight loss efforts. She has agreed to the Category 3 Plan.   Exercise goals: No exercise has been prescribed at this time.  Behavioral modification strategies: increasing lean protein intake, meal planning and cooking strategies, keeping healthy foods in the home, and planning for success.  Joy [name] has agreed to follow-up with our clinic in 3 weeks. She was informed of the importance of frequent follow-up visits to maximize her success with intensive lifestyle modifications for her multiple health conditions.   Joy [name] was informed we would discuss her lab results at her next visit unless there is a critical issue that needs to be addressed sooner. Joy [name] agreed to keep her next visit at the agreed upon time to discuss these results.  Objective:   Blood pressure 126/85, pulse 88, temperature 98.3 F (36.8 C), height 5' 4"$  (1.626 m), weight 240 lb (108.9 kg), SpO2 99 %. Body mass index is 41.2 kg/m.  General: Cooperative, alert, well developed, in no acute distress. HEENT: Conjunctivae and lids unremarkable. Cardiovascular: Regular rhythm.  Lungs: Normal work of breathing. Neurologic: No focal deficits.   Lab Results  Component Value Date   CREATININE 0.89 09/07/2022   BUN 7 09/07/2022   NA 144 09/07/2022   K 3.9 09/07/2022  CL 107 (H) 09/07/2022   CO2 22 09/07/2022   Lab Results  Component Value Date   ALT 7 09/07/2022   AST 19 09/07/2022   ALKPHOS 95 09/07/2022   BILITOT 0.3 09/07/2022   Lab Results  Component Value Date   HGBA1C 5.3 09/07/2022   HGBA1C 5.1 01/11/2022   HGBA1C 5.3 09/20/2021   HGBA1C 5.4 05/24/2021   HGBA1C 5.3 02/07/2021   Lab Results   Component Value Date   INSULIN 6.9 09/07/2022   INSULIN 14.1 01/11/2022   INSULIN 12.6 05/24/2021   INSULIN 7.1 02/07/2021   INSULIN 10.9 09/28/2020   Lab Results  Component Value Date   TSH 2.100 12/16/2019   Lab Results  Component Value Date   CHOL 184 09/07/2022   HDL 43 09/07/2022   LDLCALC 126 (H) 09/07/2022   TRIG 79 09/07/2022   CHOLHDL 3.7 02/07/2021   Lab Results  Component Value Date   VD25OH 50.6 09/07/2022   VD25OH 50.4 01/11/2022   VD25OH 58.3 05/24/2021   Lab Results  Component Value Date   WBC 6.3 09/07/2022   HGB 10.4 (L) 09/07/2022   HCT 32.9 (L) 09/07/2022   MCV 92 09/07/2022   PLT 299 09/07/2022   Lab Results  Component Value Date   IRON 38 09/07/2022   TIBC 215 (L) 09/07/2022   FERRITIN 589 (H) 09/07/2022   Attestation Statements:   Reviewed by clinician on day of visit: allergies, medications, problem list, medical history, surgical history, family history, social history, and previous encounter notes.  I, Elnora Morrison, RMA am acting as transcriptionist for Coralie Common, MD.  I have reviewed the above documentation for accuracy and completeness, and I agree with the above. - Coralie Common, MD

## 2022-09-20 ENCOUNTER — Ambulatory Visit (INDEPENDENT_AMBULATORY_CARE_PROVIDER_SITE_OTHER): Payer: 59 | Admitting: Family

## 2022-09-20 ENCOUNTER — Telehealth: Payer: Self-pay | Admitting: Family

## 2022-09-20 VITALS — BP 109/67 | HR 82 | Temp 97.8°F | Resp 16 | Wt 246.0 lb

## 2022-09-20 DIAGNOSIS — R7303 Prediabetes: Secondary | ICD-10-CM

## 2022-09-20 DIAGNOSIS — M069 Rheumatoid arthritis, unspecified: Secondary | ICD-10-CM | POA: Diagnosis not present

## 2022-09-20 DIAGNOSIS — I10 Essential (primary) hypertension: Secondary | ICD-10-CM | POA: Diagnosis not present

## 2022-09-20 DIAGNOSIS — E785 Hyperlipidemia, unspecified: Secondary | ICD-10-CM | POA: Diagnosis not present

## 2022-09-20 DIAGNOSIS — K219 Gastro-esophageal reflux disease without esophagitis: Secondary | ICD-10-CM | POA: Diagnosis not present

## 2022-09-20 DIAGNOSIS — E7849 Other hyperlipidemia: Secondary | ICD-10-CM | POA: Diagnosis not present

## 2022-09-20 DIAGNOSIS — Z6841 Body Mass Index (BMI) 40.0 and over, adult: Secondary | ICD-10-CM

## 2022-09-20 DIAGNOSIS — E538 Deficiency of other specified B group vitamins: Secondary | ICD-10-CM | POA: Insufficient documentation

## 2022-09-20 DIAGNOSIS — D510 Vitamin B12 deficiency anemia due to intrinsic factor deficiency: Secondary | ICD-10-CM | POA: Diagnosis not present

## 2022-09-20 DIAGNOSIS — J4540 Moderate persistent asthma, uncomplicated: Secondary | ICD-10-CM | POA: Diagnosis not present

## 2022-09-20 MED ORDER — OMEPRAZOLE 40 MG PO CPDR: 40.0000 mg | DELAYED_RELEASE_CAPSULE | Freq: Every day | ORAL | 1 refills | Status: DC

## 2022-09-20 MED ORDER — CYANOCOBALAMIN 1000 MCG/ML IJ SOLN
1000.0000 ug | Freq: Once | INTRAMUSCULAR | Status: AC
  Administered 2022-09-20: 1000 ug via INTRAMUSCULAR

## 2022-09-20 MED ORDER — ATORVASTATIN CALCIUM 20 MG PO TABS: ORAL_TABLET | ORAL | 1 refills | Status: AC

## 2022-09-20 NOTE — Assessment & Plan Note (Signed)
BP Readings from Last 3 Encounters:  09/20/22 109/67  09/07/22 126/85  09/07/22 130/80   Maintained on lisinopril '5mg'$  once daily. BP stable.

## 2022-09-20 NOTE — Telephone Encounter (Signed)
Last covd 04/29/2021 Last flu shot 05/25/2022 records updated
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
## 2022-09-20 NOTE — Assessment & Plan Note (Signed)
Clinically stable. Management per rheumatology.

## 2022-09-20 NOTE — Assessment & Plan Note (Signed)
Lab Results  Component Value Date   CHOL 184 09/07/2022   HDL 43 09/07/2022   LDLCALC 126 (H) 09/07/2022   TRIG 79 09/07/2022   CHOLHDL 3.7 02/07/2021   Stable on atorvastatin. Continue same.

## 2022-09-20 NOTE — Addendum Note (Signed)
Addended by: Jiles Prows on: 09/20/2022 10:16 AM   Modules accepted: Orders

## 2022-09-20 NOTE — Assessment & Plan Note (Signed)
Stable with prn use of omeprazole.

## 2022-09-20 NOTE — Assessment & Plan Note (Signed)
IFOB was negative. Had endoscopy recently with GI which noted gastritis/duodenitis. Recommended that she continue omeprazole and avoid NSAIDS.

## 2022-09-20 NOTE — Assessment & Plan Note (Signed)
Lab Results  Component Value Date   HGBA1C 5.3 09/07/2022

## 2022-09-20 NOTE — Telephone Encounter (Signed)
Can you please call [facility] and request dates of flu shot and covid vaccine?

## 2022-09-20 NOTE — Assessment & Plan Note (Signed)
Low at outside visit. Restart b12 injections weekly x 4 weeks, then monthly.

## 2022-09-20 NOTE — Assessment & Plan Note (Signed)
Wt Readings from Last 3 Encounters:  09/20/22 246 lb (111.6 kg)  09/07/22 240 lb (108.9 kg)  09/07/22 244 lb (110.7 kg)   Just started mounjaro- denies side effects.  This is being rx'd by [facility].

## 2022-09-20 NOTE — Assessment & Plan Note (Signed)
Stable with prn albuterol.

## 2022-09-24 ENCOUNTER — Encounter: Payer: Self-pay | Admitting: Gastroenterology

## 2022-09-27 ENCOUNTER — Ambulatory Visit: Payer: 59

## 2022-09-28 ENCOUNTER — Ambulatory Visit (INDEPENDENT_AMBULATORY_CARE_PROVIDER_SITE_OTHER): Payer: 59 | Admitting: Family Medicine

## 2022-09-28 ENCOUNTER — Encounter (INDEPENDENT_AMBULATORY_CARE_PROVIDER_SITE_OTHER): Payer: Self-pay | Admitting: Family Medicine

## 2022-09-28 VITALS — BP 109/74 | HR 76 | Temp 97.5°F | Ht 64.0 in | Wt 239.0 lb

## 2022-09-28 DIAGNOSIS — Z6841 Body Mass Index (BMI) 40.0 and over, adult: Secondary | ICD-10-CM

## 2022-09-28 DIAGNOSIS — E88819 Insulin resistance, unspecified: Secondary | ICD-10-CM | POA: Diagnosis not present

## 2022-09-28 DIAGNOSIS — I10 Essential (primary) hypertension: Secondary | ICD-10-CM | POA: Diagnosis not present

## 2022-09-28 DIAGNOSIS — E669 Obesity, unspecified: Secondary | ICD-10-CM

## 2022-09-28 MED ORDER — TIRZEPATIDE 5 MG/0.5ML ~~LOC~~ SOAJ: 5.0000 mg | SUBCUTANEOUS | 0 refills | Status: DC

## 2022-09-28 NOTE — Progress Notes (Signed)
Patient has started taking Mounjaro and noticing increased hunger.  Sometimes there are drops of liquid on her skin and sometimes there isn't.  Patient is going to [place] this weekend for a friend's birthday party.  She is noticing more hunger specifically at night.  She has eaten the full amount of 8-10 oz at supper.  For snacks she is eating protein bar, celery, carrots, peaches or fresh berries.  Activity wise she is going to the [facility] Monday thru Friday and gets on the bike.  She is doing around 45 minutes.  Hasn't started weights yet.

## 2022-10-04 ENCOUNTER — Ambulatory Visit (INDEPENDENT_AMBULATORY_CARE_PROVIDER_SITE_OTHER): Payer: 59

## 2022-10-04 DIAGNOSIS — E538 Deficiency of other specified B group vitamins: Secondary | ICD-10-CM | POA: Diagnosis not present

## 2022-10-04 MED ORDER — CYANOCOBALAMIN 1000 MCG/ML IJ SOLN
1000.0000 ug | Freq: Once | INTRAMUSCULAR | Status: AC
  Administered 2022-10-04: 1000 ug via INTRAMUSCULAR

## 2022-10-04 NOTE — Progress Notes (Signed)
Pt here for weekly B12 injection per Debbrah Alar NP  B12 1045mg given IM left deltoid, and pt tolerated injection well.  Next B12 injection scheduled for 10/11/2022

## 2022-10-04 NOTE — Addendum Note (Signed)
Addended by: Sena Hitch on: 10/04/2022 09:13 AM   Modules accepted: Orders

## 2022-10-09 NOTE — Progress Notes (Signed)
Chief Complaint:   OBESITY Joy [name] is here to discuss her progress with her obesity treatment plan along with follow-up of her obesity related diagnoses. [name] is on the Category 3 Plan and states she is following her eating plan approximately 90-95% of the time. Rosell states she is bicycling 45 minutes 5 times per week.  Today's visit was #: 99 Starting weight: 268 lbs Starting date: 12/16/2019 Today's weight: 239 lbs Today's date: 09/28/2022 Total lbs lost to date: 29 lbs Total lbs lost since last in-office visit: 1  Interim History: [name] has started taking Mounjaro and noticing increased hunger. Sometimes there are drops of liquid on her skin and sometimes there isn't. Patient is going to [place] this weekend for a friend's birthday party. She is noticing more hunger specifically at night. She has eaten the full amount of 8-10 oz at supper. For snacks she is eating protein bar, celery, carrots, peaches or fresh berries. Activity wise she is going to the [facility] Monday thru Friday and gets on the bike. She is doing around 45 minutes. Hasn't started weights yet.   Subjective:   1. Essential hypertension Blood pressure well controlled today.  Denies chest pain, chest pressure and headache.  2. Insulin resistance Started on Mounjaro 2.5 mg.  Significant increase in hunger.  Assessment/Plan:   1. Essential hypertension Continue with current medications without changes in medication or dose.  2. Insulin resistance Will increase Mounjaro to 5 mg subcu once a week for 1 month with 0 refills.  -Increase/refill tirzepatide (MOUNJARO) 5 MG/0.5ML Pen; Inject 5 mg into the skin once a week.  Dispense: 2 mL; Refill: 0  3. BMI 40.0-44.9, adult ([place])   Obesity with starting BMI of 46.0 [name] is currently in the action stage of change. As such, her goal is to continue with weight loss efforts. She has agreed to the Category 3 Plan.   Exercise goals: All adults should  avoid inactivity. Some physical activity is better than none, and adults who participate in any amount of physical activity gain some health benefits.  Behavioral modification strategies: increasing lean protein intake, meal planning and cooking strategies, keeping healthy foods in the home, and planning for success.  [name] has agreed to follow-up with our clinic in 4 weeks. She was informed of the importance of frequent follow-up visits to maximize her success with intensive lifestyle modifications for her multiple health conditions.   Objective:   Blood pressure 109/74, pulse 76, temperature (!) 97.5 F (36.4 C), height '5\' 4"'$  (1.626 m), weight 239 lb (108.4 kg), SpO2 99 %. Body mass index is 41.02 kg/m.  General: Cooperative, alert, well developed, in no acute distress. HEENT: Conjunctivae and lids unremarkable. Cardiovascular: Regular rhythm.  Lungs: Normal work of breathing. Neurologic: No focal deficits.   Lab Results  Component Value Date   CREATININE 0.89 09/07/2022   BUN 7 09/07/2022   NA 144 09/07/2022   K 3.9 09/07/2022   CL 107 (H) 09/07/2022   CO2 22 09/07/2022   Lab Results  Component Value Date   ALT 7 09/07/2022   AST 19 09/07/2022   ALKPHOS 95 09/07/2022   BILITOT 0.3 09/07/2022   Lab Results  Component Value Date   HGBA1C 5.3 09/07/2022   HGBA1C 5.1 01/11/2022   HGBA1C 5.3 09/20/2021   HGBA1C 5.4 05/24/2021   HGBA1C 5.3 02/07/2021   Lab Results  Component Value Date   INSULIN 6.9 09/07/2022   INSULIN 14.1 01/11/2022   INSULIN 12.6 05/24/2021  INSULIN 7.1 02/07/2021   INSULIN 10.9 09/28/2020   Lab Results  Component Value Date   TSH 2.100 12/16/2019   Lab Results  Component Value Date   CHOL 184 09/07/2022   HDL 43 09/07/2022   LDLCALC 126 (H) 09/07/2022   TRIG 79 09/07/2022   CHOLHDL 3.7 02/07/2021   Lab Results  Component Value Date   VD25OH 50.6 09/07/2022   VD25OH 50.4 01/11/2022   VD25OH 58.3 05/24/2021   Lab Results   Component Value Date   WBC 6.3 09/07/2022   HGB 10.4 (L) 09/07/2022   HCT 32.9 (L) 09/07/2022   MCV 92 09/07/2022   PLT 299 09/07/2022   Lab Results  Component Value Date   IRON 38 09/07/2022   TIBC 215 (L) 09/07/2022   FERRITIN 589 (H) 09/07/2022   Attestation Statements:   Reviewed by clinician on day of visit: allergies, medications, problem list, medical history, surgical history, family history, social history, and previous encounter notes.  I, Elnora Morrison, RMA am acting as transcriptionist for Coralie Common, MD.  I have reviewed the above documentation for accuracy and completeness, and I agree with the above. - Coralie Common, MD

## 2022-10-11 ENCOUNTER — Ambulatory Visit (INDEPENDENT_AMBULATORY_CARE_PROVIDER_SITE_OTHER): Payer: 59

## 2022-10-11 DIAGNOSIS — E538 Deficiency of other specified B group vitamins: Secondary | ICD-10-CM

## 2022-10-11 MED ORDER — CYANOCOBALAMIN 1000 MCG/ML IJ SOLN
1000.0000 ug | Freq: Once | INTRAMUSCULAR | Status: AC
  Administered 2022-10-11: 1000 ug via INTRAMUSCULAR

## 2022-10-11 NOTE — Progress Notes (Signed)
Pt here for weekly B12 injection per Debbrah Alar NP  B12 1013mg given IM left deltoid, and pt tolerated injection well.  Next B12 injection scheduled for

## 2022-10-17 ENCOUNTER — Other Ambulatory Visit (INDEPENDENT_AMBULATORY_CARE_PROVIDER_SITE_OTHER): Payer: 59

## 2022-10-17 ENCOUNTER — Encounter: Payer: Self-pay | Admitting: Gastroenterology

## 2022-10-17 ENCOUNTER — Ambulatory Visit (INDEPENDENT_AMBULATORY_CARE_PROVIDER_SITE_OTHER): Payer: 59 | Admitting: Gastroenterology

## 2022-10-17 VITALS — BP 112/62 | HR 88 | Ht 64.25 in | Wt 251.2 lb

## 2022-10-17 DIAGNOSIS — R1013 Epigastric pain: Secondary | ICD-10-CM | POA: Diagnosis not present

## 2022-10-17 DIAGNOSIS — R857 Abnormal histological findings in specimens from digestive organs and abdominal cavity: Secondary | ICD-10-CM | POA: Diagnosis not present

## 2022-10-17 DIAGNOSIS — D509 Iron deficiency anemia, unspecified: Secondary | ICD-10-CM

## 2022-10-17 LAB — BASIC METABOLIC PANEL
BUN: 12 mg/dL (ref 6–23)
CO2: 29 mEq/L (ref 19–32)
Calcium: 9.3 mg/dL (ref 8.4–10.5)
Chloride: 105 mEq/L (ref 96–112)
Creatinine, Ser: 0.98 mg/dL (ref 0.40–1.20)
GFR: 64.44 mL/min (ref >60.00)
Glucose, Bld: 81 mg/dL (ref 70–99)
Potassium: 4.2 mEq/L (ref 3.5–5.1)
Sodium: 141 mEq/L (ref 135–145)

## 2022-10-17 MED ORDER — OMEPRAZOLE 40 MG PO CPDR: 40.0000 mg | DELAYED_RELEASE_CAPSULE | Freq: Two times a day (BID) | ORAL | 1 refills | Status: AC

## 2022-10-17 MED ORDER — DICYCLOMINE HCL 10 MG PO CAPS: 10.0000 mg | ORAL_CAPSULE | Freq: Three times a day (TID) | ORAL | 3 refills | Status: AC

## 2022-10-17 NOTE — Patient Instructions (Signed)
Your provider has requested that you go to the basement level for lab work before leaving today. Press "B" on the elevator. The lab is located at the first door on the left as you exit the elevator.  We have sent the following medications to your pharmacy for you to pick up at your convenience: Dicyclomine 10 mg four times daily before meals and bedtime for abdominal pain.  Omeprazole 40 mg twice daily 30-60 minutes before breakfast and dinner.   You have been scheduled for a CT scan of the abdomen and pelvis at [facility], 1st floor Radiology. You are scheduled on Wednesday 11/01/22 at 2 pm. You should arrive 15 minutes prior to your appointment time for registration.  We are giving you 2 bottles of contrast today that you will need to drink before arriving for the exam. The solution may taste better if refrigerated so put them in the refrigerator when you get home, but do NOT add ice or any other liquid to this solution as that would dilute it. Shake well before drinking.   Please follow the written instructions below on the day of your exam:   1) Do not eat anything after 12 pm (4 hours prior to your test)   You may take any medications as prescribed with a small amount of water, if necessary. If you take any of the following medications: METFORMIN, GLUCOPHAGE, GLUCOVANCE, AVANDAMET, RIOMET, FORTAMET, [place] MET, JANUMET, GLUMETZA or METAGLIP, you MAY be asked to HOLD this medication 48 hours AFTER the exam.   The purpose of you drinking the oral contrast is to aid in the visualization of your intestinal tract. The contrast solution may cause some diarrhea. Depending on your individual set of symptoms, you may also receive an intravenous injection of x-ray contrast/dye. Plan on being at [facility] for 45 minutes or longer, depending on the type of exam you are having performed.   If you have any questions regarding your exam or if you need to reschedule, you may call [facility] at [phone] between the hours of 8:00 am and 5:00 pm, Monday-Friday.
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
## 2022-10-23 ENCOUNTER — Other Ambulatory Visit: Payer: Self-pay | Admitting: Family

## 2022-10-23 DIAGNOSIS — D649 Anemia, unspecified: Secondary | ICD-10-CM

## 2022-10-24 ENCOUNTER — Inpatient Hospital Stay: Payer: 59 | Attending: Hematology & Oncology

## 2022-10-24 ENCOUNTER — Encounter: Payer: Self-pay | Admitting: Family

## 2022-10-24 ENCOUNTER — Inpatient Hospital Stay (HOSPITAL_BASED_OUTPATIENT_CLINIC_OR_DEPARTMENT_OTHER): Payer: 59 | Admitting: Family

## 2022-10-24 VITALS — BP 124/72 | HR 81 | Temp 98.1°F | Resp 17 | Ht 64.5 in | Wt 249.0 lb

## 2022-10-24 DIAGNOSIS — Z83719 Family history of colon polyps, unspecified: Secondary | ICD-10-CM

## 2022-10-24 DIAGNOSIS — Z83511 Family history of glaucoma: Secondary | ICD-10-CM | POA: Diagnosis not present

## 2022-10-24 DIAGNOSIS — Z6841 Body Mass Index (BMI) 40.0 and over, adult: Secondary | ICD-10-CM

## 2022-10-24 DIAGNOSIS — Z833 Family history of diabetes mellitus: Secondary | ICD-10-CM

## 2022-10-24 DIAGNOSIS — M069 Rheumatoid arthritis, unspecified: Secondary | ICD-10-CM | POA: Diagnosis not present

## 2022-10-24 DIAGNOSIS — Z8661 Personal history of infections of the central nervous system: Secondary | ICD-10-CM | POA: Diagnosis not present

## 2022-10-24 DIAGNOSIS — Z823 Family history of stroke: Secondary | ICD-10-CM | POA: Diagnosis not present

## 2022-10-24 DIAGNOSIS — N189 Chronic kidney disease, unspecified: Secondary | ICD-10-CM | POA: Insufficient documentation

## 2022-10-24 DIAGNOSIS — Z8041 Family history of malignant neoplasm of ovary: Secondary | ICD-10-CM

## 2022-10-24 DIAGNOSIS — E669 Obesity, unspecified: Secondary | ICD-10-CM | POA: Diagnosis not present

## 2022-10-24 DIAGNOSIS — Z79899 Other long term (current) drug therapy: Secondary | ICD-10-CM | POA: Diagnosis not present

## 2022-10-24 DIAGNOSIS — R0602 Shortness of breath: Secondary | ICD-10-CM

## 2022-10-24 DIAGNOSIS — I129 Hypertensive chronic kidney disease with stage 1 through stage 4 chronic kidney disease, or unspecified chronic kidney disease: Secondary | ICD-10-CM | POA: Diagnosis not present

## 2022-10-24 DIAGNOSIS — Z818 Family history of other mental and behavioral disorders: Secondary | ICD-10-CM | POA: Diagnosis not present

## 2022-10-24 DIAGNOSIS — Z800 Family history of malignant neoplasm of digestive organs: Secondary | ICD-10-CM | POA: Diagnosis not present

## 2022-10-24 DIAGNOSIS — D509 Iron deficiency anemia, unspecified: Secondary | ICD-10-CM

## 2022-10-24 DIAGNOSIS — R5383 Other fatigue: Secondary | ICD-10-CM | POA: Diagnosis not present

## 2022-10-24 DIAGNOSIS — D649 Anemia, unspecified: Secondary | ICD-10-CM

## 2022-10-24 DIAGNOSIS — Z841 Family history of disorders of kidney and ureter: Secondary | ICD-10-CM

## 2022-10-24 LAB — CBC WITH DIFFERENTIAL (CANCER CENTER ONLY)
Abs Immature Granulocytes: 0.05 10*3/uL (ref 0.00–0.07)
Basophils Absolute: 0 10*3/uL (ref 0.0–0.1)
Basophils Relative: 1 %
Eosinophils Absolute: 0.2 10*3/uL (ref 0.0–0.5)
Eosinophils Relative: 3 %
HCT: 34.6 % — ABNORMAL LOW (ref 36.0–46.0)
Hemoglobin: 10.9 g/dL — ABNORMAL LOW (ref 12.0–15.0)
Immature Granulocytes: 1 %
Lymphocytes Relative: 37 %
Lymphs Abs: 2.5 10*3/uL (ref 0.7–4.0)
MCH: 29.5 pg (ref 26.0–34.0)
MCHC: 31.5 g/dL (ref 30.0–36.0)
MCV: 93.8 fL (ref 80.0–100.0)
Monocytes Absolute: 0.4 10*3/uL (ref 0.1–1.0)
Monocytes Relative: 5 %
Neutro Abs: 3.6 10*3/uL (ref 1.7–7.7)
Neutrophils Relative %: 53 %
Platelet Count: 239 10*3/uL (ref 150–400)
RBC: 3.69 MIL/uL — ABNORMAL LOW (ref 3.87–5.11)
RDW: 12.5 % (ref 11.5–15.5)
WBC Count: 6.7 10*3/uL (ref 4.0–10.5)
nRBC: 0 % (ref 0.0–0.2)

## 2022-10-24 LAB — CMP (CANCER CENTER ONLY)
ALT: 10 U/L (ref 0–44)
AST: 15 U/L (ref 15–41)
Albumin: 4 g/dL (ref 3.5–5.0)
Alkaline Phosphatase: 95 U/L (ref 38–126)
Anion gap: 8 (ref 5–15)
BUN: 13 mg/dL (ref 6–20)
CO2: 31 mmol/L (ref 22–32)
Calcium: 9.3 mg/dL (ref 8.9–10.3)
Chloride: 104 mmol/L (ref 98–111)
Creatinine: 1.09 mg/dL — ABNORMAL HIGH (ref 0.44–1.00)
GFR, Estimated: 60 mL/min — ABNORMAL LOW (ref >60)
Glucose, Bld: 90 mg/dL (ref 70–99)
Potassium: 3.6 mmol/L (ref 3.5–5.1)
Sodium: 143 mmol/L (ref 135–145)
Total Bilirubin: 0.3 mg/dL (ref 0.3–1.2)
Total Protein: 7.1 g/dL (ref 6.5–8.1)

## 2022-10-24 LAB — IRON AND IRON BINDING CAPACITY (CC-WL,HP ONLY)
Iron: 53 ug/dL (ref 28–170)
Saturation Ratios: 19 % (ref 10.4–31.8)
TIBC: 284 ug/dL (ref 250–450)
UIBC: 231 ug/dL (ref 148–442)

## 2022-10-24 LAB — FERRITIN: Ferritin: 312 ng/mL — ABNORMAL HIGH (ref 11–307)

## 2022-10-24 LAB — RETICULOCYTES
Immature Retic Fract: 3.6 % (ref 2.3–15.9)
RBC.: 3.62 MIL/uL — ABNORMAL LOW (ref 3.87–5.11)
Retic Count, Absolute: 43.4 10*3/uL (ref 19.0–186.0)
Retic Ct Pct: 1.2 % (ref 0.4–3.1)

## 2022-10-24 LAB — LACTATE DEHYDROGENASE: LDH: 148 U/L (ref 98–192)
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
## 2022-10-26 ENCOUNTER — Encounter (INDEPENDENT_AMBULATORY_CARE_PROVIDER_SITE_OTHER): Payer: Self-pay | Admitting: Family Medicine

## 2022-10-26 ENCOUNTER — Ambulatory Visit (INDEPENDENT_AMBULATORY_CARE_PROVIDER_SITE_OTHER): Payer: 59 | Admitting: Family Medicine

## 2022-10-26 VITALS — BP 106/73 | HR 85 | Temp 98.3°F | Ht 64.0 in | Wt 242.0 lb

## 2022-10-26 DIAGNOSIS — E88819 Insulin resistance, unspecified: Secondary | ICD-10-CM | POA: Diagnosis not present

## 2022-10-26 DIAGNOSIS — E669 Obesity, unspecified: Secondary | ICD-10-CM

## 2022-10-26 DIAGNOSIS — I10 Essential (primary) hypertension: Secondary | ICD-10-CM | POA: Diagnosis not present

## 2022-10-26 DIAGNOSIS — Z6841 Body Mass Index (BMI) 40.0 and over, adult: Secondary | ICD-10-CM

## 2022-10-26 LAB — ERYTHROPOIETIN: Erythropoietin: 9.2 m[IU]/mL (ref 2.6–18.5)

## 2022-10-26 MED ORDER — TIRZEPATIDE 5 MG/0.5ML ~~LOC~~ SOAJ: 5.0000 mg | SUBCUTANEOUS | 0 refills | Status: DC

## 2022-10-26 NOTE — Progress Notes (Signed)
Chief Complaint:   OBESITY Joy [name] is here to discuss her progress with her obesity treatment plan along with follow-up of her obesity related diagnoses. Joy [name] is on the Category 3 Plan and states she is following her eating plan approximately 100% of the time. Joy [name] states she is [employer] 45 minutes 5 times per week.  Today's visit was #: 27 Starting weight: 71 LBS Starting date: 12/16/2019 Today's weight: 242 LBS Today's date: 10/26/2022 Total lbs lost to date: 26 LBS Total lbs lost since last in-office visit: +3 LBS  Interim History:  Since last appointment she is getting her stuff together to move.  Move is scheduled for March 29th.  She is feeling pretty good about the move in general.  She is feeling pretty hungry.  Has not increased to Category 3 from Category 2.  She voices that she does not anticipate any issues with following Category 3 during her move.   Subjective:   1. Insulin resistance Patient last A1c within normal limits, insulin 6.9.  Patient is taking Mounjaro with no GI side effects.  2. Essential hypertension Blood pressure controlled today.  Patient denies chest pain, chest pressure, headache.  Patient is taking lisinopril.  Assessment/Plan:   1. Insulin resistance Refill- tirzepatide (MOUNJARO) 5 MG/0.5ML Pen; Inject 5 mg into the skin once a week.  Dispense: 2 mL; Refill: 0  2. Essential hypertension Continue current medications.  No change in medications.  3. BMI 40.0-44.9, adult ([place])  4. Obesity with starting BMI of 46.0 Joy [name] is currently in the action stage of change. As such, her goal is to continue with weight loss efforts. She has agreed to the Category 3 Plan.   Exercise goals:  As is.  Behavioral modification strategies: increasing lean protein intake, meal planning and cooking strategies, keeping healthy foods in the home, and planning for success.  Joy [name] has agreed to follow-up with our clinic in 4-5 weeks. She was  informed of the importance of frequent follow-up visits to maximize her success with intensive lifestyle modifications for her multiple health conditions.   Objective:   Blood pressure 106/73, pulse 85, temperature 98.3 F (36.8 C), height 5\' 4"  (1.626 m), weight 242 lb (109.8 kg), SpO2 100 %. Body mass index is 41.54 kg/m.  General: Cooperative, alert, well developed, in no acute distress. HEENT: Conjunctivae and lids unremarkable. Cardiovascular: Regular rhythm.  Lungs: Normal work of breathing. Neurologic: No focal deficits.   Lab Results  Component Value Date   CREATININE 1.09 (H) 10/24/2022   BUN 13 10/24/2022   NA 143 10/24/2022   K 3.6 10/24/2022   CL 104 10/24/2022   CO2 31 10/24/2022   Lab Results  Component Value Date   ALT 10 10/24/2022   AST 15 10/24/2022   ALKPHOS 95 10/24/2022   BILITOT 0.3 10/24/2022   Lab Results  Component Value Date   HGBA1C 5.3 09/07/2022   HGBA1C 5.1 01/11/2022   HGBA1C 5.3 09/20/2021   HGBA1C 5.4 05/24/2021   HGBA1C 5.3 02/07/2021   Lab Results  Component Value Date   INSULIN 6.9 09/07/2022   INSULIN 14.1 01/11/2022   INSULIN 12.6 05/24/2021   INSULIN 7.1 02/07/2021   INSULIN 10.9 09/28/2020   Lab Results  Component Value Date   TSH 2.100 12/16/2019   Lab Results  Component Value Date   CHOL 184 09/07/2022   HDL 43 09/07/2022   LDLCALC 126 (H) 09/07/2022   TRIG 79 09/07/2022   CHOLHDL 3.7 02/07/2021  Lab Results  Component Value Date   VD25OH 50.6 09/07/2022   VD25OH 50.4 01/11/2022   VD25OH 58.3 05/24/2021   Lab Results  Component Value Date   WBC 6.7 10/24/2022   HGB 10.9 (L) 10/24/2022   HCT 34.6 (L) 10/24/2022   MCV 93.8 10/24/2022   PLT 239 10/24/2022   Lab Results  Component Value Date   IRON 53 10/24/2022   TIBC 284 10/24/2022   FERRITIN 312 (H) 10/24/2022   Attestation Statements:   Reviewed by clinician on day of visit: allergies, medications, problem list, medical history, surgical  history, family history, social history, and previous encounter notes.  I, Davy Pique, RMA, am acting as transcriptionist for Coralie Common, MD.  I have reviewed the above documentation for accuracy and completeness, and I agree with the above. - Coralie Common, MD

## 2022-10-30 ENCOUNTER — Encounter (INDEPENDENT_AMBULATORY_CARE_PROVIDER_SITE_OTHER): Payer: Self-pay | Admitting: Family Medicine

## 2022-11-01 ENCOUNTER — Ambulatory Visit (HOSPITAL_COMMUNITY): Admission: RE | Admit: 2022-11-01 | Payer: 59 | Source: Ambulatory Visit

## 2022-11-02 ENCOUNTER — Ambulatory Visit (HOSPITAL_COMMUNITY)
Admission: RE | Admit: 2022-11-02 | Discharge: 2022-11-02 | Disposition: A | Discharge status: Home / Self Care | Payer: 59 | Source: Ambulatory Visit | Attending: Gastroenterology | Admitting: Gastroenterology

## 2022-11-02 DIAGNOSIS — R857 Abnormal histological findings in specimens from digestive organs and abdominal cavity: Secondary | ICD-10-CM | POA: Diagnosis not present

## 2022-11-02 DIAGNOSIS — R109 Unspecified abdominal pain: Secondary | ICD-10-CM | POA: Diagnosis not present

## 2022-11-02 DIAGNOSIS — R1013 Epigastric pain: Secondary | ICD-10-CM

## 2022-11-02 MED ORDER — IOHEXOL 300 MG/ML  SOLN
100.0000 mL | Freq: Once | INTRAMUSCULAR | Status: AC | PRN
  Administered 2022-11-02: 100 mL via INTRAVENOUS

## 2022-11-02 MED ORDER — SODIUM CHLORIDE (PF) 0.9 % IJ SOLN
INTRAMUSCULAR | Status: AC
  Filled 2022-11-02: qty 50

## 2022-11-09 ENCOUNTER — Ambulatory Visit (INDEPENDENT_AMBULATORY_CARE_PROVIDER_SITE_OTHER): Payer: 59

## 2022-11-09 DIAGNOSIS — E538 Deficiency of other specified B group vitamins: Secondary | ICD-10-CM

## 2022-11-09 MED ORDER — CYANOCOBALAMIN 1000 MCG/ML IJ SOLN
1000.0000 ug | Freq: Once | INTRAMUSCULAR | Status: AC
  Administered 2022-11-09: 1000 ug via INTRAMUSCULAR

## 2022-11-09 NOTE — Progress Notes (Signed)
Pt here for monthly B12 injection per Melissa O'sullivan  B12 1084mcg given IM left deltoid, and pt tolerated injection well.  Next B12 injection scheduled for 12/12/2022.

## 2022-11-09 NOTE — Addendum Note (Signed)
Addended by: Sena Hitch on: 11/09/2022 10:23 AM   Modules accepted: Orders

## 2022-11-27 ENCOUNTER — Telehealth (INDEPENDENT_AMBULATORY_CARE_PROVIDER_SITE_OTHER): Payer: 59 | Admitting: Family Medicine

## 2022-11-27 ENCOUNTER — Encounter (INDEPENDENT_AMBULATORY_CARE_PROVIDER_SITE_OTHER): Payer: Self-pay | Admitting: Family Medicine

## 2022-11-27 DIAGNOSIS — Z6841 Body Mass Index (BMI) 40.0 and over, adult: Secondary | ICD-10-CM

## 2022-11-27 DIAGNOSIS — E88819 Insulin resistance, unspecified: Secondary | ICD-10-CM

## 2022-11-27 DIAGNOSIS — E669 Obesity, unspecified: Secondary | ICD-10-CM | POA: Diagnosis not present

## 2022-11-27 DIAGNOSIS — D508 Other iron deficiency anemias: Secondary | ICD-10-CM

## 2022-11-27 MED ORDER — TIRZEPATIDE 5 MG/0.5ML ~~LOC~~ SOAJ: 5.0000 mg | SUBCUTANEOUS | 0 refills | Status: DC

## 2022-11-27 NOTE — Progress Notes (Signed)
TeleHealth Visit:  Due to the COVID-19 pandemic, this visit was completed with telemedicine (audio/video) technology to reduce patient and provider exposure as well as to preserve personal protective equipment.   Joy [name] has verbally consented to this TeleHealth visit. The patient is located at home, the provider is located at the [facility] office. The participants in this visit include the listed provider and patient. The visit was conducted today via video visit.  Chief Complaint: OBESITY Joy [name] is here to discuss her progress with her obesity treatment plan along with follow-up of her obesity related diagnoses. Joy [name] is on the Category 3 Plan and states she is following her eating plan approximately 100% of the time. Joy [name] states she is stationary bike, weights.   Today's visit was #: 44 Starting weight: 268 lbs Starting date: 12/16/2019  Interim History:  Patient is in [place] today- she already moved (earlier than expected).  Weighed at home and reports weight of 238.  She has a much more fully stocked gym at her apartment complex, a pool and there is even aqua aerobics at the complex pool.  Has been following category 2 and is still feeling a bit more hunger.  She is trying to eat fruit, veggies or protein bars when she is hunger. She is doing 10oz of meat, vegetables and water at supper. Thinks following meal plan will be relatively easy for the next few weeks.    Subjective:   1. Insulin resistance Patient is on Mounjaro with no side effects.  She has been experiencing satiety with medication.   2. Other iron deficiency anemia Patient saw Oncology and had a recent anemia panel that has improved.   Assessment/Plan:   1. Insulin resistance Refill - tirzepatide (MOUNJARO) 5 MG/0.5ML Pen; Inject 5 mg into the skin once a week.  Dispense: 2 mL; Refill: 0  2. Other iron deficiency anemia Patient to establish care with oncology in [place].   3.  BMI 40.0-44.9, adult  4. Obesity with starting BMI of 46.0 Joy [name] is currently in the action stage of change. As such, her goal is to continue with weight loss efforts. She has agreed to the Category 2 Plan.   Exercise goals: All adults should avoid inactivity. Some physical activity is better than none, and adults who participate in any amount of physical activity gain some health benefits.  Behavioral modification strategies: increasing lean protein intake, meal planning and cooking strategies, keeping healthy foods in the home, and planning for success.  Joy [name] has agreed to follow-up with our clinic in 4 weeks. She was informed of the importance of frequent follow-up visits to maximize her success with intensive lifestyle modifications for her multiple health conditions.  Objective:   VITALS: Per patient if applicable, see vitals. GENERAL: Alert and in no acute distress. CARDIOPULMONARY: No increased WOB. Speaking in clear sentences.  PSYCH: Pleasant and cooperative. Speech normal rate and rhythm. Affect is appropriate. Insight and judgement are appropriate. Attention is focused, linear, and appropriate.  NEURO: Oriented as arrived to appointment on time with no prompting.   Lab Results  Component Value Date   CREATININE 1.09 (H) 10/24/2022   BUN 13 10/24/2022   NA 143 10/24/2022   K 3.6 10/24/2022   CL 104 10/24/2022   CO2 31 10/24/2022   Lab Results  Component Value Date   ALT 10 10/24/2022   AST 15 10/24/2022   ALKPHOS 95 10/24/2022   BILITOT 0.3 10/24/2022   Lab Results  Component Value  Date   HGBA1C 5.3 09/07/2022   HGBA1C 5.1 01/11/2022   HGBA1C 5.3 09/20/2021   HGBA1C 5.4 05/24/2021   HGBA1C 5.3 02/07/2021   Lab Results  Component Value Date   INSULIN 6.9 09/07/2022   INSULIN 14.1 01/11/2022   INSULIN 12.6 05/24/2021   INSULIN 7.1 02/07/2021   INSULIN 10.9 09/28/2020   Lab Results  Component Value Date   TSH 2.100 12/16/2019   Lab Results   Component Value Date   CHOL 184 09/07/2022   HDL 43 09/07/2022   LDLCALC 126 (H) 09/07/2022   TRIG 79 09/07/2022   CHOLHDL 3.7 02/07/2021   Lab Results  Component Value Date   VD25OH 50.6 09/07/2022   VD25OH 50.4 01/11/2022   VD25OH 58.3 05/24/2021   Lab Results  Component Value Date   WBC 6.7 10/24/2022   HGB 10.9 (L) 10/24/2022   HCT 34.6 (L) 10/24/2022   MCV 93.8 10/24/2022   PLT 239 10/24/2022   Lab Results  Component Value Date   IRON 53 10/24/2022   TIBC 284 10/24/2022   FERRITIN 312 (H) 10/24/2022    Attestation Statements:   Reviewed by clinician on day of visit: allergies, medications, problem list, medical history, surgical history, family history, social history, and previous encounter notes.  I, Malcolm Metro, RMA, am acting as transcriptionist for Reuben Likes, MD.  I have reviewed the above documentation for accuracy and completeness, and I agree with the above. - Reuben Likes, MD

## 2022-11-28 ENCOUNTER — Ambulatory Visit: Payer: 59 | Admitting: Nurse Practitioner

## 2022-11-30 ENCOUNTER — Other Ambulatory Visit: Payer: Self-pay | Admitting: Family

## 2022-11-30 DIAGNOSIS — I10 Essential (primary) hypertension: Secondary | ICD-10-CM

## 2022-12-05 NOTE — Telephone Encounter (Signed)
Please advise,   request [facility] [address] for [facility] 2.5mg   2.5mg  started 09/07/22, started  09/28/22

## 2022-12-06 ENCOUNTER — Telehealth (INDEPENDENT_AMBULATORY_CARE_PROVIDER_SITE_OTHER): Payer: Self-pay | Admitting: Family Medicine

## 2022-12-06 NOTE — Telephone Encounter (Signed)
Pt called 12/06/22 the [facility] out of stock of the [place].The [facility] on [address] has the 2.5mg  wants to know if she can take two of .  twice a week or what to take?

## 2022-12-06 NOTE — Telephone Encounter (Signed)
Please advise 

## 2022-12-07 ENCOUNTER — Encounter (INDEPENDENT_AMBULATORY_CARE_PROVIDER_SITE_OTHER): Payer: Self-pay | Admitting: Adult Health

## 2022-12-11 ENCOUNTER — Other Ambulatory Visit (INDEPENDENT_AMBULATORY_CARE_PROVIDER_SITE_OTHER): Payer: Self-pay | Admitting: Family Medicine

## 2022-12-11 DIAGNOSIS — E88819 Insulin resistance, unspecified: Secondary | ICD-10-CM

## 2022-12-11 MED ORDER — TIRZEPATIDE 2.5 MG/0.5ML ~~LOC~~ SOAJ: 2.5000 mg | SUBCUTANEOUS | 0 refills | Status: DC

## 2022-12-12 ENCOUNTER — Ambulatory Visit: Payer: 59

## 2022-12-18 ENCOUNTER — Other Ambulatory Visit: Payer: Self-pay

## 2022-12-18 MED ORDER — ALBUTEROL SULFATE HFA 108 (90 BASE) MCG/ACT IN AERS: 2.0000 | INHALATION_SPRAY | RESPIRATORY_TRACT | 0 refills | Status: AC | PRN

## 2022-12-25 ENCOUNTER — Inpatient Hospital Stay: Payer: 59 | Admitting: Family

## 2022-12-25 ENCOUNTER — Inpatient Hospital Stay: Payer: 59

## 2022-12-26 ENCOUNTER — Ambulatory Visit (INDEPENDENT_AMBULATORY_CARE_PROVIDER_SITE_OTHER): Payer: 59 | Admitting: Family Medicine

## 2022-12-26 ENCOUNTER — Other Ambulatory Visit: Payer: Self-pay | Admitting: *Deleted

## 2022-12-26 ENCOUNTER — Other Ambulatory Visit: Payer: Self-pay

## 2022-12-26 ENCOUNTER — Encounter (INDEPENDENT_AMBULATORY_CARE_PROVIDER_SITE_OTHER): Payer: Self-pay | Admitting: Family Medicine

## 2022-12-26 ENCOUNTER — Encounter: Payer: Self-pay | Admitting: Family

## 2022-12-26 ENCOUNTER — Inpatient Hospital Stay (HOSPITAL_BASED_OUTPATIENT_CLINIC_OR_DEPARTMENT_OTHER): Payer: 59 | Admitting: Family

## 2022-12-26 ENCOUNTER — Inpatient Hospital Stay: Payer: 59 | Attending: Hematology & Oncology

## 2022-12-26 VITALS — BP 128/77 | HR 91 | Temp 98.2°F | Resp 20 | Ht 64.0 in | Wt 249.0 lb

## 2022-12-26 VITALS — BP 105/72 | HR 74 | Temp 97.9°F | Ht 64.0 in | Wt 245.0 lb

## 2022-12-26 DIAGNOSIS — E88819 Insulin resistance, unspecified: Secondary | ICD-10-CM | POA: Diagnosis not present

## 2022-12-26 DIAGNOSIS — Z79899 Other long term (current) drug therapy: Secondary | ICD-10-CM | POA: Diagnosis not present

## 2022-12-26 DIAGNOSIS — I10 Essential (primary) hypertension: Secondary | ICD-10-CM | POA: Diagnosis not present

## 2022-12-26 DIAGNOSIS — D649 Anemia, unspecified: Secondary | ICD-10-CM

## 2022-12-26 DIAGNOSIS — M0609 Rheumatoid arthritis without rheumatoid factor, multiple sites: Secondary | ICD-10-CM | POA: Diagnosis not present

## 2022-12-26 DIAGNOSIS — Z6841 Body Mass Index (BMI) 40.0 and over, adult: Secondary | ICD-10-CM | POA: Diagnosis not present

## 2022-12-26 DIAGNOSIS — M159 Polyosteoarthritis, unspecified: Secondary | ICD-10-CM | POA: Diagnosis not present

## 2022-12-26 DIAGNOSIS — D509 Iron deficiency anemia, unspecified: Secondary | ICD-10-CM | POA: Diagnosis not present

## 2022-12-26 LAB — CBC WITH DIFFERENTIAL (CANCER CENTER ONLY)
Abs Immature Granulocytes: 0.01 10*3/uL (ref 0.00–0.07)
Basophils Absolute: 0 10*3/uL (ref 0.0–0.1)
Basophils Relative: 0 %
Eosinophils Absolute: 0.2 10*3/uL (ref 0.0–0.5)
Eosinophils Relative: 2 %
HCT: 35.9 % — ABNORMAL LOW (ref 36.0–46.0)
Hemoglobin: 11.3 g/dL — ABNORMAL LOW (ref 12.0–15.0)
Immature Granulocytes: 0 %
Lymphocytes Relative: 32 %
Lymphs Abs: 2.3 10*3/uL (ref 0.7–4.0)
MCH: 29.6 pg (ref 26.0–34.0)
MCHC: 31.5 g/dL (ref 30.0–36.0)
MCV: 94 fL (ref 80.0–100.0)
Monocytes Absolute: 0.3 10*3/uL (ref 0.1–1.0)
Monocytes Relative: 4 %
Neutro Abs: 4.4 10*3/uL (ref 1.7–7.7)
Neutrophils Relative %: 62 %
Platelet Count: 221 10*3/uL (ref 150–400)
RBC: 3.82 MIL/uL — ABNORMAL LOW (ref 3.87–5.11)
RDW: 12.6 % (ref 11.5–15.5)
WBC Count: 7.2 10*3/uL (ref 4.0–10.5)
nRBC: 0 % (ref 0.0–0.2)

## 2022-12-26 LAB — RETICULOCYTES
Immature Retic Fract: 4.9 % (ref 2.3–15.9)
RBC.: 3.76 MIL/uL — ABNORMAL LOW (ref 3.87–5.11)
Retic Count, Absolute: 44.7 10*3/uL (ref 19.0–186.0)
Retic Ct Pct: 1.2 % (ref 0.4–3.1)

## 2022-12-26 LAB — FERRITIN: Ferritin: 301 ng/mL (ref 11–307)

## 2022-12-26 MED ORDER — BETAMETHASONE DIPROPIONATE 0.05 % EX CREA: TOPICAL_CREAM | Freq: Two times a day (BID) | CUTANEOUS | 0 refills | Status: AC

## 2022-12-26 MED ORDER — OZEMPIC (0.25 OR 0.5 MG/DOSE) 2 MG/3ML ~~LOC~~ SOPN: 0.2500 mg | PEN_INJECTOR | SUBCUTANEOUS | 0 refills | Status: DC

## 2022-12-26 NOTE — Progress Notes (Unsigned)
Chief Complaint:   OBESITY Joy [name] is here to discuss her progress with her obesity treatment plan along with follow-up of her obesity related diagnoses. Joy [name] is on the Category 3 Plan and states she is following her eating plan approximately 100% of the time. Joy [name] states she is on the treadmill and lifting weights for 30-60 minutes 5 times per week.  Today's visit was #: 45 Starting weight: 268 lbs Starting date: 12/16/2019 Today's weight: 245 lbs Today's date: 12/26/2022 Total lbs lost to date: 23 Total lbs lost since last in-office visit: 0  Interim History: Patient feels very frustrated with inability to get Mounjaro due to national drug shortage.  She mentions that her insurance will cover Ozempic.  She feels the medication is going out of her system because she feels much more hunger. She is doing 8oz of meat for supper meal and is being consistent with her resistance training.  Going to a wedding this weekend.  Not sure if she has any plans for the summer.   Subjective:   1. Insulin resistance Joy [name] is is on Mounjaro with good control of food choices and nutrition.  Her last A1c was better controlled.  2. Essential hypertension Joy [name]'s blood pressure is well-controlled today.  She denies chest pain, chest pressure, or headache.  She is on lisinopril.  Assessment/Plan:   1. Insulin resistance Joy [name] agreed to start [facility]; and start Ozempic at 0.25 mg SubQ weekly with no refills.  She will increase to 0.5 mg after 4 weeks.  - Semaglutide,0.25 or 0.5MG /DOS, (OZEMPIC, 0.25 OR 0.5 MG/DOSE,) 2 MG/3ML SOPN; Inject 0.25 mg into the skin once a week. (Patient not taking: Reported on 12/26/2022)  Dispense: 3 mL; Refill: 0  2. Essential hypertension Joy [name] will continue her current medications and dose.  3. Morbid obesity (HCC)  4. Obesity, current BMI 42.1 Joy [name] is currently in the action stage of change. As such, her goal is to continue with  weight loss efforts. She has agreed to the Category 3 Plan.   We will refer Joy [name] to [facility] on [address] in [place].  Exercise goals: As is.  Behavioral modification strategies: increasing lean protein intake, meal planning and cooking strategies, keeping healthy foods in the home, and planning for success.  Joy [name] has agreed to follow-up with our clinic in 6 weeks. She was informed of the importance of frequent follow-up visits to maximize her success with intensive lifestyle modifications for her multiple health conditions.   Objective:   Blood pressure 105/72, pulse 74, temperature 97.9 F (36.6 C), height 5\' 4"  (1.626 m), weight 245 lb (111.1 kg), SpO2 98 %. Body mass index is 42.05 kg/m.  General: Cooperative, alert, well developed, in no acute distress. HEENT: Conjunctivae and lids unremarkable. Cardiovascular: Regular rhythm.  Lungs: Normal work of breathing. Neurologic: No focal deficits.   Lab Results  Component Value Date   CREATININE 1.09 (H) 10/24/2022   BUN 13 10/24/2022   NA 143 10/24/2022   K 3.6 10/24/2022   CL 104 10/24/2022   CO2 31 10/24/2022   Lab Results  Component Value Date   ALT 10 10/24/2022   AST 15 10/24/2022   ALKPHOS 95 10/24/2022   BILITOT 0.3 10/24/2022   Lab Results  Component Value Date   HGBA1C 5.3 09/07/2022   HGBA1C 5.1 01/11/2022   HGBA1C 5.3 09/20/2021   HGBA1C 5.4 05/24/2021   HGBA1C 5.3 02/07/2021   Lab Results  Component Value Date   INSULIN 6.9  09/07/2022   INSULIN 14.1 01/11/2022   INSULIN 12.6 05/24/2021   INSULIN 7.1 02/07/2021   INSULIN 10.9 09/28/2020   Lab Results  Component Value Date   TSH 2.100 12/16/2019   Lab Results  Component Value Date   CHOL 184 09/07/2022   HDL 43 09/07/2022   LDLCALC 126 (H) 09/07/2022   TRIG 79 09/07/2022   CHOLHDL 3.7 02/07/2021   Lab Results  Component Value Date   VD25OH 50.6 09/07/2022   VD25OH 50.4 01/11/2022   VD25OH 58.3 05/24/2021    Lab Results  Component Value Date   WBC 7.2 12/26/2022   HGB 11.3 (L) 12/26/2022   HCT 35.9 (L) 12/26/2022   MCV 94.0 12/26/2022   PLT 221 12/26/2022   Lab Results  Component Value Date   IRON 53 10/24/2022   TIBC 284 10/24/2022   FERRITIN 301 12/26/2022   Attestation Statements:   Reviewed by clinician on day of visit: allergies, medications, problem list, medical history, surgical history, family history, social history, and previous encounter notes.   I, Burt Knack, am acting as transcriptionist for Reuben Likes, MD.  I have reviewed the above documentation for accuracy and completeness, and I agree with the above. - Reuben Likes, MD

## 2022-12-26 NOTE — Progress Notes (Signed)
Hematology and Oncology Follow Up Visit  Joy [name] [id number] [birth year] 57 y.o. 12/26/2022   Principle Diagnosis:  Iron deficiency anemia   Current Therapy:   OTC Oral iron PO every other day   Interim History:  Joy [name] is here today for follow-up. She is doing well and has no complaints at this time.  She has moved to [place] and is getting established with a new PCP and Hematology there.  No issue with blood loss. No bruising or petechiae.  She is taking her oral iron supplement every other day.  No fever, chills, n/v, cough, rash, dizziness, SOB, chest pain, palpitations, abdominal pain or changes in bowel or bladder habits.  No swelling in her extremities. She has generalized joint aches and pains that wax and wanes.  Numbness and tingling present at times in the left great toe.  No falls or syncope reported.  Appetite and hydration are good. Weight is stable at 249 lbs.   ECOG Performance Status: 1 - Symptomatic but completely ambulatory  Medications:  Allergies as of 12/26/2022   No Known Allergies      Medication List        Accurate as of Dec 26, 2022  1:38 PM. If you have any questions, ask your nurse or doctor.          STOP taking these medications    tirzepatide 2.5 MG/0.5ML Pen Commonly known as: MOUNJARO Stopped by: Rudean Hitt, MD       TAKE these medications    acetaminophen 500 MG tablet Commonly known as: TYLENOL Take 1,000 mg by mouth daily as needed for headache.   albuterol 108 (90 Base) MCG/ACT inhaler Commonly known as: VENTOLIN HFA Inhale 2 puffs into the lungs as needed for wheezing or shortness of breath.   atorvastatin 20 MG tablet Commonly known as: LIPITOR TAKE 1 TABLET(20 MG) BY MOUTH DAILY   betamethasone dipropionate 0.05 % cream Apply topically 2 (two) times daily.   cetirizine 10 MG tablet Commonly known as: ZyrTEC Allergy Take 1 tablet (10 mg total) by mouth daily.   dicyclomine 10 MG  capsule Commonly known as: BENTYL Take 1 capsule (10 mg total) by mouth 4 (four) times daily -  before meals and at bedtime.   ferrous sulfate 325 (65 FE) MG tablet Take 1 tablet (325 mg total) by mouth every other day.   Flovent Diskus 50 MCG/ACT Aepb Generic drug: Fluticasone Propionate (Inhal) Inhale 2 puffs into the lungs 2 (two) times daily. 50 mcg   fluticasone 50 MCG/ACT nasal spray Commonly known as: FLONASE Place 2 sprays into both nostrils daily.   hydrocortisone 2.5 % cream Apply 1 Application topically as needed.   hydroxychloroquine 200 MG tablet Commonly known as: PLAQUENIL Take 200 mg by mouth daily.   leflunomide 20 MG tablet Commonly known as: ARAVA Take 20 mg by mouth daily.   lisinopril 5 MG tablet Commonly known as: ZESTRIL Take 1 tablet (5 mg total) by mouth daily.   Olopatadine HCl 0.7 % Soln Apply 1 drop to eye as needed.   omeprazole 40 MG capsule Commonly known as: PRILOSEC Take 1 capsule (40 mg total) by mouth in the morning and at bedtime.   Ozempic (0.25 or 0.5 MG/DOSE) 2 MG/3ML Sopn Generic drug: Semaglutide(0.25 or 0.5MG /DOS) Inject 0.25 mg into the skin once a week. Started by: Rudean Hitt, MD   REFRESH OP Place 2 drops into both eyes daily.   traMADol 50 MG tablet Commonly known  as: ULTRAM Take 50 mg by mouth daily as needed for moderate pain.   Vitamin D3 125 MCG (5000 UT) Caps Take 1 capsule (5,000 Units total) by mouth daily.   WOMENS MULTIVITAMIN PO Take 1 tablet by mouth daily.        Allergies: No Known Allergies  Past Medical History, Surgical history, Social history, and Family History were reviewed and updated.  Review of Systems: All other 10 point review of systems is negative.   Physical Exam:  vitals were not taken for this visit.   Wt Readings from Last 3 Encounters:  12/26/22 245 lb (111.1 kg)  10/26/22 242 lb (109.8 kg)  10/24/22 249 lb (112.9 kg)    Ocular: Sclerae unicteric, pupils equal,  round and reactive to light Ear-nose-throat: Oropharynx clear, dentition fair Lymphatic: No cervical or supraclavicular adenopathy Lungs no rales or rhonchi, good excursion bilaterally Heart regular rate and rhythm, no murmur appreciated Abd soft, nontender, positive bowel sounds MSK no focal spinal tenderness, no joint edema Neuro: non-focal, well-oriented, appropriate affect Breasts: Deferred   Lab Results  Component Value Date   WBC 7.2 12/26/2022   HGB 11.3 (L) 12/26/2022   HCT 35.9 (L) 12/26/2022   MCV 94.0 12/26/2022   PLT 221 12/26/2022   Lab Results  Component Value Date   FERRITIN 312 (H) 10/24/2022   IRON 53 10/24/2022   TIBC 284 10/24/2022   UIBC 231 10/24/2022   IRONPCTSAT 19 10/24/2022   Lab Results  Component Value Date   RETICCTPCT 1.2 12/26/2022   RBC 3.76 (L) 12/26/2022   No results found for: "KPAFRELGTCHN", "LAMBDASER", "KAPLAMBRATIO" No results found for: "IGGSERUM", "IGA", "IGMSERUM" No results found for: "TOTALPROTELP", "ALBUMINELP", "A1GS", "A2GS", "BETS", "BETA2SER", "GAMS", "MSPIKE", "SPEI"   Chemistry      Component Value Date/Time   NA 143 10/24/2022 0825   NA 144 09/07/2022 1422   K 3.6 10/24/2022 0825   CL 104 10/24/2022 0825   CO2 31 10/24/2022 0825   BUN 13 10/24/2022 0825   BUN 7 09/07/2022 1422   CREATININE 1.09 (H) 10/24/2022 0825   CREATININE 0.88 06/30/2022 1536      Component Value Date/Time   CALCIUM 9.3 10/24/2022 0825   ALKPHOS 95 10/24/2022 0825   AST 15 10/24/2022 0825   ALT 10 10/24/2022 0825   BILITOT 0.3 10/24/2022 0825       Impression and Plan: Joy [name] 57 yo female with long history of iron deficiency anemia.  Iron studies are pending.  She has moved to [place] and is establishing with hematology there. She states that if she needs to follow-up with [name] she will call and request an appointment.   Eileen Stanford, NP 5/14/20241:38 PM

## 2022-12-26 NOTE — Telephone Encounter (Signed)
[facility] [address] in [place] requesting a refill for   Betamethasone 0.05% cream  last filled 12/14/22

## 2022-12-27 LAB — IRON AND IRON BINDING CAPACITY (CC-WL,HP ONLY)
Iron: 55 ug/dL (ref 28–170)
Saturation Ratios: 20 % (ref 10.4–31.8)
TIBC: 270 ug/dL (ref 250–450)
UIBC: 215 ug/dL (ref 148–442)

## 2023-01-03 ENCOUNTER — Telehealth: Payer: Self-pay

## 2023-01-03 NOTE — Telephone Encounter (Signed)
-----   Message from Erenest Blank, NP sent at 01/03/2023 11:20 AM EDT ----- Regarding: RE: Referral Maurice Small can you call her and let her know that her new PCP can manage her IDA if she is ok with that. The oral supplement she is on is working fine! They can refer her to a hematologist if she ever required IV iron which hopefully won't be the case since she has done well on the supplement. Thank you!  Sarah   ----- Message ----- From: Doneta Public Sent: 01/02/2023   3:31 PM EDT To: Erenest Blank, NP Subject: Referral                                       Patient has moved to [facility] and is requesting a referral to [facility] so she can receive care closer to home. I'm not sure what the process for this is here in our office.

## 2023-01-03 NOTE — Telephone Encounter (Signed)
Pt advised and states understanding

## 2023-01-15 DIAGNOSIS — K760 Fatty (change of) liver, not elsewhere classified: Secondary | ICD-10-CM | POA: Diagnosis not present

## 2023-01-15 DIAGNOSIS — E7800 Pure hypercholesterolemia, unspecified: Secondary | ICD-10-CM | POA: Diagnosis not present

## 2023-01-15 DIAGNOSIS — J4540 Moderate persistent asthma, uncomplicated: Secondary | ICD-10-CM | POA: Diagnosis not present

## 2023-01-15 DIAGNOSIS — J302 Other seasonal allergic rhinitis: Secondary | ICD-10-CM | POA: Diagnosis not present

## 2023-01-15 DIAGNOSIS — R739 Hyperglycemia, unspecified: Secondary | ICD-10-CM | POA: Diagnosis not present

## 2023-02-01 DIAGNOSIS — H524 Presbyopia: Secondary | ICD-10-CM | POA: Diagnosis not present

## 2023-02-01 DIAGNOSIS — H5203 Hypermetropia, bilateral: Secondary | ICD-10-CM | POA: Diagnosis not present

## 2023-02-01 DIAGNOSIS — Z79899 Other long term (current) drug therapy: Secondary | ICD-10-CM | POA: Diagnosis not present

## 2023-02-01 DIAGNOSIS — H52203 Unspecified astigmatism, bilateral: Secondary | ICD-10-CM | POA: Diagnosis not present

## 2023-02-01 DIAGNOSIS — H25813 Combined forms of age-related cataract, bilateral: Secondary | ICD-10-CM | POA: Diagnosis not present

## 2023-02-05 ENCOUNTER — Encounter (INDEPENDENT_AMBULATORY_CARE_PROVIDER_SITE_OTHER): Payer: Self-pay | Admitting: Family Medicine

## 2023-02-05 ENCOUNTER — Telehealth (INDEPENDENT_AMBULATORY_CARE_PROVIDER_SITE_OTHER): Payer: 59 | Admitting: Family Medicine

## 2023-02-05 DIAGNOSIS — Z6841 Body Mass Index (BMI) 40.0 and over, adult: Secondary | ICD-10-CM | POA: Diagnosis not present

## 2023-02-05 DIAGNOSIS — E88819 Insulin resistance, unspecified: Secondary | ICD-10-CM | POA: Diagnosis not present

## 2023-02-05 DIAGNOSIS — E669 Obesity, unspecified: Secondary | ICD-10-CM | POA: Diagnosis not present

## 2023-02-05 DIAGNOSIS — M069 Rheumatoid arthritis, unspecified: Secondary | ICD-10-CM | POA: Diagnosis not present

## 2023-02-05 MED ORDER — OZEMPIC (0.25 OR 0.5 MG/DOSE) 2 MG/3ML ~~LOC~~ SOPN: 0.5000 mg | PEN_INJECTOR | SUBCUTANEOUS | 0 refills | Status: AC

## 2023-02-06 NOTE — Progress Notes (Unsigned)
TeleHealth Visit:  Due to the COVID-19 pandemic, this visit was completed with telemedicine (audio/video) technology to reduce patient and provider exposure as well as to preserve personal protective equipment.   Joy [name] has verbally consented to this TeleHealth visit. The patient is located at home, the provider is located at the [facility] office. The participants in this visit include the listed provider and patient. The visit was conducted today via Mychart video.   Chief Complaint: OBESITY Joy [name] is here to discuss her progress with her obesity treatment plan along with follow-up of her obesity related diagnoses. Joy [name] is on the Category 3 Plan and states she is following her eating plan approximately 100% of the time. Joy [name] states she is at the gym for 60 minutes 5 times per week.  Today's visit was #: 46 Starting weight: 268 lbs Starting date: 12/16/2019  Interim History: Patient switched to Ozempic 0.5 mg and is feeling good hunger control and easier to find than Mounjaro.  She is wondering if there are options down near [place] for clinics to explore.  She is sticking with category 3 most exclusively.  Occasional hunger.  Found new PCP.  Exercising 60 minutes 5 times per week.  Doing 30-minute weights and 30-minute cardio at the gym.  Subjective:   1. Insulin resistance Patient is on Ozempic 0.5 mg now, occasional hunger between lunch and dinner.  Last A1c was 5.2 in early June.  2. Rheumatoid arthritis, involving unspecified site, unspecified whether rheumatoid factor present Joy [name]) Patient sees new doctor on July 12.  She is on Plaquenil daily.  RBC decreased, H/H decreased, MCV within normal limits.  Assessment/Plan:   1. Insulin resistance Patient will continue Ozempic 0.5 mg subcu weekly, and we will refill for 1 month.  - Semaglutide,0.25 or 0.5MG /DOS, (OZEMPIC, 0.25 OR 0.5 MG/DOSE,) 2 MG/3ML SOPN; Inject 0.5 mg into the skin once a week.   Dispense: 3 mL; Refill: 0  2. Rheumatoid arthritis, involving unspecified site, unspecified whether rheumatoid factor present [place] [name]) We will follow-up at patient's next appointments for any new recommendations.  3. BMI 40.0-44.9, adult (Joy [name])  4. Obesity with starting BMI of 46.0 Joy [name] is currently in the action stage of change. As such, her goal is to continue with weight loss efforts. She has agreed to the Category 3 Plan.   Exercise goals: As is.   Behavioral modification strategies: increasing lean protein intake, meal planning and cooking strategies, keeping healthy foods in the home, and planning for success.  Joy [name] has agreed to follow-up with our clinic in 4 weeks. She was informed of the importance of frequent follow-up visits to maximize her success with intensive lifestyle modifications for her multiple health conditions.  Objective:   VITALS: Per patient if applicable, see vitals. GENERAL: Alert and in no acute distress. CARDIOPULMONARY: No increased WOB. Speaking in clear sentences.  PSYCH: Pleasant and cooperative. Speech normal rate and rhythm. Affect is appropriate. Insight and judgement are appropriate. Attention is focused, linear, and appropriate.  NEURO: Oriented as arrived to appointment on time with no prompting.   Lab Results  Component Value Date   CREATININE 1.09 (H) 10/24/2022   BUN 13 10/24/2022   NA 143 10/24/2022   K 3.6 10/24/2022   CL 104 10/24/2022   CO2 31 10/24/2022   Lab Results  Component Value Date   ALT 10 10/24/2022   AST 15 10/24/2022   ALKPHOS 95 10/24/2022   BILITOT 0.3 10/24/2022   Lab Results  Component  Value Date   HGBA1C 5.3 09/07/2022   HGBA1C 5.1 01/11/2022   HGBA1C 5.3 09/20/2021   HGBA1C 5.4 05/24/2021   HGBA1C 5.3 02/07/2021   Lab Results  Component Value Date   INSULIN 6.9 09/07/2022   INSULIN 14.1 01/11/2022   INSULIN 12.6 05/24/2021   INSULIN 7.1 02/07/2021   INSULIN 10.9 09/28/2020   Lab Results   Component Value Date   TSH 2.100 12/16/2019   Lab Results  Component Value Date   CHOL 184 09/07/2022   HDL 43 09/07/2022   LDLCALC 126 (H) 09/07/2022   TRIG 79 09/07/2022   CHOLHDL 3.7 02/07/2021   Lab Results  Component Value Date   VD25OH 50.6 09/07/2022   VD25OH 50.4 01/11/2022   VD25OH 58.3 05/24/2021   Lab Results  Component Value Date   WBC 7.2 12/26/2022   HGB 11.3 (L) 12/26/2022   HCT 35.9 (L) 12/26/2022   MCV 94.0 12/26/2022   PLT 221 12/26/2022   Lab Results  Component Value Date   IRON 55 12/26/2022   TIBC 270 12/26/2022   FERRITIN 301 12/26/2022    Attestation Statements:   Reviewed by clinician on day of visit: allergies, medications, problem list, medical history, surgical history, family history, social history, and previous encounter notes.   I, Burt Knack, am acting as transcriptionist for Reuben Likes, MD.  I have reviewed the above documentation for accuracy and completeness, and I agree with the above. - Reuben Likes, MD

## 2023-02-18 ENCOUNTER — Other Ambulatory Visit: Payer: Self-pay | Admitting: Family

## 2023-02-18 DIAGNOSIS — I10 Essential (primary) hypertension: Secondary | ICD-10-CM

## 2023-02-23 DIAGNOSIS — M0609 Rheumatoid arthritis without rheumatoid factor, multiple sites: Secondary | ICD-10-CM | POA: Diagnosis not present

## 2023-02-23 DIAGNOSIS — Z79899 Other long term (current) drug therapy: Secondary | ICD-10-CM | POA: Diagnosis not present

## 2023-02-23 DIAGNOSIS — M159 Polyosteoarthritis, unspecified: Secondary | ICD-10-CM | POA: Diagnosis not present

## 2023-02-26 DIAGNOSIS — H1010 Acute atopic conjunctivitis, unspecified eye: Secondary | ICD-10-CM | POA: Diagnosis not present

## 2023-02-26 DIAGNOSIS — R21 Rash and other nonspecific skin eruption: Secondary | ICD-10-CM | POA: Diagnosis not present

## 2023-02-26 DIAGNOSIS — J309 Allergic rhinitis, unspecified: Secondary | ICD-10-CM | POA: Diagnosis not present

## 2023-03-05 ENCOUNTER — Ambulatory Visit (INDEPENDENT_AMBULATORY_CARE_PROVIDER_SITE_OTHER): Payer: BC Managed Care – PPO | Admitting: Family Medicine

## 2023-03-09 ENCOUNTER — Telehealth: Payer: Self-pay | Admitting: Family

## 2023-03-09 NOTE — Telephone Encounter (Signed)
This was sent on 02/19/2023:  Disp Refills Start End   lisinopril (ZESTRIL) 5 MG tablet 90 tablet 0 02/19/2023 --   Sig: TAKE ONE TABLET (5 MG TOTAL) BY MOUTH DAILY AT 9 AM   Sent to pharmacy as: lisinopril (ZESTRIL) 5 MG tablet   E-Prescribing Status: Receipt confirmed by pharmacy (02/19/2023  9:11 AM EDT)    [facility] and let them know.

## 2023-03-09 NOTE — Telephone Encounter (Signed)
[facility] called and would like to refill med  lisinopril (ZESTRIL) 5 MG tablet   Please call back at [phone]

## 2023-03-21 ENCOUNTER — Ambulatory Visit (INDEPENDENT_AMBULATORY_CARE_PROVIDER_SITE_OTHER): Payer: BC Managed Care – PPO | Admitting: Family Medicine

## 2023-03-21 ENCOUNTER — Encounter: Payer: 59 | Admitting: Family

## 2023-04-17 DIAGNOSIS — R7303 Prediabetes: Secondary | ICD-10-CM | POA: Diagnosis not present

## 2023-04-17 DIAGNOSIS — E538 Deficiency of other specified B group vitamins: Secondary | ICD-10-CM | POA: Diagnosis not present

## 2023-04-17 DIAGNOSIS — E88819 Insulin resistance, unspecified: Secondary | ICD-10-CM | POA: Diagnosis not present

## 2023-04-17 DIAGNOSIS — R600 Localized edema: Secondary | ICD-10-CM | POA: Diagnosis not present

## 2023-04-17 DIAGNOSIS — R079 Chest pain, unspecified: Secondary | ICD-10-CM | POA: Diagnosis not present

## 2023-04-17 DIAGNOSIS — K219 Gastro-esophageal reflux disease without esophagitis: Secondary | ICD-10-CM | POA: Diagnosis not present

## 2023-05-10 ENCOUNTER — Other Ambulatory Visit: Payer: Self-pay | Admitting: Family

## 2023-05-10 DIAGNOSIS — I10 Essential (primary) hypertension: Secondary | ICD-10-CM

## 2023-06-08 ENCOUNTER — Other Ambulatory Visit: Payer: Self-pay | Admitting: Family

## 2023-06-08 DIAGNOSIS — I10 Essential (primary) hypertension: Secondary | ICD-10-CM

## 2023-06-26 DIAGNOSIS — M0609 Rheumatoid arthritis without rheumatoid factor, multiple sites: Secondary | ICD-10-CM | POA: Diagnosis not present

## 2023-06-26 DIAGNOSIS — Z79899 Other long term (current) drug therapy: Secondary | ICD-10-CM | POA: Diagnosis not present

## 2023-06-26 DIAGNOSIS — M159 Polyosteoarthritis, unspecified: Secondary | ICD-10-CM | POA: Diagnosis not present

## 2023-07-09 ENCOUNTER — Other Ambulatory Visit: Payer: Self-pay | Admitting: Family

## 2023-07-09 DIAGNOSIS — I10 Essential (primary) hypertension: Secondary | ICD-10-CM

## 2023-07-17 DIAGNOSIS — I10 Essential (primary) hypertension: Secondary | ICD-10-CM | POA: Diagnosis not present

## 2023-07-17 DIAGNOSIS — R7303 Prediabetes: Secondary | ICD-10-CM | POA: Diagnosis not present

## 2023-07-17 DIAGNOSIS — M170 Bilateral primary osteoarthritis of knee: Secondary | ICD-10-CM | POA: Diagnosis not present

## 2023-07-17 DIAGNOSIS — M0600 Rheumatoid arthritis without rheumatoid factor, unspecified site: Secondary | ICD-10-CM | POA: Diagnosis not present

## 2023-08-24 DIAGNOSIS — Z7985 Long-term (current) use of injectable non-insulin antidiabetic drugs: Secondary | ICD-10-CM | POA: Diagnosis not present

## 2023-08-24 DIAGNOSIS — Z79899 Other long term (current) drug therapy: Secondary | ICD-10-CM | POA: Diagnosis not present

## 2023-08-24 DIAGNOSIS — Z7969 Long term (current) use of other immunomodulators and immunosuppressants: Secondary | ICD-10-CM | POA: Diagnosis not present

## 2023-08-24 DIAGNOSIS — S61206A Unspecified open wound of right little finger without damage to nail, initial encounter: Secondary | ICD-10-CM | POA: Diagnosis not present

## 2023-08-24 DIAGNOSIS — S61216A Laceration without foreign body of right little finger without damage to nail, initial encounter: Secondary | ICD-10-CM | POA: Diagnosis not present

## 2023-08-24 DIAGNOSIS — Z23 Encounter for immunization: Secondary | ICD-10-CM | POA: Diagnosis not present

## 2023-08-27 ENCOUNTER — Telehealth: Payer: Self-pay

## 2023-08-27 NOTE — Transitions of Care (Post Inpatient/ED Visit) (Signed)
 08/27/2023  Name: Joy [name] MRN: [id number] DOB: 27-May-1966  Today's TOC FU Call Status: Today's TOC FU Call Status:: Successful TOC FU Call Completed TOC FU Call Complete Date: 08/27/23 Patient's Name and Date of Birth confirmed.  Transition Care Management Follow-up Telephone Call Date of Discharge: 08/24/23 Discharge Facility: Other ([facility]) Name of Other ([facility]) Discharge Facility: [facility] Type of Discharge: Emergency Department Reason for ED Visit: Other: How have you been since you were released from the hospital?: Better Any questions or concerns?: No  Items Reviewed: Did you receive and understand the discharge instructions provided?: Yes Medications obtained,verified, and reconciled?: Yes (Medications Reviewed) Any new allergies since your discharge?: No Dietary orders reviewed?: NA Do you have support at home?: Yes  Medications Reviewed Today: Medications Reviewed Today     Reviewed by Lang Avelina PARAS, CMA (Certified Medical Assistant) on 08/27/23 at 1545  Med List Status: <None>   Medication Order Taking? Sig Documenting Provider Last Dose Status Informant  acetaminophen  (TYLENOL ) 500 MG tablet [id number] No Take 1,000 mg by mouth daily as needed for headache. [provider] Taking Active Self, Pharmacy Records  albuterol  (VENTOLIN  HFA) [phone] Base) MCG/ACT inhaler [id number] No Inhale 2 puffs into the lungs as needed for wheezing or shortness of breath. Daryl Setter, NP Taking Active   atorvastatin  (LIPITOR) 20 MG tablet [id number] No TAKE 1 TABLET(20 MG) BY MOUTH DAILY O'Sullivan, Melissa, NP Taking Active   betamethasone  dipropionate 0.05 % cream [id number]  Apply topically 2 (two) times daily. Daryl Setter, NP  Active   cetirizine  (ZYRTEC  ALLERGY) 10 MG tablet [id number] No Take 1 tablet (10 mg total) by mouth daily. Carlyle Lenis, MD Taking Active Self, Pharmacy Records  Cholecalciferol (VITAMIN  D3) 125 MCG (5000 UT) CAPS [id number] No Take 1 capsule (5,000 Units total) by mouth daily. Therisa Arabia, PA-C Taking Active Self, Pharmacy Records  dicyclomine  (BENTYL ) 10 MG capsule [id number] No Take 1 capsule (10 mg total) by mouth 4 (four) times daily -  before meals and at bedtime. Eda Iha, MD Taking Active   ferrous sulfate  325 (65 FE) MG tablet [id number] No Take 1 tablet (325 mg total) by mouth every other day. Daryl Setter, NP Taking Active   fluticasone  (FLONASE ) 50 MCG/ACT nasal spray [id number] No Place 2 sprays into both nostrils daily. Carlyle Lenis, MD Taking Active Self, Pharmacy Records  Fluticasone  Propionate, Inhal, (FLOVENT  DISKUS) 50 MCG/ACT AEPB [id number] No Inhale 2 puffs into the lungs 2 (two) times daily. 50 mcg O'Sullivan, Melissa, NP Taking Active Self, Pharmacy Records  hydrocortisone  2.5 % cream [id number] No Apply 1 Application topically as needed. [provider] Taking Active   hydroxychloroquine (PLAQUENIL) 200 MG tablet [id number] No Take 200 mg by mouth daily. [provider] Taking Active Self, Pharmacy Records  leflunomide (ARAVA) 20 MG tablet [id number] No Take 20 mg by mouth daily. [provider] Taking Active Self, Pharmacy Records  lisinopril  (ZESTRIL ) 5 MG tablet [id number]  TAKE ONE TABLET (5MG  TOTAL) BY MOUTH DAILY AT 9 AM O'Sullivan, Melissa, NP  Active   Multiple Vitamins-Minerals (WOMENS MULTIVITAMIN PO) [id number] No Take 1 tablet by mouth daily. [provider] Taking Active Self, Pharmacy Records  Olopatadine HCl 0.7 % SOLN [id number] No Apply 1 drop to eye as needed. [provider] Taking Active   omeprazole  (PRILOSEC) 40 MG capsule [id number] No Take 1 capsule (40 mg total) by mouth in the morning and at bedtime. Eda Iha, MD Taking Active   Polyvinyl  Alcohol-Povidone (REFRESH OP) [id number] No Place 2 drops into both eyes daily.  Patient not taking: Reported on 12/26/2022   [provider] Not Taking Active Self, Pharmacy Records  predniSONE  (DELTASONE ) 5 MG tablet [id number] No Take 1 tablet by mouth daily.  Patient not taking: Reported on 12/26/2022   [provider] Not Taking Active            Med Note CECILLIA DIAMOND PARAS   Tue Dec 26, 2022  1:53 PM) 12/26/2022 To pick up Rx today.  Semaglutide ,0.25 or 0.5MG /DOS, (OZEMPIC , 0.25 OR 0.5 MG/DOSE,) 2 MG/3ML SOPN [id number]  Inject 0.5 mg into the skin once a week. [place] Adelita PENNER, MD  Active   traMADol DANNY) 50 MG tablet [id number] No Take 50 mg by mouth daily as needed for moderate pain. [provider] Taking Active Self, Pharmacy Records            Home Care and Equipment/Supplies: Were Home Health Services Ordered?: NA Any new equipment or medical supplies ordered?: NA  Functional Questionnaire: Do you need assistance with bathing/showering or dressing?: No Do you need assistance with meal preparation?: No Do you need assistance with eating?: No Do you have difficulty maintaining continence: No Do you need assistance with getting out of bed/getting out of a chair/moving?: No Do you have difficulty managing or taking your medications?: No  Follow up appointments reviewed: PCP Follow-up appointment confirmed?: No MD Provider Line Number:[phone] Given: Yes Specialist Hospital Follow-up appointment confirmed?: NA Do you need transportation to your follow-up appointment?: No Do you understand care options if your condition(s) worsen?: Yes-patient verbalized understanding    SIGNATURE Avelina Essex, CMA (AAMA)  [facility] [phone]

## 2023-09-05 DIAGNOSIS — M0609 Rheumatoid arthritis without rheumatoid factor, multiple sites: Secondary | ICD-10-CM | POA: Diagnosis not present

## 2023-09-05 DIAGNOSIS — E88819 Insulin resistance, unspecified: Secondary | ICD-10-CM | POA: Diagnosis not present

## 2023-09-05 DIAGNOSIS — I10 Essential (primary) hypertension: Secondary | ICD-10-CM | POA: Diagnosis not present

## 2023-09-05 DIAGNOSIS — R079 Chest pain, unspecified: Secondary | ICD-10-CM | POA: Diagnosis not present

## 2023-09-05 DIAGNOSIS — M0600 Rheumatoid arthritis without rheumatoid factor, unspecified site: Secondary | ICD-10-CM | POA: Diagnosis not present

## 2023-09-05 DIAGNOSIS — R002 Palpitations: Secondary | ICD-10-CM | POA: Diagnosis not present

## 2023-09-05 DIAGNOSIS — J4540 Moderate persistent asthma, uncomplicated: Secondary | ICD-10-CM | POA: Diagnosis not present

## 2023-09-05 DIAGNOSIS — E785 Hyperlipidemia, unspecified: Secondary | ICD-10-CM | POA: Diagnosis not present

## 2023-09-05 DIAGNOSIS — Z0000 Encounter for general adult medical examination without abnormal findings: Secondary | ICD-10-CM | POA: Diagnosis not present

## 2023-09-10 DIAGNOSIS — R002 Palpitations: Secondary | ICD-10-CM | POA: Diagnosis not present

## 2023-09-10 DIAGNOSIS — I498 Other specified cardiac arrhythmias: Secondary | ICD-10-CM | POA: Diagnosis not present
